# Patient Record
Sex: Female | Born: 1935
Health system: Southern US, Community
[De-identification: ages and names within clinical notes are randomized; demographics above are authoritative.]

## PROBLEM LIST (undated history)

## (undated) DIAGNOSIS — E079 Disorder of thyroid, unspecified: Secondary | ICD-10-CM

## (undated) DIAGNOSIS — F0390 Unspecified dementia without behavioral disturbance: Secondary | ICD-10-CM

## (undated) HISTORY — DX: Disorder of thyroid, unspecified: E07.9

## (undated) HISTORY — PX: ABDOMINAL HYSTERECTOMY: SHX81

---

## 2002-01-10 ENCOUNTER — Other Ambulatory Visit: Admission: RE | Admit: 2002-01-10 | Discharge: 2002-01-10 | Payer: Self-pay | Admitting: Family Medicine

## 2004-07-03 ENCOUNTER — Ambulatory Visit: Payer: Self-pay | Admitting: Family Medicine

## 2005-10-12 ENCOUNTER — Ambulatory Visit: Payer: Self-pay | Admitting: Family Medicine

## 2007-02-22 ENCOUNTER — Other Ambulatory Visit: Payer: Self-pay

## 2007-02-22 ENCOUNTER — Ambulatory Visit: Payer: Self-pay | Admitting: Specialist

## 2007-03-01 ENCOUNTER — Ambulatory Visit: Payer: Self-pay | Admitting: Specialist

## 2007-06-16 ENCOUNTER — Ambulatory Visit: Payer: Self-pay | Admitting: Specialist

## 2011-03-19 ENCOUNTER — Ambulatory Visit: Payer: Self-pay | Admitting: General Surgery

## 2011-07-01 ENCOUNTER — Emergency Department: Payer: Self-pay | Admitting: *Deleted

## 2011-07-01 LAB — MAGNESIUM: Magnesium: 2 mg/dL

## 2011-07-01 LAB — COMPREHENSIVE METABOLIC PANEL
Bilirubin,Total: 0.4 mg/dL (ref 0.2–1.0)
Calcium, Total: 9.2 mg/dL (ref 8.5–10.1)
Chloride: 104 mmol/L (ref 98–107)
Co2: 25 mmol/L (ref 21–32)
EGFR (African American): >60
EGFR (Non-African Amer.): >60
Potassium: 4.5 mmol/L (ref 3.5–5.1)
SGPT (ALT): 29 U/L
Total Protein: 7.5 g/dL (ref 6.4–8.2)

## 2011-07-01 LAB — CBC
HCT: 42.3 % (ref 35.0–47.0)
HGB: 14.1 g/dL (ref 12.0–16.0)
MCH: 31.2 pg (ref 26.0–34.0)
Platelet: 247 10*3/uL (ref 150–440)
RBC: 4.52 10*6/uL (ref 3.80–5.20)
RDW: 13.9 % (ref 11.5–14.5)
WBC: 7.8 10*3/uL (ref 3.6–11.0)

## 2011-07-01 LAB — TROPONIN I: Troponin-I: <0.02 ng/mL

## 2013-08-01 ENCOUNTER — Ambulatory Visit: Payer: Self-pay | Admitting: Adult Health

## 2015-06-03 DIAGNOSIS — M9903 Segmental and somatic dysfunction of lumbar region: Secondary | ICD-10-CM | POA: Diagnosis not present

## 2015-06-03 DIAGNOSIS — M542 Cervicalgia: Secondary | ICD-10-CM | POA: Diagnosis not present

## 2015-06-03 DIAGNOSIS — M9901 Segmental and somatic dysfunction of cervical region: Secondary | ICD-10-CM | POA: Diagnosis not present

## 2015-06-03 DIAGNOSIS — M9902 Segmental and somatic dysfunction of thoracic region: Secondary | ICD-10-CM | POA: Diagnosis not present

## 2015-06-03 DIAGNOSIS — M546 Pain in thoracic spine: Secondary | ICD-10-CM | POA: Diagnosis not present

## 2015-06-03 DIAGNOSIS — M545 Low back pain: Secondary | ICD-10-CM | POA: Diagnosis not present

## 2015-06-17 DIAGNOSIS — H353131 Nonexudative age-related macular degeneration, bilateral, early dry stage: Secondary | ICD-10-CM | POA: Diagnosis not present

## 2015-06-20 DIAGNOSIS — M9903 Segmental and somatic dysfunction of lumbar region: Secondary | ICD-10-CM | POA: Diagnosis not present

## 2015-06-20 DIAGNOSIS — M542 Cervicalgia: Secondary | ICD-10-CM | POA: Diagnosis not present

## 2015-06-20 DIAGNOSIS — M9901 Segmental and somatic dysfunction of cervical region: Secondary | ICD-10-CM | POA: Diagnosis not present

## 2015-06-20 DIAGNOSIS — M545 Low back pain: Secondary | ICD-10-CM | POA: Diagnosis not present

## 2015-06-20 DIAGNOSIS — M9902 Segmental and somatic dysfunction of thoracic region: Secondary | ICD-10-CM | POA: Diagnosis not present

## 2015-06-20 DIAGNOSIS — M546 Pain in thoracic spine: Secondary | ICD-10-CM | POA: Diagnosis not present

## 2015-10-07 DIAGNOSIS — R030 Elevated blood-pressure reading, without diagnosis of hypertension: Secondary | ICD-10-CM | POA: Diagnosis not present

## 2015-10-07 DIAGNOSIS — E039 Hypothyroidism, unspecified: Secondary | ICD-10-CM | POA: Diagnosis not present

## 2015-12-11 DIAGNOSIS — D692 Other nonthrombocytopenic purpura: Secondary | ICD-10-CM | POA: Diagnosis not present

## 2015-12-11 DIAGNOSIS — C44622 Squamous cell carcinoma of skin of right upper limb, including shoulder: Secondary | ICD-10-CM | POA: Diagnosis not present

## 2015-12-11 DIAGNOSIS — X32XXXA Exposure to sunlight, initial encounter: Secondary | ICD-10-CM | POA: Diagnosis not present

## 2015-12-11 DIAGNOSIS — L570 Actinic keratosis: Secondary | ICD-10-CM | POA: Diagnosis not present

## 2015-12-11 DIAGNOSIS — Z85828 Personal history of other malignant neoplasm of skin: Secondary | ICD-10-CM | POA: Diagnosis not present

## 2015-12-11 DIAGNOSIS — Z08 Encounter for follow-up examination after completed treatment for malignant neoplasm: Secondary | ICD-10-CM | POA: Diagnosis not present

## 2015-12-11 DIAGNOSIS — C44729 Squamous cell carcinoma of skin of left lower limb, including hip: Secondary | ICD-10-CM | POA: Diagnosis not present

## 2015-12-11 DIAGNOSIS — D485 Neoplasm of uncertain behavior of skin: Secondary | ICD-10-CM | POA: Diagnosis not present

## 2015-12-16 DIAGNOSIS — H353133 Nonexudative age-related macular degeneration, bilateral, advanced atrophic without subfoveal involvement: Secondary | ICD-10-CM | POA: Diagnosis not present

## 2016-02-13 DIAGNOSIS — L905 Scar conditions and fibrosis of skin: Secondary | ICD-10-CM | POA: Diagnosis not present

## 2016-02-13 DIAGNOSIS — C44622 Squamous cell carcinoma of skin of right upper limb, including shoulder: Secondary | ICD-10-CM | POA: Diagnosis not present

## 2016-02-13 DIAGNOSIS — D0461 Carcinoma in situ of skin of right upper limb, including shoulder: Secondary | ICD-10-CM | POA: Diagnosis not present

## 2016-03-02 DIAGNOSIS — C44729 Squamous cell carcinoma of skin of left lower limb, including hip: Secondary | ICD-10-CM | POA: Diagnosis not present

## 2016-03-02 DIAGNOSIS — L905 Scar conditions and fibrosis of skin: Secondary | ICD-10-CM | POA: Diagnosis not present

## 2016-03-22 DIAGNOSIS — E039 Hypothyroidism, unspecified: Secondary | ICD-10-CM | POA: Diagnosis not present

## 2016-03-22 DIAGNOSIS — M810 Age-related osteoporosis without current pathological fracture: Secondary | ICD-10-CM | POA: Diagnosis not present

## 2016-03-22 DIAGNOSIS — Z0001 Encounter for general adult medical examination with abnormal findings: Secondary | ICD-10-CM | POA: Diagnosis not present

## 2016-06-09 DIAGNOSIS — H353133 Nonexudative age-related macular degeneration, bilateral, advanced atrophic without subfoveal involvement: Secondary | ICD-10-CM | POA: Diagnosis not present

## 2016-06-29 DIAGNOSIS — E559 Vitamin D deficiency, unspecified: Secondary | ICD-10-CM | POA: Diagnosis not present

## 2016-06-29 DIAGNOSIS — E039 Hypothyroidism, unspecified: Secondary | ICD-10-CM | POA: Diagnosis not present

## 2016-06-29 DIAGNOSIS — Z0001 Encounter for general adult medical examination with abnormal findings: Secondary | ICD-10-CM | POA: Diagnosis not present

## 2016-10-05 DIAGNOSIS — R69 Illness, unspecified: Secondary | ICD-10-CM | POA: Diagnosis not present

## 2016-12-15 DIAGNOSIS — H353133 Nonexudative age-related macular degeneration, bilateral, advanced atrophic without subfoveal involvement: Secondary | ICD-10-CM | POA: Diagnosis not present

## 2017-03-24 DIAGNOSIS — D485 Neoplasm of uncertain behavior of skin: Secondary | ICD-10-CM | POA: Diagnosis not present

## 2017-03-24 DIAGNOSIS — E039 Hypothyroidism, unspecified: Secondary | ICD-10-CM | POA: Diagnosis not present

## 2017-03-24 DIAGNOSIS — Z0001 Encounter for general adult medical examination with abnormal findings: Secondary | ICD-10-CM | POA: Diagnosis not present

## 2017-06-20 DIAGNOSIS — H353211 Exudative age-related macular degeneration, right eye, with active choroidal neovascularization: Secondary | ICD-10-CM | POA: Diagnosis not present

## 2017-06-28 DIAGNOSIS — H43813 Vitreous degeneration, bilateral: Secondary | ICD-10-CM | POA: Diagnosis not present

## 2017-06-28 DIAGNOSIS — H353211 Exudative age-related macular degeneration, right eye, with active choroidal neovascularization: Secondary | ICD-10-CM | POA: Diagnosis not present

## 2017-07-05 ENCOUNTER — Other Ambulatory Visit: Payer: Self-pay | Admitting: Internal Medicine

## 2017-08-04 ENCOUNTER — Encounter: Payer: Self-pay | Admitting: Podiatry

## 2017-08-04 ENCOUNTER — Ambulatory Visit: Payer: Medicare HMO | Admitting: Podiatry

## 2017-08-04 DIAGNOSIS — M79675 Pain in left toe(s): Secondary | ICD-10-CM

## 2017-08-04 DIAGNOSIS — B351 Tinea unguium: Secondary | ICD-10-CM | POA: Diagnosis not present

## 2017-08-04 DIAGNOSIS — M79674 Pain in right toe(s): Secondary | ICD-10-CM

## 2017-08-04 NOTE — Progress Notes (Signed)
Complaint:  Visit Type: Patient presents  to my office for  preventative foot care services. Complaint: Patient states" my nails have grown long and thick and become painful to walk and wear shoes" . The patient presents for preventative foot care services. No changes to ROS  Podiatric Exam: Vascular: dorsalis pedis and posterior tibial pulses are weakly  palpable bilateral. Capillary return is immediate. Cold feet. Skin turgor WNL  Sensorium: Normal Semmes Weinstein monofilament test. Normal tactile sensation bilaterally. Nail Exam: Pt has thick disfigured discolored nails with subungual debris noted bilateral entire nail hallux through fifth toenails with her left hallux thickest. Ulcer Exam: There is no evidence of ulcer or pre-ulcerative changes or infection. Orthopedic Exam: Muscle tone and strength are WNL. No limitations in general ROM. No crepitus or effusions noted. Foot type and digits show no abnormalities. Bony prominences are unremarkable. Skin: No Porokeratosis. No infection or ulcers  Diagnosis:  Onychomycosis, , Pain in right toe, pain in left toes  Treatment & Plan Procedures and Treatment: Consent by patient was obtained for treatment procedures.   Debridement of mycotic and hypertrophic toenails, 1 through 5 bilateral and clearing of subungual debris. No ulceration, no infection noted.  Return Visit-Office Procedure: Patient instructed to return to the office for a follow up visit 3 months for continued evaluation and treatment.    Gardiner Barefoot DPM

## 2017-08-17 ENCOUNTER — Ambulatory Visit: Payer: Self-pay | Admitting: Podiatry

## 2017-09-13 DIAGNOSIS — H353211 Exudative age-related macular degeneration, right eye, with active choroidal neovascularization: Secondary | ICD-10-CM | POA: Diagnosis not present

## 2017-09-13 DIAGNOSIS — Z961 Presence of intraocular lens: Secondary | ICD-10-CM | POA: Diagnosis not present

## 2017-09-13 DIAGNOSIS — H353221 Exudative age-related macular degeneration, left eye, with active choroidal neovascularization: Secondary | ICD-10-CM | POA: Diagnosis not present

## 2017-10-17 DIAGNOSIS — H353211 Exudative age-related macular degeneration, right eye, with active choroidal neovascularization: Secondary | ICD-10-CM | POA: Diagnosis not present

## 2017-10-17 DIAGNOSIS — H353221 Exudative age-related macular degeneration, left eye, with active choroidal neovascularization: Secondary | ICD-10-CM | POA: Diagnosis not present

## 2017-10-24 DIAGNOSIS — R69 Illness, unspecified: Secondary | ICD-10-CM | POA: Diagnosis not present

## 2017-11-07 ENCOUNTER — Ambulatory Visit: Payer: Medicare HMO | Admitting: Podiatry

## 2017-11-22 DIAGNOSIS — H353221 Exudative age-related macular degeneration, left eye, with active choroidal neovascularization: Secondary | ICD-10-CM | POA: Diagnosis not present

## 2017-11-22 DIAGNOSIS — H353211 Exudative age-related macular degeneration, right eye, with active choroidal neovascularization: Secondary | ICD-10-CM | POA: Diagnosis not present

## 2017-12-19 ENCOUNTER — Telehealth: Payer: Self-pay

## 2017-12-19 DIAGNOSIS — M6281 Muscle weakness (generalized): Secondary | ICD-10-CM | POA: Diagnosis not present

## 2017-12-19 DIAGNOSIS — R2689 Other abnormalities of gait and mobility: Secondary | ICD-10-CM | POA: Diagnosis not present

## 2017-12-19 DIAGNOSIS — R278 Other lack of coordination: Secondary | ICD-10-CM | POA: Diagnosis not present

## 2017-12-19 NOTE — Telephone Encounter (Signed)
Physical theraphy called that pt bilateral leg swelling I gave tat for appt

## 2017-12-20 ENCOUNTER — Ambulatory Visit (INDEPENDENT_AMBULATORY_CARE_PROVIDER_SITE_OTHER): Payer: Medicare HMO | Admitting: Adult Health

## 2017-12-20 ENCOUNTER — Encounter: Payer: Self-pay | Admitting: Adult Health

## 2017-12-20 VITALS — BP 142/88 | HR 119 | Temp 97.9°F | Resp 16 | Ht 60.0 in | Wt 110.4 lb

## 2017-12-20 DIAGNOSIS — R600 Localized edema: Secondary | ICD-10-CM

## 2017-12-20 DIAGNOSIS — E039 Hypothyroidism, unspecified: Secondary | ICD-10-CM | POA: Diagnosis not present

## 2017-12-20 DIAGNOSIS — D692 Other nonthrombocytopenic purpura: Secondary | ICD-10-CM

## 2017-12-20 DIAGNOSIS — Z0001 Encounter for general adult medical examination with abnormal findings: Secondary | ICD-10-CM

## 2017-12-20 DIAGNOSIS — R0600 Dyspnea, unspecified: Secondary | ICD-10-CM

## 2017-12-20 DIAGNOSIS — R0609 Other forms of dyspnea: Secondary | ICD-10-CM

## 2017-12-20 NOTE — Progress Notes (Addendum)
[facility] [place], [place] [address]  Internal MEDICINE  Office Visit Note  Patient Name: Aimee Kirk  [id number]  [id number]  Date of Service: 02/07/2018  Chief Complaint  Patient presents with  . Edema     both legs , started in the last week, ankles have been swelling for about a month  . Shortness of Breath    palpitations     HPI Pt is here for a sick visit. Family in exam room.  They reports that physical therapy was working with her a few days ago.  They noticed bilateral feet/ankle swelling. She does admit to mild  chest pressure She has sob with exertion, heart rate is elevated here in the office   Current Medication:  Outpatient Encounter Medications as of 12/20/2017  Medication Sig  . ARMOUR THYROID 60 MG tablet TAKE 1 TABLET DAILY 1 HOUR BEFORE BREAKFAST  . Multiple Vitamin (MULTIVITAMIN) tablet Take 1 tablet by mouth daily.   No facility-administered encounter medications on file as of 12/20/2017.     Medical History: Past Medical History:  Diagnosis Date  . Thyroid disease     Vital Signs: BP (!) 142/88   Pulse (!) 119   Temp 97.9 F (36.6 C)   Resp 16   Ht 5' (1.524 m)   Wt 110 lb 6.4 oz (50.1 kg)   SpO2 95%   BMI 21.56 kg/m    Review of Systems  Constitutional: Negative for chills, fatigue and unexpected weight change.  HENT: Negative for congestion, rhinorrhea, sneezing and sore throat.   Eyes: Negative for photophobia, pain and redness.  Respiratory: Negative for cough, chest tightness and shortness of breath.   Cardiovascular: Negative for chest pain and palpitations.  Gastrointestinal: Negative for abdominal pain, constipation, diarrhea, nausea and vomiting.  Endocrine: Negative.   Genitourinary: Negative for dysuria and frequency.  Musculoskeletal: Negative for arthralgias, back pain, joint swelling and neck pain.  Skin: Negative for rash.       Swelling to bilateral lower extremities.    Allergic/Immunologic: Negative.   Neurological: Negative for tremors and numbness.  Hematological: Negative for adenopathy. Does not bruise/bleed easily.  Psychiatric/Behavioral: Negative for behavioral problems and sleep disturbance. The patient is not nervous/anxious.     Physical Exam  Constitutional: She is oriented to person, place, and time. She appears well-developed and well-nourished. No distress.  HENT:  Head: Normocephalic and atraumatic.  Mouth/Throat: Oropharynx is clear and moist. No oropharyngeal exudate.  Eyes: Pupils are equal, round, and reactive to light. EOM are normal.  Neck: Normal range of motion. Neck supple. No JVD present. No tracheal deviation present. No thyromegaly present.  Cardiovascular: Regular rhythm and normal heart sounds. Exam reveals no gallop and no friction rub.  No murmur heard. TACHYCARDIA  Pulmonary/Chest: Effort normal and breath sounds normal. No respiratory distress. She has no wheezes. She has no rales. She exhibits no tenderness.  Abdominal: Soft. There is no tenderness. There is no guarding.  Musculoskeletal: Normal range of motion.  Lymphadenopathy:    She has no cervical adenopathy.  Neurological: She is alert and oriented to person, place, and time. No cranial nerve deficit.  Skin: Skin is warm and dry. She is not diaphoretic.  Senile Purpura noted to all extremities.  1+ Pitting edema noted to BLE.   Psychiatric: She has a normal mood and affect. Her behavior is normal. Judgment and thought content normal.  Nursing note and vitals reviewed.  Assessment/Plan: 1. Pedal edema Keep  feet elevated when sitting.  Use compression hose if possible.pt has tachycardia refer to cardiology if needed.   2. Senile purpura ([place]) Protect skin as much as possible.    3. Hypothyroidism, unspecified type - TSH - T4, free  Labs ordered at patient request.  - CBC with Differential/Platelet - Lipid Panel With LDL/HDL Ratio - Comprehensive  metabolic panel  5. Dyspnea on exertion - Pt has tachycardia, needs evaluation of LV function, will order  ECHOCARDIOGRAM COMPLETE; Future General Counseling: Eileen verbalizes understanding of the findings of todays visit and agrees with plan of treatment. I have discussed any further diagnostic evaluation that may be needed or ordered today. We also reviewed her medications today. she has been encouraged to call the office with any questions or concerns that should arise related to todays visit.   Orders Placed This Encounter  Procedures  . CBC with Differential/Platelet  . Lipid Panel With LDL/HDL Ratio  . TSH  . T4, free  . Comprehensive metabolic panel  . ECHOCARDIOGRAM COMPLETE    Time spent: 25 Minutes  This patient was seen by Orson Gear AGNP-C in Collaboration with Dr Lavera Guise as a part of collaborative care agreement

## 2017-12-20 NOTE — Patient Instructions (Signed)
Edema Edema is when you have too much fluid in your body or under your skin. Edema may make your legs, feet, and ankles swell up. Swelling is also common in looser tissues, like around your eyes. This is a common condition. It gets more common as you get older. There are many possible causes of edema. Eating too much salt (sodium) and being on your feet or sitting for a long time can cause edema in your legs, feet, and ankles. Hot weather may make edema worse. Edema is usually painless. Your skin may look swollen or shiny. Follow these instructions at home:  Keep the swollen body part raised (elevated) above the level of your heart when you are sitting or lying down.  Do not sit still or stand for a long time.  Do not wear tight clothes. Do not wear garters on your upper legs.  Exercise your legs. This can help the swelling go down.  Wear elastic bandages or support stockings as told by your doctor.  Eat a low-salt (low-sodium) diet to reduce fluid as told by your doctor.  Depending on the cause of your swelling, you may need to limit how much fluid you drink (fluid restriction).  Take over-the-counter and prescription medicines only as told by your doctor. Contact a doctor if:  Treatment is not working.  You have heart, liver, or kidney disease and have symptoms of edema.  You have sudden and unexplained weight gain. Get help right away if:  You have shortness of breath or chest pain.  You cannot breathe when you lie down.  You have pain, redness, or warmth in the swollen areas.  You have heart, liver, or kidney disease and get edema all of a sudden.  You have a fever and your symptoms get worse all of a sudden. Summary  Edema is when you have too much fluid in your body or under your skin.  Edema may make your legs, feet, and ankles swell up. Swelling is also common in looser tissues, like around your eyes.  Raise (elevate) the swollen body part above the level of your  heart when you are sitting or lying down.  Follow your doctor's instructions about diet and how much fluid you can drink (fluid restriction). This information is not intended to replace advice given to you by your health care provider. Make sure you discuss any questions you have with your health care provider. Document Released: 10/20/2007 Document Revised: 05/21/2016 Document Reviewed: 05/21/2016 Elsevier Interactive Patient Education  2017 Elsevier Inc.  

## 2017-12-21 ENCOUNTER — Telehealth: Payer: Self-pay

## 2017-12-21 DIAGNOSIS — Z13220 Encounter for screening for lipoid disorders: Secondary | ICD-10-CM | POA: Diagnosis not present

## 2017-12-21 DIAGNOSIS — R5381 Other malaise: Secondary | ICD-10-CM | POA: Diagnosis not present

## 2017-12-21 DIAGNOSIS — M6281 Muscle weakness (generalized): Secondary | ICD-10-CM | POA: Diagnosis not present

## 2017-12-21 DIAGNOSIS — R2689 Other abnormalities of gait and mobility: Secondary | ICD-10-CM | POA: Diagnosis not present

## 2017-12-21 DIAGNOSIS — E079 Disorder of thyroid, unspecified: Secondary | ICD-10-CM | POA: Diagnosis not present

## 2017-12-21 DIAGNOSIS — E756 Lipid storage disorder, unspecified: Secondary | ICD-10-CM | POA: Diagnosis not present

## 2017-12-21 DIAGNOSIS — Z0001 Encounter for general adult medical examination with abnormal findings: Secondary | ICD-10-CM | POA: Diagnosis not present

## 2017-12-21 DIAGNOSIS — R5383 Other fatigue: Secondary | ICD-10-CM | POA: Diagnosis not present

## 2017-12-21 DIAGNOSIS — R278 Other lack of coordination: Secondary | ICD-10-CM | POA: Diagnosis not present

## 2017-12-21 NOTE — Telephone Encounter (Signed)
PHYSICAL THERAPY ([place] [phone]) PT HEART RATE IS HIGH 101 AS PER [facility] HOLD FOR NOW FOR PHYSICAL THERAPY NOW UNTIL WE GETS ECHO RESULT BACK AND I ADVISED JENNIFER THAT

## 2017-12-22 ENCOUNTER — Encounter: Payer: Self-pay | Admitting: Adult Health

## 2017-12-22 LAB — CBC WITH DIFFERENTIAL/PLATELET
BASOS ABS: 0 10*3/uL (ref 0.0–0.2)
Basos: 0 %
EOS (ABSOLUTE): 0 10*3/uL (ref 0.0–0.4)
Eos: 1 %
Hematocrit: 37.7 % (ref 34.0–46.6)
Hemoglobin: 12.4 g/dL (ref 11.1–15.9)
Immature Grans (Abs): 0 10*3/uL (ref 0.0–0.1)
Immature Granulocytes: 0 %
LYMPHS ABS: 1 10*3/uL (ref 0.7–3.1)
Lymphs: 12 %
MCH: 30.8 pg (ref 26.6–33.0)
MCHC: 32.9 g/dL (ref 31.5–35.7)
MCV: 94 fL (ref 79–97)
Monocytes Absolute: 0.6 10*3/uL (ref 0.1–0.9)
Monocytes: 7 %
NEUTROS ABS: 6.7 10*3/uL (ref 1.4–7.0)
Neutrophils: 80 %
PLATELETS: 357 10*3/uL (ref 150–450)
RBC: 4.02 x10E6/uL (ref 3.77–5.28)
RDW: 14.1 % (ref 12.3–15.4)
WBC: 8.5 10*3/uL (ref 3.4–10.8)

## 2017-12-22 LAB — COMPREHENSIVE METABOLIC PANEL
ALK PHOS: 74 IU/L (ref 39–117)
ALT: 18 IU/L (ref 0–32)
AST: 21 IU/L (ref 0–40)
Albumin/Globulin Ratio: 1.9 (ref 1.2–2.2)
Albumin: 4.2 g/dL (ref 3.2–4.6)
BUN/Creatinine Ratio: 20 (ref 12–28)
BUN: 19 mg/dL (ref 10–36)
Bilirubin Total: 0.4 mg/dL (ref 0.0–1.2)
CALCIUM: 9.4 mg/dL (ref 8.7–10.3)
CHLORIDE: 104 mmol/L (ref 96–106)
CO2: 23 mmol/L (ref 20–29)
Creatinine, Ser: 0.95 mg/dL (ref 0.57–1.00)
GFR calc Af Amer: 60 mL/min/{1.73_m2} (ref >59)
GFR, EST NON AFRICAN AMERICAN: 52 mL/min/{1.73_m2} — AB (ref >59)
Globulin, Total: 2.2 g/dL (ref 1.5–4.5)
Glucose: 132 mg/dL — ABNORMAL HIGH (ref 65–99)
POTASSIUM: 4.1 mmol/L (ref 3.5–5.2)
Sodium: 146 mmol/L — ABNORMAL HIGH (ref 134–144)
Total Protein: 6.4 g/dL (ref 6.0–8.5)

## 2017-12-22 LAB — TSH: TSH: 4.57 u[IU]/mL — AB (ref 0.450–4.500)

## 2017-12-22 LAB — LIPID PANEL WITH LDL/HDL RATIO
CHOLESTEROL TOTAL: 248 mg/dL — AB (ref 100–199)
HDL: 94 mg/dL (ref >39)
LDL Calculated: 133 mg/dL — ABNORMAL HIGH (ref 0–99)
LDl/HDL Ratio: 1.4 ratio (ref 0.0–3.2)
Triglycerides: 107 mg/dL (ref 0–149)
VLDL Cholesterol Cal: 21 mg/dL (ref 5–40)

## 2017-12-22 LAB — T4, FREE: FREE T4: 1.01 ng/dL (ref 0.82–1.77)

## 2017-12-30 ENCOUNTER — Other Ambulatory Visit: Payer: Self-pay

## 2018-01-03 DIAGNOSIS — H353221 Exudative age-related macular degeneration, left eye, with active choroidal neovascularization: Secondary | ICD-10-CM | POA: Diagnosis not present

## 2018-01-03 DIAGNOSIS — H353211 Exudative age-related macular degeneration, right eye, with active choroidal neovascularization: Secondary | ICD-10-CM | POA: Diagnosis not present

## 2018-01-06 ENCOUNTER — Ambulatory Visit: Payer: Medicare HMO

## 2018-01-06 DIAGNOSIS — R000 Tachycardia, unspecified: Secondary | ICD-10-CM | POA: Diagnosis not present

## 2018-01-06 DIAGNOSIS — R600 Localized edema: Secondary | ICD-10-CM

## 2018-01-06 DIAGNOSIS — R609 Edema, unspecified: Secondary | ICD-10-CM | POA: Diagnosis not present

## 2018-01-06 DIAGNOSIS — R0602 Shortness of breath: Secondary | ICD-10-CM | POA: Diagnosis not present

## 2018-01-09 ENCOUNTER — Encounter: Payer: Self-pay | Admitting: Podiatry

## 2018-01-09 ENCOUNTER — Ambulatory Visit: Payer: Medicare HMO | Admitting: Podiatry

## 2018-01-09 DIAGNOSIS — M79675 Pain in left toe(s): Secondary | ICD-10-CM

## 2018-01-09 DIAGNOSIS — M79674 Pain in right toe(s): Secondary | ICD-10-CM | POA: Diagnosis not present

## 2018-01-09 DIAGNOSIS — Q828 Other specified congenital malformations of skin: Secondary | ICD-10-CM

## 2018-01-09 DIAGNOSIS — B351 Tinea unguium: Secondary | ICD-10-CM

## 2018-01-09 NOTE — Progress Notes (Signed)
Complaint:  Visit Type: Patient presents  to my office for  preventative foot care services. Complaint: Patient states" my nails have grown long and thick and become painful to walk and wear shoes" . The patient presents for preventative foot care services. No changes to ROS  Podiatric Exam: Vascular: dorsalis pedis and posterior tibial pulses are weakly  palpable bilateral. Capillary return is immediate. Cold feet. Skin turgor WNL  Sensorium: Normal Semmes Weinstein monofilament test. Normal tactile sensation bilaterally. Nail Exam: Pt has thick disfigured discolored nails with subungual debris noted bilateral entire nail hallux through fifth toenails with her left hallux thickest. Ulcer Exam: There is no evidence of ulcer or pre-ulcerative changes or infection. Orthopedic Exam: Muscle tone and strength are WNL. No limitations in general ROM. No crepitus or effusions noted. Foot type and digits show no abnormalities. Bony prominences are unremarkable. Skin:  Porokeratosis  Sub 1 right foot. No infection or ulcers  Diagnosis:  Onychomycosis, , Pain in right toe, pain in left toes,  Porokeratosis sub 1 right foot  Treatment & Plan Procedures and Treatment: Consent by patient was obtained for treatment procedures.   Debridement of mycotic and hypertrophic toenails, 1 through 5 bilateral and clearing of subungual debris. No ulceration, no infection noted. Debride porokeratosis.  Padding.   Return Visit-Office Procedure: Patient instructed to return to the office for a follow up visit 3 months for continued evaluation and treatment.    Gardiner Barefoot DPM

## 2018-01-10 ENCOUNTER — Ambulatory Visit: Payer: Self-pay | Admitting: Adult Health

## 2018-01-11 ENCOUNTER — Encounter: Payer: Self-pay | Admitting: Adult Health

## 2018-01-11 ENCOUNTER — Ambulatory Visit: Payer: Medicare HMO | Admitting: Adult Health

## 2018-01-11 VITALS — BP 140/70 | HR 116 | Resp 16 | Ht 60.0 in | Wt 112.0 lb

## 2018-01-11 DIAGNOSIS — R000 Tachycardia, unspecified: Secondary | ICD-10-CM | POA: Diagnosis not present

## 2018-01-11 DIAGNOSIS — D692 Other nonthrombocytopenic purpura: Secondary | ICD-10-CM | POA: Diagnosis not present

## 2018-01-11 DIAGNOSIS — E039 Hypothyroidism, unspecified: Secondary | ICD-10-CM | POA: Diagnosis not present

## 2018-01-11 DIAGNOSIS — I5189 Other ill-defined heart diseases: Secondary | ICD-10-CM

## 2018-01-11 DIAGNOSIS — R600 Localized edema: Secondary | ICD-10-CM

## 2018-01-11 DIAGNOSIS — R7309 Other abnormal glucose: Secondary | ICD-10-CM

## 2018-01-11 LAB — POCT GLYCOSYLATED HEMOGLOBIN (HGB A1C): Hemoglobin A1C: 5.1 % (ref 4.0–5.6)

## 2018-01-11 NOTE — Patient Instructions (Signed)
Hypothyroidism Hypothyroidism is a disorder of the thyroid. The thyroid is a large gland that is located in the lower front of the neck. The thyroid releases hormones that control how the body works. With hypothyroidism, the thyroid does not make enough of these hormones. What are the causes? Causes of hypothyroidism may include:  Viral infections.  Pregnancy.  Your own defense system (immune system) attacking your thyroid.  Certain medicines.  Birth defects.  Past radiation treatments to your head or neck.  Past treatment with radioactive iodine.  Past surgical removal of part or all of your thyroid.  Problems with the gland that is located in the center of your brain (pituitary).  What are the signs or symptoms? Signs and symptoms of hypothyroidism may include:  Feeling as though you have no energy (lethargy).  Inability to tolerate cold.  Weight gain that is not explained by a change in diet or exercise habits.  Dry skin.  Coarse hair.  Menstrual irregularity.  Slowing of thought processes.  Constipation.  Sadness or depression.  How is this diagnosed? Your health care provider may diagnose hypothyroidism with blood tests and ultrasound tests. How is this treated? Hypothyroidism is treated with medicine that replaces the hormones that your body does not make. After you begin treatment, it may take several weeks for symptoms to go away. Follow these instructions at home:  Take medicines only as directed by your health care provider.  If you start taking any new medicines, tell your health care provider.  Keep all follow-up visits as directed by your health care provider. This is important. As your condition improves, your dosage needs may change. You will need to have blood tests regularly so that your health care provider can watch your condition. Contact a health care provider if:  Your symptoms do not get better with treatment.  You are taking thyroid  replacement medicine and: ? You sweat excessively. ? You have tremors. ? You feel anxious. ? You lose weight rapidly. ? You cannot tolerate heat. ? You have emotional swings. ? You have diarrhea. ? You feel weak. Get help right away if:  You develop chest pain.  You develop an irregular heartbeat.  You develop a rapid heartbeat. This information is not intended to replace advice given to you by your health care provider. Make sure you discuss any questions you have with your health care provider. Document Released: 05/03/2005 Document Revised: 10/09/2015 Document Reviewed: 09/18/2013 Elsevier Interactive Patient Education  2018 Elsevier Inc.  

## 2018-01-11 NOTE — Progress Notes (Signed)
[facility] [place], [place] [address]  Internal MEDICINE  Office Visit Note  Patient Name: Aimee Kirk  [id number]  [id number]  Date of Service: 01/29/2018  Chief Complaint  Patient presents with  . Follow-up    Echo and lab result  . Leg Swelling    HPI Pt here for follow upon ECHO and labs.  Echo shows Normal EF, moderate to severe diastolic dysfunction , see report for complete read.  She had continued to have BLE edema however it has gotten somewhat better. She denies chest pain, sob or palpitations.  She is pleasant, sitting up on exam table, with family in exam room.     Current Medication: Outpatient Encounter Medications as of 01/11/2018  Medication Sig  . ARMOUR THYROID 60 MG tablet TAKE 1 TABLET DAILY 1 HOUR BEFORE BREAKFAST  . Multiple Vitamin (MULTIVITAMIN) tablet Take 1 tablet by mouth daily.   No facility-administered encounter medications on file as of 01/11/2018.     Surgical History: Past Surgical History:  Procedure Laterality Date  . ABDOMINAL HYSTERECTOMY      Medical History: Past Medical History:  Diagnosis Date  . Thyroid disease     Family History: Family History  Problem Relation Age of Onset  . Arthritis Mother   . Heart disease Father     Social History   Socioeconomic History  . Marital status: Unknown    Spouse name: Not on file  . Number of children: Not on file  . Years of education: Not on file  . Highest education level: Not on file  Occupational History  . Not on file  Social Needs  . Financial resource strain: Not on file  . Food insecurity:    Worry: Not on file    Inability: Not on file  . Transportation needs:    Medical: Not on file    Non-medical: Not on file  Tobacco Use  . Smoking status: Never Smoker  . Smokeless tobacco: Never Used  Substance and Sexual Activity  . Alcohol use: Never    Frequency: Never  . Drug use: Never  . Sexual activity: Not on file  Lifestyle  .  Physical activity:    Days per week: Not on file    Minutes per session: Not on file  . Stress: Not on file  Relationships  . Social connections:    Talks on phone: Not on file    Gets together: Not on file    Attends religious service: Not on file    Active member of club or organization: Not on file    Attends meetings of clubs or organizations: Not on file    Relationship status: Not on file  . Intimate partner violence:    Fear of current or ex partner: Not on file    Emotionally abused: Not on file    Physically abused: Not on file    Forced sexual activity: Not on file  Other Topics Concern  . Not on file  Social History Narrative  . Not on file    Review of Systems  Constitutional: Negative for chills, fatigue and unexpected weight change.  HENT: Negative for congestion, rhinorrhea, sneezing and sore throat.   Eyes: Negative for photophobia, pain and redness.  Respiratory: Negative for cough, chest tightness and shortness of breath.   Cardiovascular: Negative for chest pain and palpitations.  Gastrointestinal: Negative for abdominal pain, constipation, diarrhea, nausea and vomiting.  Endocrine: Negative.   Genitourinary: Negative for  dysuria and frequency.  Musculoskeletal: Negative for arthralgias, back pain, joint swelling and neck pain.  Skin: Negative for rash.  Allergic/Immunologic: Negative.   Neurological: Negative for tremors and numbness.  Hematological: Negative for adenopathy. Does not bruise/bleed easily.  Psychiatric/Behavioral: Negative for behavioral problems and sleep disturbance. The patient is not nervous/anxious.    Vital Signs: BP 140/70   Pulse (!) 116   Resp 16   Ht 5' (1.524 m)   Wt 112 lb (50.8 kg)   SpO2 97%   BMI 21.87 kg/m   Physical Exam  Constitutional: She is oriented to person, place, and time. She appears well-developed and well-nourished. No distress.  HENT:  Head: Normocephalic and atraumatic.  Mouth/Throat: Oropharynx is  clear and moist. No oropharyngeal exudate.  Eyes: Pupils are equal, round, and reactive to light. EOM are normal.  Neck: Normal range of motion. Neck supple. No JVD present. No tracheal deviation present. No thyromegaly present.  Cardiovascular: Normal rate, regular rhythm and normal heart sounds. Exam reveals no gallop and no friction rub.  No murmur heard. Pulmonary/Chest: Effort normal and breath sounds normal. No respiratory distress. She has no wheezes. She has no rales. She exhibits no tenderness.  Abdominal: Soft. There is no tenderness. There is no guarding.  Musculoskeletal: Normal range of motion.  Lymphadenopathy:    She has no cervical adenopathy.  Neurological: She is alert and oriented to person, place, and time. No cranial nerve deficit.  Skin: Skin is warm and dry. She is not diaphoretic.  2+ BLE edema noted.   Psychiatric: She has a normal mood and affect. Her behavior is normal. Judgment and thought content normal.  Nursing note and vitals reviewed.  Assessment/Plan: 1. Hypothyroidism, unspecified type Informed at this visit patient has had scattered complaince with Armourthyroid.  She was most likely not taking medications at time of blood draw. Family would like to wait a few months without taking it to re-draw TSH, FT4  To evaluate need for armourthyroid or synthroid.  Lab slip for TSH and fT4 given to patient so she can have new level draw before November physical.    2. Diastolic dysfunction Diastolic dysfuntion Moderate to severe noted on Echo.  Family declined cardiology referral, goals of care discussed.   3. Pedal edema 2+  Pitting edema noted to BLE.  Encouraged pt to elevate feet, and not stand for extended periods of time.   4. Senile purpura ([place]) Noted, non medical management.  Family is taking care of skin.   5. Abnormal glucose - POCT HgB A1C ( within range)   6. Tachycardia - Might be related to anxiety, will monitor at home, low dose b blocker in  future   General Counseling: Kasara verbalizes understanding of the findings of todays visit and agrees with plan of treatment. I have discussed any further diagnostic evaluation that may be needed or ordered today. We also reviewed her medications today. she has been encouraged to call the office with any questions or concerns that should arise related to todays visit.    Orders Placed This Encounter  Procedures  . POCT HgB A1C    Time spent: 25 Minutes   This patient was seen by Orson Gear AGNP-C in Collaboration with Dr Lavera Guise as a part of collaborative care agreement    Dr Lavera Guise Internal medicine

## 2018-02-23 DIAGNOSIS — R413 Other amnesia: Secondary | ICD-10-CM | POA: Diagnosis not present

## 2018-02-23 DIAGNOSIS — Q845 Enlarged and hypertrophic nails: Secondary | ICD-10-CM | POA: Diagnosis not present

## 2018-02-23 DIAGNOSIS — H3530 Unspecified macular degeneration: Secondary | ICD-10-CM | POA: Diagnosis not present

## 2018-02-23 DIAGNOSIS — R269 Unspecified abnormalities of gait and mobility: Secondary | ICD-10-CM | POA: Diagnosis not present

## 2018-02-28 DIAGNOSIS — H353211 Exudative age-related macular degeneration, right eye, with active choroidal neovascularization: Secondary | ICD-10-CM | POA: Diagnosis not present

## 2018-02-28 DIAGNOSIS — H353221 Exudative age-related macular degeneration, left eye, with active choroidal neovascularization: Secondary | ICD-10-CM | POA: Diagnosis not present

## 2018-02-28 DIAGNOSIS — H43813 Vitreous degeneration, bilateral: Secondary | ICD-10-CM | POA: Diagnosis not present

## 2018-03-02 DIAGNOSIS — Z79899 Other long term (current) drug therapy: Secondary | ICD-10-CM | POA: Diagnosis not present

## 2018-03-27 ENCOUNTER — Ambulatory Visit: Payer: Self-pay | Admitting: Nurse Practitioner

## 2018-04-10 ENCOUNTER — Ambulatory Visit: Payer: Medicare HMO | Admitting: Podiatry

## 2018-05-02 DIAGNOSIS — H353211 Exudative age-related macular degeneration, right eye, with active choroidal neovascularization: Secondary | ICD-10-CM | POA: Diagnosis not present

## 2018-05-02 DIAGNOSIS — H353221 Exudative age-related macular degeneration, left eye, with active choroidal neovascularization: Secondary | ICD-10-CM | POA: Diagnosis not present

## 2018-06-27 DIAGNOSIS — I739 Peripheral vascular disease, unspecified: Secondary | ICD-10-CM | POA: Diagnosis not present

## 2018-06-27 DIAGNOSIS — B351 Tinea unguium: Secondary | ICD-10-CM | POA: Diagnosis not present

## 2018-06-29 DIAGNOSIS — Z79899 Other long term (current) drug therapy: Secondary | ICD-10-CM | POA: Diagnosis not present

## 2018-06-29 DIAGNOSIS — R609 Edema, unspecified: Secondary | ICD-10-CM | POA: Diagnosis not present

## 2018-07-03 DIAGNOSIS — L03116 Cellulitis of left lower limb: Secondary | ICD-10-CM | POA: Diagnosis not present

## 2018-07-03 DIAGNOSIS — I5032 Chronic diastolic (congestive) heart failure: Secondary | ICD-10-CM | POA: Diagnosis not present

## 2018-07-03 DIAGNOSIS — R600 Localized edema: Secondary | ICD-10-CM | POA: Diagnosis not present

## 2018-07-06 DIAGNOSIS — R413 Other amnesia: Secondary | ICD-10-CM | POA: Diagnosis not present

## 2018-07-06 DIAGNOSIS — I5032 Chronic diastolic (congestive) heart failure: Secondary | ICD-10-CM | POA: Diagnosis not present

## 2018-07-06 DIAGNOSIS — H3530 Unspecified macular degeneration: Secondary | ICD-10-CM | POA: Diagnosis not present

## 2018-07-06 DIAGNOSIS — L03116 Cellulitis of left lower limb: Secondary | ICD-10-CM | POA: Diagnosis not present

## 2018-07-06 DIAGNOSIS — E039 Hypothyroidism, unspecified: Secondary | ICD-10-CM | POA: Diagnosis not present

## 2018-07-11 DIAGNOSIS — H43813 Vitreous degeneration, bilateral: Secondary | ICD-10-CM | POA: Diagnosis not present

## 2018-07-11 DIAGNOSIS — H353221 Exudative age-related macular degeneration, left eye, with active choroidal neovascularization: Secondary | ICD-10-CM | POA: Diagnosis not present

## 2018-07-11 DIAGNOSIS — H353211 Exudative age-related macular degeneration, right eye, with active choroidal neovascularization: Secondary | ICD-10-CM | POA: Diagnosis not present

## 2018-07-13 DIAGNOSIS — Z79899 Other long term (current) drug therapy: Secondary | ICD-10-CM | POA: Diagnosis not present

## 2018-08-31 DIAGNOSIS — E039 Hypothyroidism, unspecified: Secondary | ICD-10-CM | POA: Diagnosis not present

## 2018-09-02 DIAGNOSIS — I739 Peripheral vascular disease, unspecified: Secondary | ICD-10-CM | POA: Diagnosis not present

## 2018-09-02 DIAGNOSIS — L84 Corns and callosities: Secondary | ICD-10-CM | POA: Diagnosis not present

## 2018-09-02 DIAGNOSIS — B351 Tinea unguium: Secondary | ICD-10-CM | POA: Diagnosis not present

## 2018-09-04 DIAGNOSIS — E039 Hypothyroidism, unspecified: Secondary | ICD-10-CM | POA: Diagnosis not present

## 2018-10-03 DIAGNOSIS — H353211 Exudative age-related macular degeneration, right eye, with active choroidal neovascularization: Secondary | ICD-10-CM | POA: Diagnosis not present

## 2018-10-03 DIAGNOSIS — H353221 Exudative age-related macular degeneration, left eye, with active choroidal neovascularization: Secondary | ICD-10-CM | POA: Diagnosis not present

## 2018-10-31 DIAGNOSIS — E039 Hypothyroidism, unspecified: Secondary | ICD-10-CM | POA: Diagnosis not present

## 2018-10-31 DIAGNOSIS — Z79899 Other long term (current) drug therapy: Secondary | ICD-10-CM | POA: Diagnosis not present

## 2018-11-21 DIAGNOSIS — I739 Peripheral vascular disease, unspecified: Secondary | ICD-10-CM | POA: Diagnosis not present

## 2018-11-21 DIAGNOSIS — B351 Tinea unguium: Secondary | ICD-10-CM | POA: Diagnosis not present

## 2019-01-11 DIAGNOSIS — M6281 Muscle weakness (generalized): Secondary | ICD-10-CM | POA: Diagnosis not present

## 2019-01-11 DIAGNOSIS — E039 Hypothyroidism, unspecified: Secondary | ICD-10-CM | POA: Diagnosis not present

## 2019-01-11 DIAGNOSIS — H3530 Unspecified macular degeneration: Secondary | ICD-10-CM | POA: Diagnosis not present

## 2019-01-11 DIAGNOSIS — E785 Hyperlipidemia, unspecified: Secondary | ICD-10-CM | POA: Diagnosis not present

## 2019-01-11 DIAGNOSIS — I5030 Unspecified diastolic (congestive) heart failure: Secondary | ICD-10-CM | POA: Diagnosis not present

## 2019-01-11 DIAGNOSIS — R413 Other amnesia: Secondary | ICD-10-CM | POA: Diagnosis not present

## 2019-01-11 DIAGNOSIS — R488 Other symbolic dysfunctions: Secondary | ICD-10-CM | POA: Diagnosis not present

## 2019-01-11 DIAGNOSIS — R269 Unspecified abnormalities of gait and mobility: Secondary | ICD-10-CM | POA: Diagnosis not present

## 2019-01-15 DIAGNOSIS — R488 Other symbolic dysfunctions: Secondary | ICD-10-CM | POA: Diagnosis not present

## 2019-01-15 DIAGNOSIS — M6281 Muscle weakness (generalized): Secondary | ICD-10-CM | POA: Diagnosis not present

## 2019-01-16 DIAGNOSIS — M6281 Muscle weakness (generalized): Secondary | ICD-10-CM | POA: Diagnosis not present

## 2019-01-16 DIAGNOSIS — R2689 Other abnormalities of gait and mobility: Secondary | ICD-10-CM | POA: Diagnosis not present

## 2019-01-16 DIAGNOSIS — H353211 Exudative age-related macular degeneration, right eye, with active choroidal neovascularization: Secondary | ICD-10-CM | POA: Diagnosis not present

## 2019-01-16 DIAGNOSIS — R488 Other symbolic dysfunctions: Secondary | ICD-10-CM | POA: Diagnosis not present

## 2019-01-16 DIAGNOSIS — H353221 Exudative age-related macular degeneration, left eye, with active choroidal neovascularization: Secondary | ICD-10-CM | POA: Diagnosis not present

## 2019-01-16 DIAGNOSIS — R2681 Unsteadiness on feet: Secondary | ICD-10-CM | POA: Diagnosis not present

## 2019-01-17 DIAGNOSIS — R2681 Unsteadiness on feet: Secondary | ICD-10-CM | POA: Diagnosis not present

## 2019-01-17 DIAGNOSIS — R2689 Other abnormalities of gait and mobility: Secondary | ICD-10-CM | POA: Diagnosis not present

## 2019-01-17 DIAGNOSIS — R488 Other symbolic dysfunctions: Secondary | ICD-10-CM | POA: Diagnosis not present

## 2019-01-17 DIAGNOSIS — M6281 Muscle weakness (generalized): Secondary | ICD-10-CM | POA: Diagnosis not present

## 2019-01-18 DIAGNOSIS — R2689 Other abnormalities of gait and mobility: Secondary | ICD-10-CM | POA: Diagnosis not present

## 2019-01-18 DIAGNOSIS — R488 Other symbolic dysfunctions: Secondary | ICD-10-CM | POA: Diagnosis not present

## 2019-01-18 DIAGNOSIS — R2681 Unsteadiness on feet: Secondary | ICD-10-CM | POA: Diagnosis not present

## 2019-01-18 DIAGNOSIS — M6281 Muscle weakness (generalized): Secondary | ICD-10-CM | POA: Diagnosis not present

## 2019-01-19 DIAGNOSIS — R488 Other symbolic dysfunctions: Secondary | ICD-10-CM | POA: Diagnosis not present

## 2019-01-19 DIAGNOSIS — R2681 Unsteadiness on feet: Secondary | ICD-10-CM | POA: Diagnosis not present

## 2019-01-19 DIAGNOSIS — R2689 Other abnormalities of gait and mobility: Secondary | ICD-10-CM | POA: Diagnosis not present

## 2019-01-19 DIAGNOSIS — M6281 Muscle weakness (generalized): Secondary | ICD-10-CM | POA: Diagnosis not present

## 2019-01-20 DIAGNOSIS — R2681 Unsteadiness on feet: Secondary | ICD-10-CM | POA: Diagnosis not present

## 2019-01-20 DIAGNOSIS — R2689 Other abnormalities of gait and mobility: Secondary | ICD-10-CM | POA: Diagnosis not present

## 2019-01-20 DIAGNOSIS — M6281 Muscle weakness (generalized): Secondary | ICD-10-CM | POA: Diagnosis not present

## 2019-01-20 DIAGNOSIS — R488 Other symbolic dysfunctions: Secondary | ICD-10-CM | POA: Diagnosis not present

## 2019-01-22 DIAGNOSIS — M6281 Muscle weakness (generalized): Secondary | ICD-10-CM | POA: Diagnosis not present

## 2019-01-22 DIAGNOSIS — R2681 Unsteadiness on feet: Secondary | ICD-10-CM | POA: Diagnosis not present

## 2019-01-22 DIAGNOSIS — R488 Other symbolic dysfunctions: Secondary | ICD-10-CM | POA: Diagnosis not present

## 2019-01-22 DIAGNOSIS — R2689 Other abnormalities of gait and mobility: Secondary | ICD-10-CM | POA: Diagnosis not present

## 2019-01-23 DIAGNOSIS — R488 Other symbolic dysfunctions: Secondary | ICD-10-CM | POA: Diagnosis not present

## 2019-01-23 DIAGNOSIS — R2681 Unsteadiness on feet: Secondary | ICD-10-CM | POA: Diagnosis not present

## 2019-01-23 DIAGNOSIS — M6281 Muscle weakness (generalized): Secondary | ICD-10-CM | POA: Diagnosis not present

## 2019-01-23 DIAGNOSIS — R2689 Other abnormalities of gait and mobility: Secondary | ICD-10-CM | POA: Diagnosis not present

## 2019-01-24 DIAGNOSIS — R2689 Other abnormalities of gait and mobility: Secondary | ICD-10-CM | POA: Diagnosis not present

## 2019-01-24 DIAGNOSIS — R2681 Unsteadiness on feet: Secondary | ICD-10-CM | POA: Diagnosis not present

## 2019-01-24 DIAGNOSIS — R488 Other symbolic dysfunctions: Secondary | ICD-10-CM | POA: Diagnosis not present

## 2019-01-24 DIAGNOSIS — M6281 Muscle weakness (generalized): Secondary | ICD-10-CM | POA: Diagnosis not present

## 2019-01-25 DIAGNOSIS — R488 Other symbolic dysfunctions: Secondary | ICD-10-CM | POA: Diagnosis not present

## 2019-01-25 DIAGNOSIS — M6281 Muscle weakness (generalized): Secondary | ICD-10-CM | POA: Diagnosis not present

## 2019-01-25 DIAGNOSIS — R2681 Unsteadiness on feet: Secondary | ICD-10-CM | POA: Diagnosis not present

## 2019-01-25 DIAGNOSIS — R2689 Other abnormalities of gait and mobility: Secondary | ICD-10-CM | POA: Diagnosis not present

## 2019-01-26 DIAGNOSIS — R2689 Other abnormalities of gait and mobility: Secondary | ICD-10-CM | POA: Diagnosis not present

## 2019-01-26 DIAGNOSIS — R2681 Unsteadiness on feet: Secondary | ICD-10-CM | POA: Diagnosis not present

## 2019-01-26 DIAGNOSIS — M6281 Muscle weakness (generalized): Secondary | ICD-10-CM | POA: Diagnosis not present

## 2019-01-26 DIAGNOSIS — R488 Other symbolic dysfunctions: Secondary | ICD-10-CM | POA: Diagnosis not present

## 2019-01-27 DIAGNOSIS — R488 Other symbolic dysfunctions: Secondary | ICD-10-CM | POA: Diagnosis not present

## 2019-01-27 DIAGNOSIS — M6281 Muscle weakness (generalized): Secondary | ICD-10-CM | POA: Diagnosis not present

## 2019-01-27 DIAGNOSIS — R2689 Other abnormalities of gait and mobility: Secondary | ICD-10-CM | POA: Diagnosis not present

## 2019-01-27 DIAGNOSIS — R2681 Unsteadiness on feet: Secondary | ICD-10-CM | POA: Diagnosis not present

## 2019-01-29 DIAGNOSIS — R2689 Other abnormalities of gait and mobility: Secondary | ICD-10-CM | POA: Diagnosis not present

## 2019-01-29 DIAGNOSIS — R2681 Unsteadiness on feet: Secondary | ICD-10-CM | POA: Diagnosis not present

## 2019-01-29 DIAGNOSIS — M6281 Muscle weakness (generalized): Secondary | ICD-10-CM | POA: Diagnosis not present

## 2019-01-29 DIAGNOSIS — R488 Other symbolic dysfunctions: Secondary | ICD-10-CM | POA: Diagnosis not present

## 2019-01-31 DIAGNOSIS — R2689 Other abnormalities of gait and mobility: Secondary | ICD-10-CM | POA: Diagnosis not present

## 2019-01-31 DIAGNOSIS — R488 Other symbolic dysfunctions: Secondary | ICD-10-CM | POA: Diagnosis not present

## 2019-01-31 DIAGNOSIS — R2681 Unsteadiness on feet: Secondary | ICD-10-CM | POA: Diagnosis not present

## 2019-01-31 DIAGNOSIS — M6281 Muscle weakness (generalized): Secondary | ICD-10-CM | POA: Diagnosis not present

## 2019-02-01 DIAGNOSIS — R2681 Unsteadiness on feet: Secondary | ICD-10-CM | POA: Diagnosis not present

## 2019-02-01 DIAGNOSIS — R488 Other symbolic dysfunctions: Secondary | ICD-10-CM | POA: Diagnosis not present

## 2019-02-01 DIAGNOSIS — M6281 Muscle weakness (generalized): Secondary | ICD-10-CM | POA: Diagnosis not present

## 2019-02-01 DIAGNOSIS — R2689 Other abnormalities of gait and mobility: Secondary | ICD-10-CM | POA: Diagnosis not present

## 2019-02-02 DIAGNOSIS — M6281 Muscle weakness (generalized): Secondary | ICD-10-CM | POA: Diagnosis not present

## 2019-02-02 DIAGNOSIS — R2681 Unsteadiness on feet: Secondary | ICD-10-CM | POA: Diagnosis not present

## 2019-02-02 DIAGNOSIS — R2689 Other abnormalities of gait and mobility: Secondary | ICD-10-CM | POA: Diagnosis not present

## 2019-02-02 DIAGNOSIS — R488 Other symbolic dysfunctions: Secondary | ICD-10-CM | POA: Diagnosis not present

## 2019-02-05 DIAGNOSIS — R2689 Other abnormalities of gait and mobility: Secondary | ICD-10-CM | POA: Diagnosis not present

## 2019-02-05 DIAGNOSIS — M6281 Muscle weakness (generalized): Secondary | ICD-10-CM | POA: Diagnosis not present

## 2019-02-05 DIAGNOSIS — R2681 Unsteadiness on feet: Secondary | ICD-10-CM | POA: Diagnosis not present

## 2019-02-05 DIAGNOSIS — R488 Other symbolic dysfunctions: Secondary | ICD-10-CM | POA: Diagnosis not present

## 2019-02-06 DIAGNOSIS — R488 Other symbolic dysfunctions: Secondary | ICD-10-CM | POA: Diagnosis not present

## 2019-02-06 DIAGNOSIS — M6281 Muscle weakness (generalized): Secondary | ICD-10-CM | POA: Diagnosis not present

## 2019-02-06 DIAGNOSIS — R2689 Other abnormalities of gait and mobility: Secondary | ICD-10-CM | POA: Diagnosis not present

## 2019-02-06 DIAGNOSIS — R2681 Unsteadiness on feet: Secondary | ICD-10-CM | POA: Diagnosis not present

## 2019-02-08 DIAGNOSIS — R2689 Other abnormalities of gait and mobility: Secondary | ICD-10-CM | POA: Diagnosis not present

## 2019-02-08 DIAGNOSIS — M6281 Muscle weakness (generalized): Secondary | ICD-10-CM | POA: Diagnosis not present

## 2019-02-08 DIAGNOSIS — R2681 Unsteadiness on feet: Secondary | ICD-10-CM | POA: Diagnosis not present

## 2019-02-08 DIAGNOSIS — R488 Other symbolic dysfunctions: Secondary | ICD-10-CM | POA: Diagnosis not present

## 2019-02-09 DIAGNOSIS — R2689 Other abnormalities of gait and mobility: Secondary | ICD-10-CM | POA: Diagnosis not present

## 2019-02-09 DIAGNOSIS — R488 Other symbolic dysfunctions: Secondary | ICD-10-CM | POA: Diagnosis not present

## 2019-02-09 DIAGNOSIS — M6281 Muscle weakness (generalized): Secondary | ICD-10-CM | POA: Diagnosis not present

## 2019-02-09 DIAGNOSIS — R2681 Unsteadiness on feet: Secondary | ICD-10-CM | POA: Diagnosis not present

## 2019-02-10 DIAGNOSIS — R2681 Unsteadiness on feet: Secondary | ICD-10-CM | POA: Diagnosis not present

## 2019-02-10 DIAGNOSIS — R2689 Other abnormalities of gait and mobility: Secondary | ICD-10-CM | POA: Diagnosis not present

## 2019-02-10 DIAGNOSIS — M6281 Muscle weakness (generalized): Secondary | ICD-10-CM | POA: Diagnosis not present

## 2019-02-10 DIAGNOSIS — R488 Other symbolic dysfunctions: Secondary | ICD-10-CM | POA: Diagnosis not present

## 2019-02-12 DIAGNOSIS — R2689 Other abnormalities of gait and mobility: Secondary | ICD-10-CM | POA: Diagnosis not present

## 2019-02-12 DIAGNOSIS — M6281 Muscle weakness (generalized): Secondary | ICD-10-CM | POA: Diagnosis not present

## 2019-02-12 DIAGNOSIS — R488 Other symbolic dysfunctions: Secondary | ICD-10-CM | POA: Diagnosis not present

## 2019-02-12 DIAGNOSIS — R2681 Unsteadiness on feet: Secondary | ICD-10-CM | POA: Diagnosis not present

## 2019-02-13 DIAGNOSIS — R2681 Unsteadiness on feet: Secondary | ICD-10-CM | POA: Diagnosis not present

## 2019-02-13 DIAGNOSIS — R488 Other symbolic dysfunctions: Secondary | ICD-10-CM | POA: Diagnosis not present

## 2019-02-13 DIAGNOSIS — M6281 Muscle weakness (generalized): Secondary | ICD-10-CM | POA: Diagnosis not present

## 2019-02-13 DIAGNOSIS — R2689 Other abnormalities of gait and mobility: Secondary | ICD-10-CM | POA: Diagnosis not present

## 2019-02-14 DIAGNOSIS — M6281 Muscle weakness (generalized): Secondary | ICD-10-CM | POA: Diagnosis not present

## 2019-02-14 DIAGNOSIS — R2689 Other abnormalities of gait and mobility: Secondary | ICD-10-CM | POA: Diagnosis not present

## 2019-02-14 DIAGNOSIS — R2681 Unsteadiness on feet: Secondary | ICD-10-CM | POA: Diagnosis not present

## 2019-02-14 DIAGNOSIS — R488 Other symbolic dysfunctions: Secondary | ICD-10-CM | POA: Diagnosis not present

## 2019-02-15 DIAGNOSIS — R2681 Unsteadiness on feet: Secondary | ICD-10-CM | POA: Diagnosis not present

## 2019-02-15 DIAGNOSIS — R296 Repeated falls: Secondary | ICD-10-CM | POA: Diagnosis not present

## 2019-02-15 DIAGNOSIS — R488 Other symbolic dysfunctions: Secondary | ICD-10-CM | POA: Diagnosis not present

## 2019-02-15 DIAGNOSIS — R262 Difficulty in walking, not elsewhere classified: Secondary | ICD-10-CM | POA: Diagnosis not present

## 2019-02-15 DIAGNOSIS — M6281 Muscle weakness (generalized): Secondary | ICD-10-CM | POA: Diagnosis not present

## 2019-02-15 DIAGNOSIS — R2689 Other abnormalities of gait and mobility: Secondary | ICD-10-CM | POA: Diagnosis not present

## 2019-02-16 DIAGNOSIS — R262 Difficulty in walking, not elsewhere classified: Secondary | ICD-10-CM | POA: Diagnosis not present

## 2019-02-16 DIAGNOSIS — M6281 Muscle weakness (generalized): Secondary | ICD-10-CM | POA: Diagnosis not present

## 2019-02-16 DIAGNOSIS — R296 Repeated falls: Secondary | ICD-10-CM | POA: Diagnosis not present

## 2019-02-16 DIAGNOSIS — R488 Other symbolic dysfunctions: Secondary | ICD-10-CM | POA: Diagnosis not present

## 2019-02-16 DIAGNOSIS — R2689 Other abnormalities of gait and mobility: Secondary | ICD-10-CM | POA: Diagnosis not present

## 2019-02-16 DIAGNOSIS — R2681 Unsteadiness on feet: Secondary | ICD-10-CM | POA: Diagnosis not present

## 2019-02-19 DIAGNOSIS — M6281 Muscle weakness (generalized): Secondary | ICD-10-CM | POA: Diagnosis not present

## 2019-02-19 DIAGNOSIS — R2681 Unsteadiness on feet: Secondary | ICD-10-CM | POA: Diagnosis not present

## 2019-02-19 DIAGNOSIS — R296 Repeated falls: Secondary | ICD-10-CM | POA: Diagnosis not present

## 2019-02-19 DIAGNOSIS — R262 Difficulty in walking, not elsewhere classified: Secondary | ICD-10-CM | POA: Diagnosis not present

## 2019-02-20 DIAGNOSIS — R2681 Unsteadiness on feet: Secondary | ICD-10-CM | POA: Diagnosis not present

## 2019-02-20 DIAGNOSIS — R296 Repeated falls: Secondary | ICD-10-CM | POA: Diagnosis not present

## 2019-02-20 DIAGNOSIS — M6281 Muscle weakness (generalized): Secondary | ICD-10-CM | POA: Diagnosis not present

## 2019-02-20 DIAGNOSIS — R488 Other symbolic dysfunctions: Secondary | ICD-10-CM | POA: Diagnosis not present

## 2019-02-20 DIAGNOSIS — R262 Difficulty in walking, not elsewhere classified: Secondary | ICD-10-CM | POA: Diagnosis not present

## 2019-02-20 DIAGNOSIS — R2689 Other abnormalities of gait and mobility: Secondary | ICD-10-CM | POA: Diagnosis not present

## 2019-02-21 DIAGNOSIS — R488 Other symbolic dysfunctions: Secondary | ICD-10-CM | POA: Diagnosis not present

## 2019-02-21 DIAGNOSIS — R296 Repeated falls: Secondary | ICD-10-CM | POA: Diagnosis not present

## 2019-02-21 DIAGNOSIS — R262 Difficulty in walking, not elsewhere classified: Secondary | ICD-10-CM | POA: Diagnosis not present

## 2019-02-21 DIAGNOSIS — R2689 Other abnormalities of gait and mobility: Secondary | ICD-10-CM | POA: Diagnosis not present

## 2019-02-21 DIAGNOSIS — R2681 Unsteadiness on feet: Secondary | ICD-10-CM | POA: Diagnosis not present

## 2019-02-21 DIAGNOSIS — M6281 Muscle weakness (generalized): Secondary | ICD-10-CM | POA: Diagnosis not present

## 2019-02-26 DIAGNOSIS — R41841 Cognitive communication deficit: Secondary | ICD-10-CM | POA: Diagnosis not present

## 2019-02-26 DIAGNOSIS — R488 Other symbolic dysfunctions: Secondary | ICD-10-CM | POA: Diagnosis not present

## 2019-02-27 DIAGNOSIS — R41841 Cognitive communication deficit: Secondary | ICD-10-CM | POA: Diagnosis not present

## 2019-02-27 DIAGNOSIS — R488 Other symbolic dysfunctions: Secondary | ICD-10-CM | POA: Diagnosis not present

## 2019-03-01 DIAGNOSIS — R296 Repeated falls: Secondary | ICD-10-CM | POA: Diagnosis not present

## 2019-03-01 DIAGNOSIS — R69 Illness, unspecified: Secondary | ICD-10-CM | POA: Diagnosis not present

## 2019-03-01 DIAGNOSIS — R41841 Cognitive communication deficit: Secondary | ICD-10-CM | POA: Diagnosis not present

## 2019-03-01 DIAGNOSIS — R488 Other symbolic dysfunctions: Secondary | ICD-10-CM | POA: Diagnosis not present

## 2019-03-01 DIAGNOSIS — R2681 Unsteadiness on feet: Secondary | ICD-10-CM | POA: Diagnosis not present

## 2019-03-01 DIAGNOSIS — M6281 Muscle weakness (generalized): Secondary | ICD-10-CM | POA: Diagnosis not present

## 2019-03-01 DIAGNOSIS — R262 Difficulty in walking, not elsewhere classified: Secondary | ICD-10-CM | POA: Diagnosis not present

## 2019-03-05 DIAGNOSIS — R41841 Cognitive communication deficit: Secondary | ICD-10-CM | POA: Diagnosis not present

## 2019-03-05 DIAGNOSIS — R488 Other symbolic dysfunctions: Secondary | ICD-10-CM | POA: Diagnosis not present

## 2019-03-06 DIAGNOSIS — R41841 Cognitive communication deficit: Secondary | ICD-10-CM | POA: Diagnosis not present

## 2019-03-06 DIAGNOSIS — R69 Illness, unspecified: Secondary | ICD-10-CM | POA: Diagnosis not present

## 2019-03-06 DIAGNOSIS — R488 Other symbolic dysfunctions: Secondary | ICD-10-CM | POA: Diagnosis not present

## 2019-03-06 DIAGNOSIS — R2681 Unsteadiness on feet: Secondary | ICD-10-CM | POA: Diagnosis not present

## 2019-03-06 DIAGNOSIS — R296 Repeated falls: Secondary | ICD-10-CM | POA: Diagnosis not present

## 2019-03-06 DIAGNOSIS — R262 Difficulty in walking, not elsewhere classified: Secondary | ICD-10-CM | POA: Diagnosis not present

## 2019-03-06 DIAGNOSIS — M6281 Muscle weakness (generalized): Secondary | ICD-10-CM | POA: Diagnosis not present

## 2019-03-08 DIAGNOSIS — Z79899 Other long term (current) drug therapy: Secondary | ICD-10-CM | POA: Diagnosis not present

## 2019-03-08 DIAGNOSIS — R2681 Unsteadiness on feet: Secondary | ICD-10-CM | POA: Diagnosis not present

## 2019-03-08 DIAGNOSIS — M6281 Muscle weakness (generalized): Secondary | ICD-10-CM | POA: Diagnosis not present

## 2019-03-08 DIAGNOSIS — R488 Other symbolic dysfunctions: Secondary | ICD-10-CM | POA: Diagnosis not present

## 2019-03-08 DIAGNOSIS — R41841 Cognitive communication deficit: Secondary | ICD-10-CM | POA: Diagnosis not present

## 2019-03-08 DIAGNOSIS — R262 Difficulty in walking, not elsewhere classified: Secondary | ICD-10-CM | POA: Diagnosis not present

## 2019-03-08 DIAGNOSIS — I5030 Unspecified diastolic (congestive) heart failure: Secondary | ICD-10-CM | POA: Diagnosis not present

## 2019-03-08 DIAGNOSIS — R296 Repeated falls: Secondary | ICD-10-CM | POA: Diagnosis not present

## 2019-03-12 DIAGNOSIS — R488 Other symbolic dysfunctions: Secondary | ICD-10-CM | POA: Diagnosis not present

## 2019-03-12 DIAGNOSIS — R41841 Cognitive communication deficit: Secondary | ICD-10-CM | POA: Diagnosis not present

## 2019-03-13 DIAGNOSIS — R41841 Cognitive communication deficit: Secondary | ICD-10-CM | POA: Diagnosis not present

## 2019-03-13 DIAGNOSIS — R488 Other symbolic dysfunctions: Secondary | ICD-10-CM | POA: Diagnosis not present

## 2019-03-15 DIAGNOSIS — R41841 Cognitive communication deficit: Secondary | ICD-10-CM | POA: Diagnosis not present

## 2019-03-15 DIAGNOSIS — R488 Other symbolic dysfunctions: Secondary | ICD-10-CM | POA: Diagnosis not present

## 2019-03-19 DIAGNOSIS — R41841 Cognitive communication deficit: Secondary | ICD-10-CM | POA: Diagnosis not present

## 2019-03-19 DIAGNOSIS — R488 Other symbolic dysfunctions: Secondary | ICD-10-CM | POA: Diagnosis not present

## 2019-03-20 DIAGNOSIS — R41841 Cognitive communication deficit: Secondary | ICD-10-CM | POA: Diagnosis not present

## 2019-03-20 DIAGNOSIS — R488 Other symbolic dysfunctions: Secondary | ICD-10-CM | POA: Diagnosis not present

## 2019-03-22 DIAGNOSIS — R488 Other symbolic dysfunctions: Secondary | ICD-10-CM | POA: Diagnosis not present

## 2019-03-22 DIAGNOSIS — R41841 Cognitive communication deficit: Secondary | ICD-10-CM | POA: Diagnosis not present

## 2019-03-27 DIAGNOSIS — R41841 Cognitive communication deficit: Secondary | ICD-10-CM | POA: Diagnosis not present

## 2019-03-27 DIAGNOSIS — R488 Other symbolic dysfunctions: Secondary | ICD-10-CM | POA: Diagnosis not present

## 2019-03-28 DIAGNOSIS — R488 Other symbolic dysfunctions: Secondary | ICD-10-CM | POA: Diagnosis not present

## 2019-03-28 DIAGNOSIS — R41841 Cognitive communication deficit: Secondary | ICD-10-CM | POA: Diagnosis not present

## 2019-03-29 DIAGNOSIS — R488 Other symbolic dysfunctions: Secondary | ICD-10-CM | POA: Diagnosis not present

## 2019-03-29 DIAGNOSIS — R41841 Cognitive communication deficit: Secondary | ICD-10-CM | POA: Diagnosis not present

## 2019-04-02 DIAGNOSIS — R41841 Cognitive communication deficit: Secondary | ICD-10-CM | POA: Diagnosis not present

## 2019-04-02 DIAGNOSIS — R488 Other symbolic dysfunctions: Secondary | ICD-10-CM | POA: Diagnosis not present

## 2019-04-04 DIAGNOSIS — R488 Other symbolic dysfunctions: Secondary | ICD-10-CM | POA: Diagnosis not present

## 2019-04-04 DIAGNOSIS — R41841 Cognitive communication deficit: Secondary | ICD-10-CM | POA: Diagnosis not present

## 2019-04-05 DIAGNOSIS — R488 Other symbolic dysfunctions: Secondary | ICD-10-CM | POA: Diagnosis not present

## 2019-04-05 DIAGNOSIS — R41841 Cognitive communication deficit: Secondary | ICD-10-CM | POA: Diagnosis not present

## 2019-04-09 DIAGNOSIS — R41841 Cognitive communication deficit: Secondary | ICD-10-CM | POA: Diagnosis not present

## 2019-04-09 DIAGNOSIS — R488 Other symbolic dysfunctions: Secondary | ICD-10-CM | POA: Diagnosis not present

## 2019-04-10 DIAGNOSIS — N179 Acute kidney failure, unspecified: Secondary | ICD-10-CM | POA: Diagnosis not present

## 2019-04-10 DIAGNOSIS — D7589 Other specified diseases of blood and blood-forming organs: Secondary | ICD-10-CM | POA: Diagnosis not present

## 2019-04-10 DIAGNOSIS — R41841 Cognitive communication deficit: Secondary | ICD-10-CM | POA: Diagnosis not present

## 2019-04-10 DIAGNOSIS — D473 Essential (hemorrhagic) thrombocythemia: Secondary | ICD-10-CM | POA: Diagnosis not present

## 2019-04-10 DIAGNOSIS — R488 Other symbolic dysfunctions: Secondary | ICD-10-CM | POA: Diagnosis not present

## 2019-04-10 DIAGNOSIS — R030 Elevated blood-pressure reading, without diagnosis of hypertension: Secondary | ICD-10-CM | POA: Diagnosis not present

## 2019-04-11 DIAGNOSIS — R41841 Cognitive communication deficit: Secondary | ICD-10-CM | POA: Diagnosis not present

## 2019-04-11 DIAGNOSIS — R488 Other symbolic dysfunctions: Secondary | ICD-10-CM | POA: Diagnosis not present

## 2019-04-13 DIAGNOSIS — R488 Other symbolic dysfunctions: Secondary | ICD-10-CM | POA: Diagnosis not present

## 2019-04-13 DIAGNOSIS — R41841 Cognitive communication deficit: Secondary | ICD-10-CM | POA: Diagnosis not present

## 2019-04-14 DIAGNOSIS — I739 Peripheral vascular disease, unspecified: Secondary | ICD-10-CM | POA: Diagnosis not present

## 2019-04-14 DIAGNOSIS — B351 Tinea unguium: Secondary | ICD-10-CM | POA: Diagnosis not present

## 2019-04-14 DIAGNOSIS — L84 Corns and callosities: Secondary | ICD-10-CM | POA: Diagnosis not present

## 2019-04-14 DIAGNOSIS — I872 Venous insufficiency (chronic) (peripheral): Secondary | ICD-10-CM | POA: Diagnosis not present

## 2019-04-16 DIAGNOSIS — R41841 Cognitive communication deficit: Secondary | ICD-10-CM | POA: Diagnosis not present

## 2019-04-16 DIAGNOSIS — R488 Other symbolic dysfunctions: Secondary | ICD-10-CM | POA: Diagnosis not present

## 2019-04-17 DIAGNOSIS — R41841 Cognitive communication deficit: Secondary | ICD-10-CM | POA: Diagnosis not present

## 2019-04-17 DIAGNOSIS — R488 Other symbolic dysfunctions: Secondary | ICD-10-CM | POA: Diagnosis not present

## 2019-04-19 DIAGNOSIS — R41841 Cognitive communication deficit: Secondary | ICD-10-CM | POA: Diagnosis not present

## 2019-04-19 DIAGNOSIS — R488 Other symbolic dysfunctions: Secondary | ICD-10-CM | POA: Diagnosis not present

## 2019-04-24 DIAGNOSIS — H353211 Exudative age-related macular degeneration, right eye, with active choroidal neovascularization: Secondary | ICD-10-CM | POA: Diagnosis not present

## 2019-04-24 DIAGNOSIS — H353221 Exudative age-related macular degeneration, left eye, with active choroidal neovascularization: Secondary | ICD-10-CM | POA: Diagnosis not present

## 2019-04-25 DIAGNOSIS — R488 Other symbolic dysfunctions: Secondary | ICD-10-CM | POA: Diagnosis not present

## 2019-04-25 DIAGNOSIS — R41841 Cognitive communication deficit: Secondary | ICD-10-CM | POA: Diagnosis not present

## 2019-04-27 DIAGNOSIS — R488 Other symbolic dysfunctions: Secondary | ICD-10-CM | POA: Diagnosis not present

## 2019-04-27 DIAGNOSIS — R41841 Cognitive communication deficit: Secondary | ICD-10-CM | POA: Diagnosis not present

## 2019-04-30 DIAGNOSIS — R41841 Cognitive communication deficit: Secondary | ICD-10-CM | POA: Diagnosis not present

## 2019-04-30 DIAGNOSIS — R488 Other symbolic dysfunctions: Secondary | ICD-10-CM | POA: Diagnosis not present

## 2019-05-02 DIAGNOSIS — R488 Other symbolic dysfunctions: Secondary | ICD-10-CM | POA: Diagnosis not present

## 2019-05-02 DIAGNOSIS — R41841 Cognitive communication deficit: Secondary | ICD-10-CM | POA: Diagnosis not present

## 2019-06-06 ENCOUNTER — Telehealth: Payer: Self-pay

## 2019-06-06 NOTE — Telephone Encounter (Signed)
LMOM FOR PATIENT TO SCHEDULE AWV.

## 2019-06-14 DIAGNOSIS — E039 Hypothyroidism, unspecified: Secondary | ICD-10-CM | POA: Diagnosis not present

## 2019-06-14 DIAGNOSIS — L84 Corns and callosities: Secondary | ICD-10-CM | POA: Diagnosis not present

## 2019-06-14 DIAGNOSIS — I739 Peripheral vascular disease, unspecified: Secondary | ICD-10-CM | POA: Diagnosis not present

## 2019-06-14 DIAGNOSIS — I5032 Chronic diastolic (congestive) heart failure: Secondary | ICD-10-CM | POA: Diagnosis not present

## 2019-06-14 DIAGNOSIS — R609 Edema, unspecified: Secondary | ICD-10-CM | POA: Diagnosis not present

## 2019-06-14 DIAGNOSIS — Z79899 Other long term (current) drug therapy: Secondary | ICD-10-CM | POA: Diagnosis not present

## 2019-06-14 DIAGNOSIS — B351 Tinea unguium: Secondary | ICD-10-CM | POA: Diagnosis not present

## 2019-06-14 DIAGNOSIS — R269 Unspecified abnormalities of gait and mobility: Secondary | ICD-10-CM | POA: Diagnosis not present

## 2019-06-15 DIAGNOSIS — R488 Other symbolic dysfunctions: Secondary | ICD-10-CM | POA: Diagnosis not present

## 2019-06-15 DIAGNOSIS — M6281 Muscle weakness (generalized): Secondary | ICD-10-CM | POA: Diagnosis not present

## 2019-06-15 DIAGNOSIS — R278 Other lack of coordination: Secondary | ICD-10-CM | POA: Diagnosis not present

## 2019-06-18 DIAGNOSIS — M6281 Muscle weakness (generalized): Secondary | ICD-10-CM | POA: Diagnosis not present

## 2019-06-18 DIAGNOSIS — R488 Other symbolic dysfunctions: Secondary | ICD-10-CM | POA: Diagnosis not present

## 2019-06-18 DIAGNOSIS — R278 Other lack of coordination: Secondary | ICD-10-CM | POA: Diagnosis not present

## 2019-06-21 DIAGNOSIS — M6281 Muscle weakness (generalized): Secondary | ICD-10-CM | POA: Diagnosis not present

## 2019-06-21 DIAGNOSIS — R488 Other symbolic dysfunctions: Secondary | ICD-10-CM | POA: Diagnosis not present

## 2019-06-21 DIAGNOSIS — R278 Other lack of coordination: Secondary | ICD-10-CM | POA: Diagnosis not present

## 2019-06-22 DIAGNOSIS — R488 Other symbolic dysfunctions: Secondary | ICD-10-CM | POA: Diagnosis not present

## 2019-06-22 DIAGNOSIS — R278 Other lack of coordination: Secondary | ICD-10-CM | POA: Diagnosis not present

## 2019-06-22 DIAGNOSIS — M6281 Muscle weakness (generalized): Secondary | ICD-10-CM | POA: Diagnosis not present

## 2019-06-25 DIAGNOSIS — R278 Other lack of coordination: Secondary | ICD-10-CM | POA: Diagnosis not present

## 2019-06-25 DIAGNOSIS — M6281 Muscle weakness (generalized): Secondary | ICD-10-CM | POA: Diagnosis not present

## 2019-06-25 DIAGNOSIS — R488 Other symbolic dysfunctions: Secondary | ICD-10-CM | POA: Diagnosis not present

## 2019-06-26 DIAGNOSIS — R278 Other lack of coordination: Secondary | ICD-10-CM | POA: Diagnosis not present

## 2019-06-26 DIAGNOSIS — M6281 Muscle weakness (generalized): Secondary | ICD-10-CM | POA: Diagnosis not present

## 2019-06-26 DIAGNOSIS — R488 Other symbolic dysfunctions: Secondary | ICD-10-CM | POA: Diagnosis not present

## 2019-06-28 DIAGNOSIS — R278 Other lack of coordination: Secondary | ICD-10-CM | POA: Diagnosis not present

## 2019-06-28 DIAGNOSIS — M6281 Muscle weakness (generalized): Secondary | ICD-10-CM | POA: Diagnosis not present

## 2019-06-28 DIAGNOSIS — R488 Other symbolic dysfunctions: Secondary | ICD-10-CM | POA: Diagnosis not present

## 2019-07-02 DIAGNOSIS — H3530 Unspecified macular degeneration: Secondary | ICD-10-CM | POA: Diagnosis not present

## 2019-07-02 DIAGNOSIS — S0101XA Laceration without foreign body of scalp, initial encounter: Secondary | ICD-10-CM | POA: Diagnosis not present

## 2019-07-02 DIAGNOSIS — I5032 Chronic diastolic (congestive) heart failure: Secondary | ICD-10-CM | POA: Diagnosis not present

## 2019-07-02 DIAGNOSIS — H6121 Impacted cerumen, right ear: Secondary | ICD-10-CM | POA: Diagnosis not present

## 2019-07-02 DIAGNOSIS — R269 Unspecified abnormalities of gait and mobility: Secondary | ICD-10-CM | POA: Diagnosis not present

## 2019-07-02 DIAGNOSIS — R7989 Other specified abnormal findings of blood chemistry: Secondary | ICD-10-CM | POA: Diagnosis not present

## 2019-07-02 DIAGNOSIS — R69 Illness, unspecified: Secondary | ICD-10-CM | POA: Diagnosis not present

## 2019-07-02 DIAGNOSIS — L219 Seborrheic dermatitis, unspecified: Secondary | ICD-10-CM | POA: Diagnosis not present

## 2019-07-02 DIAGNOSIS — W19XXXA Unspecified fall, initial encounter: Secondary | ICD-10-CM | POA: Diagnosis not present

## 2019-07-03 DIAGNOSIS — M6281 Muscle weakness (generalized): Secondary | ICD-10-CM | POA: Diagnosis not present

## 2019-07-03 DIAGNOSIS — R278 Other lack of coordination: Secondary | ICD-10-CM | POA: Diagnosis not present

## 2019-07-03 DIAGNOSIS — R488 Other symbolic dysfunctions: Secondary | ICD-10-CM | POA: Diagnosis not present

## 2019-07-05 DIAGNOSIS — M6281 Muscle weakness (generalized): Secondary | ICD-10-CM | POA: Diagnosis not present

## 2019-07-05 DIAGNOSIS — R488 Other symbolic dysfunctions: Secondary | ICD-10-CM | POA: Diagnosis not present

## 2019-07-05 DIAGNOSIS — R278 Other lack of coordination: Secondary | ICD-10-CM | POA: Diagnosis not present

## 2019-07-06 DIAGNOSIS — R488 Other symbolic dysfunctions: Secondary | ICD-10-CM | POA: Diagnosis not present

## 2019-07-06 DIAGNOSIS — M6281 Muscle weakness (generalized): Secondary | ICD-10-CM | POA: Diagnosis not present

## 2019-07-06 DIAGNOSIS — R278 Other lack of coordination: Secondary | ICD-10-CM | POA: Diagnosis not present

## 2019-07-09 DIAGNOSIS — M6281 Muscle weakness (generalized): Secondary | ICD-10-CM | POA: Diagnosis not present

## 2019-07-09 DIAGNOSIS — R69 Illness, unspecified: Secondary | ICD-10-CM | POA: Diagnosis not present

## 2019-07-09 DIAGNOSIS — H6121 Impacted cerumen, right ear: Secondary | ICD-10-CM | POA: Diagnosis not present

## 2019-07-09 DIAGNOSIS — S0101XD Laceration without foreign body of scalp, subsequent encounter: Secondary | ICD-10-CM | POA: Diagnosis not present

## 2019-07-09 DIAGNOSIS — Z66 Do not resuscitate: Secondary | ICD-10-CM | POA: Diagnosis not present

## 2019-07-09 DIAGNOSIS — R488 Other symbolic dysfunctions: Secondary | ICD-10-CM | POA: Diagnosis not present

## 2019-07-09 DIAGNOSIS — R278 Other lack of coordination: Secondary | ICD-10-CM | POA: Diagnosis not present

## 2019-07-11 DIAGNOSIS — M6281 Muscle weakness (generalized): Secondary | ICD-10-CM | POA: Diagnosis not present

## 2019-07-11 DIAGNOSIS — R278 Other lack of coordination: Secondary | ICD-10-CM | POA: Diagnosis not present

## 2019-07-11 DIAGNOSIS — R488 Other symbolic dysfunctions: Secondary | ICD-10-CM | POA: Diagnosis not present

## 2019-07-13 DIAGNOSIS — R488 Other symbolic dysfunctions: Secondary | ICD-10-CM | POA: Diagnosis not present

## 2019-07-13 DIAGNOSIS — R278 Other lack of coordination: Secondary | ICD-10-CM | POA: Diagnosis not present

## 2019-07-13 DIAGNOSIS — M6281 Muscle weakness (generalized): Secondary | ICD-10-CM | POA: Diagnosis not present

## 2019-07-31 DIAGNOSIS — H353211 Exudative age-related macular degeneration, right eye, with active choroidal neovascularization: Secondary | ICD-10-CM | POA: Diagnosis not present

## 2019-07-31 DIAGNOSIS — H353221 Exudative age-related macular degeneration, left eye, with active choroidal neovascularization: Secondary | ICD-10-CM | POA: Diagnosis not present

## 2019-08-02 DIAGNOSIS — R488 Other symbolic dysfunctions: Secondary | ICD-10-CM | POA: Diagnosis not present

## 2019-08-02 DIAGNOSIS — M6281 Muscle weakness (generalized): Secondary | ICD-10-CM | POA: Diagnosis not present

## 2019-08-02 DIAGNOSIS — R278 Other lack of coordination: Secondary | ICD-10-CM | POA: Diagnosis not present

## 2019-08-06 DIAGNOSIS — R278 Other lack of coordination: Secondary | ICD-10-CM | POA: Diagnosis not present

## 2019-08-06 DIAGNOSIS — M6281 Muscle weakness (generalized): Secondary | ICD-10-CM | POA: Diagnosis not present

## 2019-08-06 DIAGNOSIS — R488 Other symbolic dysfunctions: Secondary | ICD-10-CM | POA: Diagnosis not present

## 2019-08-08 DIAGNOSIS — R488 Other symbolic dysfunctions: Secondary | ICD-10-CM | POA: Diagnosis not present

## 2019-08-08 DIAGNOSIS — M6281 Muscle weakness (generalized): Secondary | ICD-10-CM | POA: Diagnosis not present

## 2019-08-08 DIAGNOSIS — R278 Other lack of coordination: Secondary | ICD-10-CM | POA: Diagnosis not present

## 2019-08-13 DIAGNOSIS — M6281 Muscle weakness (generalized): Secondary | ICD-10-CM | POA: Diagnosis not present

## 2019-08-13 DIAGNOSIS — R488 Other symbolic dysfunctions: Secondary | ICD-10-CM | POA: Diagnosis not present

## 2019-08-13 DIAGNOSIS — R278 Other lack of coordination: Secondary | ICD-10-CM | POA: Diagnosis not present

## 2019-08-14 DIAGNOSIS — M6281 Muscle weakness (generalized): Secondary | ICD-10-CM | POA: Diagnosis not present

## 2019-08-14 DIAGNOSIS — R488 Other symbolic dysfunctions: Secondary | ICD-10-CM | POA: Diagnosis not present

## 2019-08-14 DIAGNOSIS — R278 Other lack of coordination: Secondary | ICD-10-CM | POA: Diagnosis not present

## 2019-08-16 DIAGNOSIS — M6281 Muscle weakness (generalized): Secondary | ICD-10-CM | POA: Diagnosis not present

## 2019-08-16 DIAGNOSIS — R278 Other lack of coordination: Secondary | ICD-10-CM | POA: Diagnosis not present

## 2019-08-16 DIAGNOSIS — R488 Other symbolic dysfunctions: Secondary | ICD-10-CM | POA: Diagnosis not present

## 2019-08-18 DIAGNOSIS — I739 Peripheral vascular disease, unspecified: Secondary | ICD-10-CM | POA: Diagnosis not present

## 2019-08-18 DIAGNOSIS — L602 Onychogryphosis: Secondary | ICD-10-CM | POA: Diagnosis not present

## 2019-08-20 DIAGNOSIS — R41841 Cognitive communication deficit: Secondary | ICD-10-CM | POA: Diagnosis not present

## 2019-08-22 DIAGNOSIS — R41841 Cognitive communication deficit: Secondary | ICD-10-CM | POA: Diagnosis not present

## 2019-08-24 DIAGNOSIS — R41841 Cognitive communication deficit: Secondary | ICD-10-CM | POA: Diagnosis not present

## 2019-08-27 DIAGNOSIS — R41841 Cognitive communication deficit: Secondary | ICD-10-CM | POA: Diagnosis not present

## 2019-08-30 DIAGNOSIS — R2681 Unsteadiness on feet: Secondary | ICD-10-CM | POA: Diagnosis not present

## 2019-08-30 DIAGNOSIS — M6281 Muscle weakness (generalized): Secondary | ICD-10-CM | POA: Diagnosis not present

## 2019-08-30 DIAGNOSIS — R2689 Other abnormalities of gait and mobility: Secondary | ICD-10-CM | POA: Diagnosis not present

## 2019-08-31 DIAGNOSIS — R41841 Cognitive communication deficit: Secondary | ICD-10-CM | POA: Diagnosis not present

## 2019-09-03 DIAGNOSIS — M6281 Muscle weakness (generalized): Secondary | ICD-10-CM | POA: Diagnosis not present

## 2019-09-03 DIAGNOSIS — R2681 Unsteadiness on feet: Secondary | ICD-10-CM | POA: Diagnosis not present

## 2019-09-03 DIAGNOSIS — R41841 Cognitive communication deficit: Secondary | ICD-10-CM | POA: Diagnosis not present

## 2019-09-03 DIAGNOSIS — R2689 Other abnormalities of gait and mobility: Secondary | ICD-10-CM | POA: Diagnosis not present

## 2019-09-05 DIAGNOSIS — R2689 Other abnormalities of gait and mobility: Secondary | ICD-10-CM | POA: Diagnosis not present

## 2019-09-05 DIAGNOSIS — R2681 Unsteadiness on feet: Secondary | ICD-10-CM | POA: Diagnosis not present

## 2019-09-05 DIAGNOSIS — M6281 Muscle weakness (generalized): Secondary | ICD-10-CM | POA: Diagnosis not present

## 2019-09-05 DIAGNOSIS — R41841 Cognitive communication deficit: Secondary | ICD-10-CM | POA: Diagnosis not present

## 2019-09-06 DIAGNOSIS — R2689 Other abnormalities of gait and mobility: Secondary | ICD-10-CM | POA: Diagnosis not present

## 2019-09-06 DIAGNOSIS — R2681 Unsteadiness on feet: Secondary | ICD-10-CM | POA: Diagnosis not present

## 2019-09-06 DIAGNOSIS — M6281 Muscle weakness (generalized): Secondary | ICD-10-CM | POA: Diagnosis not present

## 2019-09-07 DIAGNOSIS — R41841 Cognitive communication deficit: Secondary | ICD-10-CM | POA: Diagnosis not present

## 2019-09-10 DIAGNOSIS — R2689 Other abnormalities of gait and mobility: Secondary | ICD-10-CM | POA: Diagnosis not present

## 2019-09-10 DIAGNOSIS — R2681 Unsteadiness on feet: Secondary | ICD-10-CM | POA: Diagnosis not present

## 2019-09-10 DIAGNOSIS — R41841 Cognitive communication deficit: Secondary | ICD-10-CM | POA: Diagnosis not present

## 2019-09-10 DIAGNOSIS — M6281 Muscle weakness (generalized): Secondary | ICD-10-CM | POA: Diagnosis not present

## 2019-09-11 DIAGNOSIS — M6281 Muscle weakness (generalized): Secondary | ICD-10-CM | POA: Diagnosis not present

## 2019-09-11 DIAGNOSIS — R2689 Other abnormalities of gait and mobility: Secondary | ICD-10-CM | POA: Diagnosis not present

## 2019-09-11 DIAGNOSIS — R2681 Unsteadiness on feet: Secondary | ICD-10-CM | POA: Diagnosis not present

## 2019-09-12 DIAGNOSIS — R41841 Cognitive communication deficit: Secondary | ICD-10-CM | POA: Diagnosis not present

## 2019-09-13 DIAGNOSIS — R2681 Unsteadiness on feet: Secondary | ICD-10-CM | POA: Diagnosis not present

## 2019-09-13 DIAGNOSIS — R2689 Other abnormalities of gait and mobility: Secondary | ICD-10-CM | POA: Diagnosis not present

## 2019-09-13 DIAGNOSIS — M6281 Muscle weakness (generalized): Secondary | ICD-10-CM | POA: Diagnosis not present

## 2019-09-14 DIAGNOSIS — R41841 Cognitive communication deficit: Secondary | ICD-10-CM | POA: Diagnosis not present

## 2019-09-17 DIAGNOSIS — R41841 Cognitive communication deficit: Secondary | ICD-10-CM | POA: Diagnosis not present

## 2019-09-18 DIAGNOSIS — R2681 Unsteadiness on feet: Secondary | ICD-10-CM | POA: Diagnosis not present

## 2019-09-18 DIAGNOSIS — M6281 Muscle weakness (generalized): Secondary | ICD-10-CM | POA: Diagnosis not present

## 2019-09-18 DIAGNOSIS — R2689 Other abnormalities of gait and mobility: Secondary | ICD-10-CM | POA: Diagnosis not present

## 2019-09-19 DIAGNOSIS — R2681 Unsteadiness on feet: Secondary | ICD-10-CM | POA: Diagnosis not present

## 2019-09-19 DIAGNOSIS — R2689 Other abnormalities of gait and mobility: Secondary | ICD-10-CM | POA: Diagnosis not present

## 2019-09-19 DIAGNOSIS — R41841 Cognitive communication deficit: Secondary | ICD-10-CM | POA: Diagnosis not present

## 2019-09-19 DIAGNOSIS — M6281 Muscle weakness (generalized): Secondary | ICD-10-CM | POA: Diagnosis not present

## 2019-09-20 DIAGNOSIS — M6281 Muscle weakness (generalized): Secondary | ICD-10-CM | POA: Diagnosis not present

## 2019-09-20 DIAGNOSIS — R2689 Other abnormalities of gait and mobility: Secondary | ICD-10-CM | POA: Diagnosis not present

## 2019-09-20 DIAGNOSIS — R2681 Unsteadiness on feet: Secondary | ICD-10-CM | POA: Diagnosis not present

## 2019-09-21 DIAGNOSIS — R41841 Cognitive communication deficit: Secondary | ICD-10-CM | POA: Diagnosis not present

## 2019-09-24 DIAGNOSIS — R41841 Cognitive communication deficit: Secondary | ICD-10-CM | POA: Diagnosis not present

## 2019-09-25 DIAGNOSIS — R2689 Other abnormalities of gait and mobility: Secondary | ICD-10-CM | POA: Diagnosis not present

## 2019-09-25 DIAGNOSIS — R2681 Unsteadiness on feet: Secondary | ICD-10-CM | POA: Diagnosis not present

## 2019-09-25 DIAGNOSIS — M6281 Muscle weakness (generalized): Secondary | ICD-10-CM | POA: Diagnosis not present

## 2019-09-26 DIAGNOSIS — R2681 Unsteadiness on feet: Secondary | ICD-10-CM | POA: Diagnosis not present

## 2019-09-26 DIAGNOSIS — R2689 Other abnormalities of gait and mobility: Secondary | ICD-10-CM | POA: Diagnosis not present

## 2019-09-26 DIAGNOSIS — R41841 Cognitive communication deficit: Secondary | ICD-10-CM | POA: Diagnosis not present

## 2019-09-26 DIAGNOSIS — M6281 Muscle weakness (generalized): Secondary | ICD-10-CM | POA: Diagnosis not present

## 2019-09-27 DIAGNOSIS — R2689 Other abnormalities of gait and mobility: Secondary | ICD-10-CM | POA: Diagnosis not present

## 2019-09-27 DIAGNOSIS — R2681 Unsteadiness on feet: Secondary | ICD-10-CM | POA: Diagnosis not present

## 2019-09-27 DIAGNOSIS — M6281 Muscle weakness (generalized): Secondary | ICD-10-CM | POA: Diagnosis not present

## 2019-09-28 DIAGNOSIS — R41841 Cognitive communication deficit: Secondary | ICD-10-CM | POA: Diagnosis not present

## 2019-10-01 DIAGNOSIS — R630 Anorexia: Secondary | ICD-10-CM | POA: Diagnosis not present

## 2019-10-01 DIAGNOSIS — R269 Unspecified abnormalities of gait and mobility: Secondary | ICD-10-CM | POA: Diagnosis not present

## 2019-10-01 DIAGNOSIS — R69 Illness, unspecified: Secondary | ICD-10-CM | POA: Diagnosis not present

## 2019-10-01 DIAGNOSIS — R41841 Cognitive communication deficit: Secondary | ICD-10-CM | POA: Diagnosis not present

## 2019-10-02 DIAGNOSIS — R2681 Unsteadiness on feet: Secondary | ICD-10-CM | POA: Diagnosis not present

## 2019-10-02 DIAGNOSIS — R2689 Other abnormalities of gait and mobility: Secondary | ICD-10-CM | POA: Diagnosis not present

## 2019-10-02 DIAGNOSIS — M6281 Muscle weakness (generalized): Secondary | ICD-10-CM | POA: Diagnosis not present

## 2019-10-03 DIAGNOSIS — R41841 Cognitive communication deficit: Secondary | ICD-10-CM | POA: Diagnosis not present

## 2019-10-04 DIAGNOSIS — M6281 Muscle weakness (generalized): Secondary | ICD-10-CM | POA: Diagnosis not present

## 2019-10-04 DIAGNOSIS — R2689 Other abnormalities of gait and mobility: Secondary | ICD-10-CM | POA: Diagnosis not present

## 2019-10-04 DIAGNOSIS — R2681 Unsteadiness on feet: Secondary | ICD-10-CM | POA: Diagnosis not present

## 2019-10-05 DIAGNOSIS — R41841 Cognitive communication deficit: Secondary | ICD-10-CM | POA: Diagnosis not present

## 2019-10-08 DIAGNOSIS — R41841 Cognitive communication deficit: Secondary | ICD-10-CM | POA: Diagnosis not present

## 2019-10-10 DIAGNOSIS — R41841 Cognitive communication deficit: Secondary | ICD-10-CM | POA: Diagnosis not present

## 2019-10-11 DIAGNOSIS — M6281 Muscle weakness (generalized): Secondary | ICD-10-CM | POA: Diagnosis not present

## 2019-10-11 DIAGNOSIS — R2681 Unsteadiness on feet: Secondary | ICD-10-CM | POA: Diagnosis not present

## 2019-10-11 DIAGNOSIS — R2689 Other abnormalities of gait and mobility: Secondary | ICD-10-CM | POA: Diagnosis not present

## 2019-10-12 DIAGNOSIS — R41841 Cognitive communication deficit: Secondary | ICD-10-CM | POA: Diagnosis not present

## 2019-10-15 DIAGNOSIS — R41841 Cognitive communication deficit: Secondary | ICD-10-CM | POA: Diagnosis not present

## 2019-10-17 DIAGNOSIS — R41841 Cognitive communication deficit: Secondary | ICD-10-CM | POA: Diagnosis not present

## 2019-10-20 DIAGNOSIS — R41841 Cognitive communication deficit: Secondary | ICD-10-CM | POA: Diagnosis not present

## 2019-10-21 DIAGNOSIS — R41841 Cognitive communication deficit: Secondary | ICD-10-CM | POA: Diagnosis not present

## 2019-10-22 DIAGNOSIS — R41841 Cognitive communication deficit: Secondary | ICD-10-CM | POA: Diagnosis not present

## 2019-10-24 DIAGNOSIS — R41841 Cognitive communication deficit: Secondary | ICD-10-CM | POA: Diagnosis not present

## 2019-10-29 DIAGNOSIS — R630 Anorexia: Secondary | ICD-10-CM | POA: Diagnosis not present

## 2019-10-29 DIAGNOSIS — K5901 Slow transit constipation: Secondary | ICD-10-CM | POA: Diagnosis not present

## 2019-10-29 DIAGNOSIS — R269 Unspecified abnormalities of gait and mobility: Secondary | ICD-10-CM | POA: Diagnosis not present

## 2019-10-29 DIAGNOSIS — R69 Illness, unspecified: Secondary | ICD-10-CM | POA: Diagnosis not present

## 2019-10-29 DIAGNOSIS — I5032 Chronic diastolic (congestive) heart failure: Secondary | ICD-10-CM | POA: Diagnosis not present

## 2019-11-02 DIAGNOSIS — R41841 Cognitive communication deficit: Secondary | ICD-10-CM | POA: Diagnosis not present

## 2019-11-05 DIAGNOSIS — R41841 Cognitive communication deficit: Secondary | ICD-10-CM | POA: Diagnosis not present

## 2019-11-06 DIAGNOSIS — H353221 Exudative age-related macular degeneration, left eye, with active choroidal neovascularization: Secondary | ICD-10-CM | POA: Diagnosis not present

## 2019-11-06 DIAGNOSIS — H353211 Exudative age-related macular degeneration, right eye, with active choroidal neovascularization: Secondary | ICD-10-CM | POA: Diagnosis not present

## 2019-11-07 DIAGNOSIS — R41841 Cognitive communication deficit: Secondary | ICD-10-CM | POA: Diagnosis not present

## 2019-11-09 DIAGNOSIS — R41841 Cognitive communication deficit: Secondary | ICD-10-CM | POA: Diagnosis not present

## 2019-11-16 DIAGNOSIS — I5032 Chronic diastolic (congestive) heart failure: Secondary | ICD-10-CM | POA: Diagnosis not present

## 2019-11-16 DIAGNOSIS — E039 Hypothyroidism, unspecified: Secondary | ICD-10-CM | POA: Diagnosis not present

## 2019-11-16 DIAGNOSIS — R69 Illness, unspecified: Secondary | ICD-10-CM | POA: Diagnosis not present

## 2019-11-16 DIAGNOSIS — R630 Anorexia: Secondary | ICD-10-CM | POA: Diagnosis not present

## 2019-12-10 DIAGNOSIS — Z111 Encounter for screening for respiratory tuberculosis: Secondary | ICD-10-CM | POA: Diagnosis not present

## 2019-12-10 DIAGNOSIS — N3942 Incontinence without sensory awareness: Secondary | ICD-10-CM | POA: Diagnosis not present

## 2019-12-10 DIAGNOSIS — R159 Full incontinence of feces: Secondary | ICD-10-CM | POA: Diagnosis not present

## 2019-12-10 DIAGNOSIS — R69 Illness, unspecified: Secondary | ICD-10-CM | POA: Diagnosis not present

## 2019-12-11 DIAGNOSIS — Z111 Encounter for screening for respiratory tuberculosis: Secondary | ICD-10-CM | POA: Diagnosis not present

## 2020-01-17 DIAGNOSIS — Z20828 Contact with and (suspected) exposure to other viral communicable diseases: Secondary | ICD-10-CM | POA: Diagnosis not present

## 2020-01-24 DIAGNOSIS — Z20828 Contact with and (suspected) exposure to other viral communicable diseases: Secondary | ICD-10-CM | POA: Diagnosis not present

## 2020-02-12 DIAGNOSIS — H353211 Exudative age-related macular degeneration, right eye, with active choroidal neovascularization: Secondary | ICD-10-CM | POA: Diagnosis not present

## 2020-02-12 DIAGNOSIS — H353221 Exudative age-related macular degeneration, left eye, with active choroidal neovascularization: Secondary | ICD-10-CM | POA: Diagnosis not present

## 2020-02-18 DIAGNOSIS — R54 Age-related physical debility: Secondary | ICD-10-CM | POA: Diagnosis not present

## 2020-02-18 DIAGNOSIS — R627 Adult failure to thrive: Secondary | ICD-10-CM | POA: Diagnosis not present

## 2020-02-18 DIAGNOSIS — I5032 Chronic diastolic (congestive) heart failure: Secondary | ICD-10-CM | POA: Diagnosis not present

## 2020-02-18 DIAGNOSIS — E039 Hypothyroidism, unspecified: Secondary | ICD-10-CM | POA: Diagnosis not present

## 2020-02-18 DIAGNOSIS — R69 Illness, unspecified: Secondary | ICD-10-CM | POA: Diagnosis not present

## 2020-03-04 ENCOUNTER — Other Ambulatory Visit: Payer: Self-pay

## 2020-03-04 ENCOUNTER — Non-Acute Institutional Stay: Payer: Medicare HMO | Admitting: Adult Health Nurse Practitioner

## 2020-03-04 DIAGNOSIS — M25462 Effusion, left knee: Secondary | ICD-10-CM | POA: Diagnosis not present

## 2020-03-04 DIAGNOSIS — M25562 Pain in left knee: Secondary | ICD-10-CM

## 2020-03-04 DIAGNOSIS — R69 Illness, unspecified: Secondary | ICD-10-CM | POA: Diagnosis not present

## 2020-03-04 DIAGNOSIS — Z515 Encounter for palliative care: Secondary | ICD-10-CM | POA: Diagnosis not present

## 2020-03-04 DIAGNOSIS — F0150 Vascular dementia without behavioral disturbance: Secondary | ICD-10-CM

## 2020-03-04 NOTE — Progress Notes (Signed)
° ° °  [facility] Consult Note Telephone: [phone]  Fax: [phone]  PATIENT NAME: Aimee Kirk DOB: 10-23-1922 MRN: [id number]  PRIMARY CARE PROVIDER:   [facility]  REFERRING PROVIDER:  [facility]  RESPONSIBLE PARTY:   Kaizlee Carlino, son [phone] Eddis Pingleton, daughter-in-law [phone]  RECOMMENDATIONS and PLAN:  1.  Advanced care planning.  Patient is DNR  2.  Functional status.  Patient is able to ambulate short distances without assistive devices.  She is able to go to the bathroom when she needs to.  Does require minimal assistance with bathing and dressing.  Able to feed herself.  Patient's appetite is good with increase in weight, see below.  Continue supportive care at facility.  3.  Pain.  Staff does report that this morning she was complaining of left knee pain and swelling and unable to bear weight on left leg.  Son reports that this is what he was updated on by the facility staff.  Patient at time of my visit was not complaining of pain and was able to get up and walk on left leg without any complaints of pain.  Doctors making house calls made aware of the complaint.  Continue follow-up and recommendations by doctors making house calls.  Palliative continue to monitor for symptom management/decline and make recommendations as needed.  Follow-up in 6 to 8 weeks.  Son encouraged to call with any questions or concerns.  I spent 30  minutes providing this consultation,  from 3:30 to 4:00 including time spent with patient/family, chart review, provider coordination, documentation. More than 50% of the time in this consultation was spent coordinating communication.   HISTORY OF PRESENT ILLNESS:  Aimee Kirk is a 86 y.o. year old female with multiple medical problems including dementia, anorexia, end-stage cerebrovascular disease, CHF. Palliative Care was asked to help address goals of care.  Patient was admitted to hospice  12/07/2019 with a decline in her functional status from a PPS of 60% to a PPS of 40%.  Upon admission to hospice her weight was 98.4 pounds with BMI of 18.1.  Current weight now is 107 pounds with BMI of 19.7.  Denies increased shortness of breath or cough, headaches, dizziness, N/V/D, constipation, dysuria.   CODE STATUS: DNR  PPS: 40% HOSPICE ELIGIBILITY/DIAGNOSIS: TBD  PHYSICAL EXAM:  HR  96  O2 97% on RA General: NAD, frail appearing, thin Cardiovascular: regular rate and rhythm Pulmonary: Lung sounds clear; normal respiratory effort Abdomen: soft, nontender, + bowel sounds GU: no suprapubic tenderness Extremities: no edema, some swelling noted to left knee with no erythema or warmth to touch noted Skin: no rashes on exposed skin Neurological: Weakness; A&O to person and place   PAST MEDICAL HISTORY:  Past Medical History:  Diagnosis Date   Thyroid disease     SOCIAL HX:  Social History   Tobacco Use   Smoking status: Never Smoker   Smokeless tobacco: Never Used  Substance Use Topics   Alcohol use: Never    ALLERGIES: No Known Allergies   PERTINENT MEDICATIONS:  Outpatient Encounter Medications as of 03/04/2020  Medication Sig   ARMOUR THYROID 60 MG tablet TAKE 1 TABLET DAILY 1 HOUR BEFORE BREAKFAST   Multiple Vitamin (MULTIVITAMIN) tablet Take 1 tablet by mouth daily.   No facility-administered encounter medications on file as of 03/04/2020.     Milayna Rotenberg Jenetta Downer, NP

## 2020-03-05 ENCOUNTER — Emergency Department: Payer: Medicare Other

## 2020-03-05 ENCOUNTER — Other Ambulatory Visit: Payer: Self-pay

## 2020-03-05 ENCOUNTER — Inpatient Hospital Stay: Payer: Medicare Other

## 2020-03-05 ENCOUNTER — Inpatient Hospital Stay
Admission: EM | Admit: 2020-03-05 | Discharge: 2020-03-08 | DRG: 281 | Disposition: A | Discharge status: Skilled Nursing Facility | Payer: Medicare Other | Attending: Internal Medicine | Admitting: Internal Medicine

## 2020-03-05 ENCOUNTER — Encounter: Payer: Self-pay | Admitting: Internal Medicine

## 2020-03-05 ENCOUNTER — Emergency Department
Admission: EM | Admit: 2020-03-05 | Discharge: 2020-03-05 | Disposition: A | Discharge status: Home / Self Care | Payer: Medicare Other | Source: Home / Self Care | Attending: Emergency Medicine | Admitting: Emergency Medicine

## 2020-03-05 DIAGNOSIS — S0990XA Unspecified injury of head, initial encounter: Secondary | ICD-10-CM

## 2020-03-05 DIAGNOSIS — Z8249 Family history of ischemic heart disease and other diseases of the circulatory system: Secondary | ICD-10-CM | POA: Diagnosis not present

## 2020-03-05 DIAGNOSIS — I361 Nonrheumatic tricuspid (valve) insufficiency: Secondary | ICD-10-CM | POA: Diagnosis not present

## 2020-03-05 DIAGNOSIS — Y92099 Unspecified place in other non-institutional residence as the place of occurrence of the external cause: Secondary | ICD-10-CM

## 2020-03-05 DIAGNOSIS — E872 Acidosis, unspecified: Secondary | ICD-10-CM | POA: Insufficient documentation

## 2020-03-05 DIAGNOSIS — Z8261 Family history of arthritis: Secondary | ICD-10-CM | POA: Diagnosis not present

## 2020-03-05 DIAGNOSIS — R739 Hyperglycemia, unspecified: Secondary | ICD-10-CM | POA: Diagnosis present

## 2020-03-05 DIAGNOSIS — N939 Abnormal uterine and vaginal bleeding, unspecified: Secondary | ICD-10-CM | POA: Diagnosis present

## 2020-03-05 DIAGNOSIS — I249 Acute ischemic heart disease, unspecified: Secondary | ICD-10-CM | POA: Diagnosis present

## 2020-03-05 DIAGNOSIS — R6510 Systemic inflammatory response syndrome (SIRS) of non-infectious origin without acute organ dysfunction: Secondary | ICD-10-CM | POA: Diagnosis present

## 2020-03-05 DIAGNOSIS — F0390 Unspecified dementia without behavioral disturbance: Secondary | ICD-10-CM | POA: Diagnosis not present

## 2020-03-05 DIAGNOSIS — I082 Rheumatic disorders of both aortic and tricuspid valves: Secondary | ICD-10-CM | POA: Diagnosis present

## 2020-03-05 DIAGNOSIS — E079 Disorder of thyroid, unspecified: Secondary | ICD-10-CM | POA: Diagnosis present

## 2020-03-05 DIAGNOSIS — R748 Abnormal levels of other serum enzymes: Secondary | ICD-10-CM

## 2020-03-05 DIAGNOSIS — G309 Alzheimer's disease, unspecified: Secondary | ICD-10-CM | POA: Diagnosis not present

## 2020-03-05 DIAGNOSIS — Z79890 Hormone replacement therapy (postmenopausal): Secondary | ICD-10-CM | POA: Insufficient documentation

## 2020-03-05 DIAGNOSIS — Y92009 Unspecified place in unspecified non-institutional (private) residence as the place of occurrence of the external cause: Secondary | ICD-10-CM | POA: Diagnosis not present

## 2020-03-05 DIAGNOSIS — Z79899 Other long term (current) drug therapy: Secondary | ICD-10-CM

## 2020-03-05 DIAGNOSIS — F0280 Dementia in other diseases classified elsewhere without behavioral disturbance: Secondary | ICD-10-CM | POA: Diagnosis not present

## 2020-03-05 DIAGNOSIS — W1830XA Fall on same level, unspecified, initial encounter: Secondary | ICD-10-CM | POA: Diagnosis present

## 2020-03-05 DIAGNOSIS — A419 Sepsis, unspecified organism: Secondary | ICD-10-CM

## 2020-03-05 DIAGNOSIS — W1839XA Other fall on same level, initial encounter: Secondary | ICD-10-CM | POA: Insufficient documentation

## 2020-03-05 DIAGNOSIS — M6282 Rhabdomyolysis: Secondary | ICD-10-CM | POA: Diagnosis present

## 2020-03-05 DIAGNOSIS — I214 Non-ST elevation (NSTEMI) myocardial infarction: Secondary | ICD-10-CM | POA: Diagnosis not present

## 2020-03-05 DIAGNOSIS — T796XXA Traumatic ischemia of muscle, initial encounter: Secondary | ICD-10-CM

## 2020-03-05 DIAGNOSIS — Z20822 Contact with and (suspected) exposure to covid-19: Secondary | ICD-10-CM | POA: Diagnosis present

## 2020-03-05 DIAGNOSIS — E039 Hypothyroidism, unspecified: Secondary | ICD-10-CM | POA: Insufficient documentation

## 2020-03-05 DIAGNOSIS — W19XXXA Unspecified fall, initial encounter: Secondary | ICD-10-CM

## 2020-03-05 DIAGNOSIS — Z043 Encounter for examination and observation following other accident: Secondary | ICD-10-CM | POA: Diagnosis not present

## 2020-03-05 DIAGNOSIS — D72829 Elevated white blood cell count, unspecified: Secondary | ICD-10-CM

## 2020-03-05 DIAGNOSIS — Z66 Do not resuscitate: Secondary | ICD-10-CM | POA: Diagnosis present

## 2020-03-05 DIAGNOSIS — R531 Weakness: Secondary | ICD-10-CM | POA: Diagnosis not present

## 2020-03-05 DIAGNOSIS — I2510 Atherosclerotic heart disease of native coronary artery without angina pectoris: Secondary | ICD-10-CM | POA: Diagnosis not present

## 2020-03-05 DIAGNOSIS — R0902 Hypoxemia: Secondary | ICD-10-CM | POA: Diagnosis not present

## 2020-03-05 DIAGNOSIS — H9190 Unspecified hearing loss, unspecified ear: Secondary | ICD-10-CM | POA: Diagnosis present

## 2020-03-05 DIAGNOSIS — M542 Cervicalgia: Secondary | ICD-10-CM | POA: Diagnosis not present

## 2020-03-05 DIAGNOSIS — R52 Pain, unspecified: Secondary | ICD-10-CM | POA: Diagnosis not present

## 2020-03-05 DIAGNOSIS — R7989 Other specified abnormal findings of blood chemistry: Secondary | ICD-10-CM

## 2020-03-05 DIAGNOSIS — R079 Chest pain, unspecified: Secondary | ICD-10-CM | POA: Diagnosis not present

## 2020-03-05 DIAGNOSIS — I248 Other forms of acute ischemic heart disease: Secondary | ICD-10-CM | POA: Diagnosis not present

## 2020-03-05 DIAGNOSIS — R102 Pelvic and perineal pain: Secondary | ICD-10-CM | POA: Diagnosis not present

## 2020-03-05 DIAGNOSIS — R778 Other specified abnormalities of plasma proteins: Secondary | ICD-10-CM

## 2020-03-05 DIAGNOSIS — R69 Illness, unspecified: Secondary | ICD-10-CM | POA: Diagnosis not present

## 2020-03-05 DIAGNOSIS — Y92002 Bathroom of unspecified non-institutional (private) residence single-family (private) house as the place of occurrence of the external cause: Secondary | ICD-10-CM | POA: Insufficient documentation

## 2020-03-05 DIAGNOSIS — J984 Other disorders of lung: Secondary | ICD-10-CM | POA: Diagnosis not present

## 2020-03-05 DIAGNOSIS — I700 Atherosclerosis of aorta: Secondary | ICD-10-CM | POA: Diagnosis not present

## 2020-03-05 LAB — HEPATIC FUNCTION PANEL
ALT: 39 U/L (ref 0–44)
AST: 119 U/L — ABNORMAL HIGH (ref 15–41)
Albumin: 4.3 g/dL (ref 3.5–5.0)
Alkaline Phosphatase: 83 U/L (ref 38–126)
Bilirubin, Direct: 0.1 mg/dL (ref 0.0–0.2)
Indirect Bilirubin: 0.8 mg/dL (ref 0.3–0.9)
Total Bilirubin: 0.9 mg/dL (ref 0.3–1.2)
Total Protein: 7.5 g/dL (ref 6.5–8.1)

## 2020-03-05 LAB — URINALYSIS, COMPLETE (UACMP) WITH MICROSCOPIC
Bacteria, UA: NONE SEEN
Bilirubin Urine: NEGATIVE
Glucose, UA: NEGATIVE mg/dL
Ketones, ur: NEGATIVE mg/dL
Leukocytes,Ua: NEGATIVE
Nitrite: NEGATIVE
Protein, ur: 30 mg/dL — AB
Specific Gravity, Urine: 1.039 — ABNORMAL HIGH (ref 1.005–1.030)
pH: 5 (ref 5.0–8.0)

## 2020-03-05 LAB — BASIC METABOLIC PANEL
Anion gap: 12 (ref 5–15)
BUN: 24 mg/dL — ABNORMAL HIGH (ref 8–23)
CO2: 24 mmol/L (ref 22–32)
Calcium: 9.6 mg/dL (ref 8.9–10.3)
Chloride: 105 mmol/L (ref 98–111)
Creatinine, Ser: 0.91 mg/dL (ref 0.44–1.00)
GFR, Estimated: 54 mL/min — ABNORMAL LOW (ref >60)
Glucose, Bld: 144 mg/dL — ABNORMAL HIGH (ref 70–99)
Potassium: 4 mmol/L (ref 3.5–5.1)
Sodium: 141 mmol/L (ref 135–145)

## 2020-03-05 LAB — PROTIME-INR
INR: 1 (ref 0.8–1.2)
Prothrombin Time: 12.6 seconds (ref 11.4–15.2)

## 2020-03-05 LAB — LACTIC ACID, PLASMA
Lactic Acid, Venous: 2.3 mmol/L (ref 0.5–1.9)
Lactic Acid, Venous: 1.4 mmol/L (ref 0.5–1.9)

## 2020-03-05 LAB — CBC
HCT: 42.7 % (ref 36.0–46.0)
Hemoglobin: 13.8 g/dL (ref 12.0–15.0)
MCH: 30.9 pg (ref 26.0–34.0)
MCHC: 32.3 g/dL (ref 30.0–36.0)
MCV: 95.7 fL (ref 80.0–100.0)
Platelets: 284 10*3/uL (ref 150–400)
RBC: 4.46 MIL/uL (ref 3.87–5.11)
RDW: 13.9 % (ref 11.5–15.5)
WBC: 18.8 10*3/uL — ABNORMAL HIGH (ref 4.0–10.5)
nRBC: 0 % (ref 0.0–0.2)

## 2020-03-05 LAB — TROPONIN I (HIGH SENSITIVITY)
Troponin I (High Sensitivity): 7220 ng/L (ref <18)
Troponin I (High Sensitivity): 8965 ng/L (ref <18)
Troponin I (High Sensitivity): 9878 ng/L (ref <18)

## 2020-03-05 LAB — CK: Total CK: 6320 U/L — ABNORMAL HIGH (ref 38–234)

## 2020-03-05 LAB — PROCALCITONIN: Procalcitonin: 0.6 ng/mL

## 2020-03-05 LAB — RESPIRATORY PANEL BY RT PCR (FLU A&B, COVID)
Influenza A by PCR: NEGATIVE
Influenza B by PCR: NEGATIVE
SARS Coronavirus 2 by RT PCR: NEGATIVE

## 2020-03-05 LAB — TSH: TSH: 3.598 u[IU]/mL (ref 0.350–4.500)

## 2020-03-05 LAB — FIBRIN DERIVATIVES D-DIMER (ARMC ONLY): Fibrin derivatives D-dimer (ARMC): 1187.75 ng/mL (FEU) — ABNORMAL HIGH (ref 0.00–499.00)

## 2020-03-05 LAB — MAGNESIUM: Magnesium: 2.4 mg/dL (ref 1.7–2.4)

## 2020-03-05 LAB — APTT: aPTT: 30 seconds (ref 24–36)

## 2020-03-05 MED ORDER — SODIUM CHLORIDE 0.9 % IV SOLN
2.0000 g | INTRAVENOUS | Status: DC
  Administered 2020-03-05: 2 g via INTRAVENOUS
  Filled 2020-03-05: qty 2

## 2020-03-05 MED ORDER — IOHEXOL 350 MG/ML SOLN
75.0000 mL | Freq: Once | INTRAVENOUS | Status: AC | PRN
  Administered 2020-03-05: 75 mL via INTRAVENOUS

## 2020-03-05 MED ORDER — INSULIN ASPART 100 UNIT/ML ~~LOC~~ SOLN: 0.0000 [IU] | Freq: Every day | SUBCUTANEOUS | Status: DC

## 2020-03-05 MED ORDER — ASPIRIN 325 MG PO TABS
325.0000 mg | ORAL_TABLET | Freq: Once | ORAL | Status: DC
  Filled 2020-03-05: qty 1

## 2020-03-05 MED ORDER — ASPIRIN EC 325 MG PO TBEC: 325.0000 mg | DELAYED_RELEASE_TABLET | Freq: Once | ORAL | Status: DC

## 2020-03-05 MED ORDER — HEPARIN BOLUS VIA INFUSION
2900.0000 [IU] | Freq: Once | INTRAVENOUS | Status: AC
  Administered 2020-03-05: 2900 [IU] via INTRAVENOUS
  Filled 2020-03-05: qty 2900

## 2020-03-05 MED ORDER — ASPIRIN 81 MG PO CHEW
CHEWABLE_TABLET | ORAL | Status: AC
  Filled 2020-03-05: qty 4

## 2020-03-05 MED ORDER — HEPARIN (PORCINE) 25000 UT/250ML-% IV SOLN
850.0000 [IU]/h | INTRAVENOUS | Status: AC
  Administered 2020-03-05: 550 [IU]/h via INTRAVENOUS
  Administered 2020-03-07: 850 [IU]/h via INTRAVENOUS
  Filled 2020-03-05 (×2): qty 250

## 2020-03-05 MED ORDER — ATORVASTATIN CALCIUM 20 MG PO TABS
40.0000 mg | ORAL_TABLET | Freq: Every day | ORAL | Status: DC
  Administered 2020-03-05: 40 mg via ORAL
  Filled 2020-03-05: qty 2

## 2020-03-05 MED ORDER — INSULIN ASPART 100 UNIT/ML ~~LOC~~ SOLN
0.0000 [IU] | Freq: Three times a day (TID) | SUBCUTANEOUS | Status: DC
  Administered 2020-03-06 – 2020-03-07 (×2): 2 [IU] via SUBCUTANEOUS
  Filled 2020-03-05 (×2): qty 1

## 2020-03-05 MED ORDER — ASPIRIN 81 MG PO CHEW
81.0000 mg | CHEWABLE_TABLET | Freq: Every day | ORAL | Status: DC
  Administered 2020-03-07 – 2020-03-08 (×2): 81 mg via ORAL
  Filled 2020-03-05 (×3): qty 1

## 2020-03-05 MED ORDER — METOPROLOL TARTRATE 25 MG PO TABS
25.0000 mg | ORAL_TABLET | Freq: Two times a day (BID) | ORAL | Status: DC
  Administered 2020-03-05 – 2020-03-06 (×3): 25 mg via ORAL
  Filled 2020-03-05 (×3): qty 1

## 2020-03-05 MED ORDER — LACTATED RINGERS IV BOLUS
1000.0000 mL | Freq: Once | INTRAVENOUS | Status: AC
  Administered 2020-03-05: 1000 mL via INTRAVENOUS

## 2020-03-05 MED ORDER — LACTATED RINGERS IV BOLUS (SEPSIS)
500.0000 mL | Freq: Once | INTRAVENOUS | Status: AC
  Administered 2020-03-05: 500 mL via INTRAVENOUS

## 2020-03-05 MED ORDER — LACTATED RINGERS IV BOLUS: 500.0000 mL | Freq: Once | INTRAVENOUS | Status: DC

## 2020-03-05 NOTE — ED Triage Notes (Signed)
Pt here with weakness after being d/c from here today. Per son, pt was not able to stand and get in car which is not normal for her. Pt does not have any leg pain. Pt NAD in triage.

## 2020-03-05 NOTE — ED Triage Notes (Signed)
Pt to ED via [facility] from home. Facility stating pt had a witnessed fall between the toilet and railing of the wall. Staff stating when getting pt up they heard a crack. Per facility pt has no medical hx, no blood thinner use and only came with a pt face sheet as paperwork. Per EMS facility also stating pt recently came off of hospice care prior to getting to their facility. VSS in route. Pt c/o neck pain with EMS.   Upon arrival pt with c-collar in place. Pt very hard of hearing. Pt stating she didn't fall and has no complaints.

## 2020-03-05 NOTE — ED Provider Notes (Signed)
[facility] Emergency Department Provider Note   ____________________________________________   First MD Initiated Contact with Patient 03/05/20 [phone]     (approximate)  I have reviewed the triage vital signs and the nursing notes.   HISTORY  Chief Complaint Fall    HPI Aimee Kirk is a 86 y.o. female with unknown past medical history who presents from a assisted living facility after a mechanical fall.  Patient was found against the railing in a bathroom.  Per EMS, staff heard a "pop" and was concerned that patient had broken something for calling EMS.  Patient is extremely hard of hearing and cannot participate in history or review of systems.         Past Medical History:  Diagnosis Date  . Thyroid disease     There are no problems to display for this patient.   Past Surgical History:  Procedure Laterality Date  . ABDOMINAL HYSTERECTOMY      Prior to Admission medications   Medication Sig Start Date End Date Taking? Authorizing Provider  ARMOUR THYROID 60 MG tablet TAKE 1 TABLET DAILY 1 HOUR BEFORE BREAKFAST 07/06/17   Ronnell Freshwater, NP  Multiple Vitamin (MULTIVITAMIN) tablet Take 1 tablet by mouth daily.    [provider]    Allergies Patient has no known allergies.  Family History  Problem Relation Age of Onset  . Arthritis Mother   . Heart disease Father     Social History Social History   Tobacco Use  . Smoking status: Never Smoker  . Smokeless tobacco: Never Used  Vaping Use  . Vaping Use: Never used  Substance Use Topics  . Alcohol use: Never  . Drug use: Never    Review of Systems Unable to assess.  ____________________________________________   PHYSICAL EXAM:  VITAL SIGNS: ED Triage Vitals  Enc Vitals Group     BP      Pulse      Resp      Temp      Temp src      SpO2      Weight      Height      Head Circumference      Peak Flow      Pain Score      Pain Loc      Pain Edu?        Excl. in [place]?    Constitutional: Awake and alert. Well appearing and in no acute distress. Eyes: Conjunctivae are normal. PERRL. Head: Eschar over right lateral eyebrow with surrounding erythema Nose: No congestion/rhinnorhea. Mouth/Throat: Mucous membranes are moist. Neck: No stridor, c-collar in place Cardiovascular: Grossly normal heart sounds.  Good peripheral circulation. Respiratory: Normal respiratory effort.  No retractions. Gastrointestinal: Soft and nontender. No distention. Musculoskeletal: No obvious deformities Neurologic:  Normal speech and language.  Moving all extremities spontaneously.  Extremely hard of hearing Skin:  Skin is warm and dry. No rash noted. Psychiatric: Mood and affect are normal. Speech and behavior are normal.  ____________________________________________   LABS (all labs ordered are listed, but only abnormal results are displayed)  Labs Reviewed - No data to display  RADIOLOGY  ED MD interpretation: CT without contrast of the head and cervical spine show no evidence of acute abnormalities including no active bleeding, fractures, or dislocations  Official radiology report(s): CT Head Wo Contrast  Result Date: 03/05/2020 CLINICAL DATA:  Un witnessed fall EXAM: CT HEAD WITHOUT CONTRAST TECHNIQUE: Contiguous axial images were obtained from  the base of the skull through the vertex without intravenous contrast. COMPARISON:  None. FINDINGS: Brain: There is no acute intracranial hemorrhage, mass effect, or edema. Gray-white differentiation is preserved. There is no extra-axial fluid collection. Prominence of the ventricles and sulci reflects generalized parenchymal volume loss. Patchy hypoattenuation in the supratentorial white matter is nonspecific but may reflect mild chronic microvascular ischemic changes. Vascular: There is atherosclerotic calcification at the skull base. Skull: Calvarium is unremarkable. Sinuses/Orbits: No acute finding. Other: None.  IMPRESSION: No evidence of acute intracranial injury. Electronically Signed   By: Macy Mis M.D.   On: 03/05/2020 10:40   CT Cervical Spine Wo Contrast  Result Date: 03/05/2020 CLINICAL DATA:  Fall EXAM: CT CERVICAL SPINE WITHOUT CONTRAST TECHNIQUE: Multidetector CT imaging of the cervical spine was performed without intravenous contrast. Multiplanar CT image reconstructions were also generated. COMPARISON:  None. FINDINGS: Alignment: Minor degenerative listhesis. Skull base and vertebrae: No acute cervical spine fracture. Vertebral body heights are maintained apart from degenerative endplate irregularity. Soft tissues and spinal canal: No prevertebral fluid or swelling. No visible canal hematoma. Disc levels: Multilevel degenerative changes are present including disc space narrowing, endplate osteophytes, and facet and uncovertebral hypertrophy. Upper chest: Nodular scarring at the lung apices. There is 1.1 cm adjacent nodularity within the right upper lobe. Other: Calcified plaque at the common carotid bifurcations. IMPRESSION: No acute cervical spine fracture. 1.1 cm area of nodularity within the right upper lobe. May reflect scarring or neoplastic process. Follow-up as indicated given patient age. Electronically Signed   By: Macy Mis M.D.   On: 03/05/2020 10:47    ____________________________________________   PROCEDURES  Procedure(s) performed (including Critical Care):  Procedures   ____________________________________________   INITIAL IMPRESSION / ASSESSMENT AND PLAN / ED COURSE  As part of my medical decision making, I reviewed the following data within the [place] notes reviewed and incorporated, Labs reviewed, Old chart reviewed, Radiograph reviewed and Notes from prior ED visits reviewed and incorporated        This 86 year old female presents with head trauma after a mechanical GLF. DDX includes MSK trauma, facial fractures, ICH or  traumatic SAH, C-spine injury. Doubt other extracranial causes of injury. Considered nonmechanical causes of fall such as syncope, primary cardiopulmonary etiologies such as ACS/PE, but think these are unlikely. Will get head/face/neck CT, pain control, C-collar, basic labs, reassess, discharge      ____________________________________________   FINAL CLINICAL IMPRESSION(S) / ED DIAGNOSES  Final diagnoses:  Fall, initial encounter  Injury of head, initial encounter     ED Discharge Orders    None       Note:  This document was prepared using Dragon voice recognition software and may include unintentional dictation errors.   Naaman Plummer, MD 03/05/20 1100

## 2020-03-05 NOTE — ED Notes (Signed)
Date and time results received: 03/05/20 4:11 PM  Test: Troponin Critical Value: 7220  Name of Provider Notified: Tamala Julian  MD to follow up

## 2020-03-05 NOTE — ED Notes (Signed)
Date and time results received: 03/05/20 1606   Test: Lactic Acid Critical Value: 2.3  Name of Provider Notified:  Brandler  MD to follow up

## 2020-03-05 NOTE — Progress Notes (Addendum)
ANTICOAGULATION CONSULT NOTE  Pharmacy Consult for Heparin infusion Indication: ACS/STEMI  No Known Allergies  Patient Measurements: Height: 5\' 4"  (162.6 cm) Weight: 48.1 kg (106 lb) IBW/kg (Calculated) : 54.7  Vital Signs: Temp: 98.1 F (36.7 C) (10/20 1453) Temp Source: Oral (10/20 1453) BP: 149/72 (10/20 1242) Pulse Rate: 82 (10/20 1453)  Labs: Recent Labs    03/05/20 1245 03/05/20 1530  HGB 13.8  --   HCT 42.7  --   PLT 284  --   CREATININE 0.91  --   CKTOTAL  --  6,320*  TROPONINIHS  --  7,220*    Estimated Creatinine Clearance: 28.1 mL/min (by C-G formula based on SCr of 0.91 mg/dL).   Medical History: Past Medical History:  Diagnosis Date  . Thyroid disease     Medications:  Confirmed with patient's son that patient was not on any anticoagulation prior to admission  Assessment: 86yo female with PMH dementia who was seen earlier today after a fall resulting in head injury. Patient was brought back to ED for further eval by son who stated that she was "unable to stand and get in the car which is not normal for her". CT head showed no evidence of acute intracranial injury. 10/20 Troponin HS 7220, CK 6320; ECG Inferolateral injury pattern ACUTE MI / STEMI. Pharmacy has been consulted for heparin dosing and monitoring.   Baseline CBC WNL; aPTT and PT-INR pending  Goal of Therapy:  Heparin level 0.3-0.7 units/ml Monitor platelets by anticoagulation protocol: Yes   Plan:  Give 2900 units bolus x 1 Start heparin infusion at 550 units/hr Check anti-Xa level in 8 hours and daily while on heparin Continue to monitor H&H and platelets  Ordered aPTT and PT-INR  Sherilyn Banker, PharmD Pharmacy Resident  03/05/2020 4:46 PM

## 2020-03-05 NOTE — Progress Notes (Signed)
Pharmacy Antibiotic Note  Aimee Kirk is a 86 y.o. female PMH dementia admitted on 03/05/2020 with sepsis who was seen earlier today after a fall resulting in head injury. Patient was brought back to ED for further eval by son who stated that she was "unable to stand and get in the car which is not normal for her". Patient also being treated for ACS/STEMI. CT head showed no evidence of acute intracranial injury. WBC 18.8, lactic acid 2.3, procal 0.6, afebrile, Scr 0.91. Pharmacy has been consulted for Cefepime dosing.  Plan: Start Cefepime 2g IV q24 hours Monitor renal function and adjust dose as clinically indicated.   Height: 5\' 4"  (162.6 cm) Weight: 48.1 kg (106 lb) IBW/kg (Calculated) : 54.7  Temp (24hrs), Avg:98 F (36.7 C), Min:97.8 F (36.6 C), Max:98.2 F (36.8 C)  Recent Labs  Lab 03/05/20 1245 03/05/20 1530  WBC 18.8*  --   CREATININE 0.91  --   LATICACIDVEN  --  2.3*    Estimated Creatinine Clearance: 28.1 mL/min (by C-G formula based on SCr of 0.91 mg/dL).    Allergies  Allergen Reactions  . Remdesivir     Pt son states he absolutely does not want his mother to receive this medication     Antimicrobials this admission: 10/20 Cefepime >>    Microbiology results: 10/20 BCx: pending 10/20 UCx: pending  Thank you for allowing pharmacy to be a part of this patient's care.  Sherilyn Banker, PharmD Pharmacy Resident  03/05/2020 6:03 PM

## 2020-03-05 NOTE — ED Notes (Signed)
Discontinued orthostatic vitalsigns. Patient unable to stand.

## 2020-03-05 NOTE — ED Notes (Signed)
Per family, if covid positive, declines rendesevir treatment.

## 2020-03-05 NOTE — ED Notes (Signed)
[phone] Ray Son to call if any issues

## 2020-03-05 NOTE — H&P (Signed)
[place] [facility] [place] NAME: Aimee Kirk    MR#:  [id number]  DATE OF BIRTH:  1923/03/23  DATE OF ADMISSION:  03/05/2020  PRIMARY CARE PHYSICIAN: [facility]   REQUESTING/REFERRING PHYSICIAN: Dr Tamala Julian  Patient coming from :[facility]  Patient walks without a walker. She needs little help with her ADLs. CHIEF COMPLAINT:  fall and weakness history is obtained from patient's son who was present in the ER. HISTORY OF PRESENT ILLNESS:  Aimee Kirk  is a 86 y.o. female with a known history of dementia who is a resident at [facility] comes in after she had a fall at her assisted living. Patient was evaluated in the emergency room her imaging studies were negative. She was discharged to go back to home [facility] this morning. Patient son was not able to get her out of the car and stand up when he took her back. He brought her back to the emergency room for further evaluation.  ED course: the ER patient was found to be tachycardic. Her depression was stable. Respiratory rate 16. To have CK total of 6320 and troponin of 7200. Lactic acid was 2.3 and Pro calcitonin was .60. White count 18.8. EKG showed 1 mm ST elevation and we five E. six. No chest pain.  Patient was seen by Dr. Saralyn Pilar who discussed with patient's son and conservative management is recommended. Cold STEMI was not initiated.  CT chest to rule out PE is pending, blood culture and urine cultures have been ordered. UA pending.  Patient is currently getting IV fluids, IV heparin drip. She is being admitted with acute coronary syndrome and sepsis with unknown etiology.  Her son patient is a DNR. She follows with palliative care as outpatient PAST MEDICAL HISTORY:   Past Medical History:  Diagnosis Date  . Thyroid disease     PAST SURGICAL HISTOIRY:   Past Surgical History:  Procedure Laterality Date  . ABDOMINAL HYSTERECTOMY      SOCIAL HISTORY:   Social  History   Tobacco Use  . Smoking status: Never Smoker  . Smokeless tobacco: Never Used  Substance Use Topics  . Alcohol use: Never    FAMILY HISTORY:   Family History  Problem Relation Age of Onset  . Arthritis Mother   . Heart disease Father     DRUG ALLERGIES:   Allergies  Allergen Reactions  . Remdesivir     Pt son states he absolutely does not want his mother to receive this medication     REVIEW OF SYSTEMS:  Review of Systems  Unable to perform ROS: Dementia     MEDICATIONS AT HOME:   Prior to Admission medications   Not on File    no meds per son  VITAL SIGNS:  Blood pressure (!) 149/72, pulse 82, temperature 98.1 F (36.7 C), temperature source Oral, resp. rate 16, height 5\' 4"  (1.626 m), weight 48.1 kg, SpO2 94 %.  PHYSICAL EXAMINATION:  GENERAL:  86 y.o.-year-old patient lying in the bed with no acute distress.  EYES: Pupils equal, round, reactive to light and accommodation. No scleral icterus.  HEENT: Head atraumatic, normocephalic. Oropharynx and nasopharynx clear.   LUNGS: Normal breath sounds bilaterally, no wheezing, rales,rhonchi or crepitation. No use of accessory muscles of respiration.  CARDIOVASCULAR: S1, S2 normal. No murmurs, rubs, or gallops.  ABDOMEN: Soft, nontender, nondistended. Bowel sounds present. No organomegaly or mass.  EXTREMITIES: No pedal edema, cyanosis, or clubbing.  NEUROLOGIC:  grossly nonfocal. Moves all extremities well. Limited exam due to underlying dementia  PSYCHIATRIC: The patient is alert and awake . Mood affect normal. She knows her name recognizes her son. SKIN: No obvious rash, lesion, or ulcer.   LABORATORY PANEL:   CBC Recent Labs  Lab 03/05/20 1245  WBC 18.8*  HGB 13.8  HCT 42.7  PLT 284   ------------------------------------------------------------------------------------------------------------------  Chemistries  Recent Labs  Lab 03/05/20 1245 03/05/20 1530  NA 141  --   K 4.0  --   CL 105   --   CO2 24  --   GLUCOSE 144*  --   BUN 24*  --   CREATININE 0.91  --   CALCIUM 9.6  --   MG  --  2.4  AST 119*  --   ALT 39  --   ALKPHOS 83  --   BILITOT 0.9  --    ------------------------------------------------------------------------------------------------------------------  Cardiac Enzymes No results for input(s): TROPONINI in the last 168 hours. ------------------------------------------------------------------------------------------------------------------  RADIOLOGY:  DG Pelvis 1-2 Views  Result Date: 03/05/2020 CLINICAL DATA:  Recent fall with pelvic pain, initial encounter EXAM: PELVIS - 1 VIEW COMPARISON:  None. FINDINGS: Pelvic ring is intact. Degenerative changes of the lumbar spine are noted. Mild degenerative changes of the hip joints are seen. No acute fracture is noted. IMPRESSION: No acute abnormality seen. Electronically Signed   By: Inez Catalina M.D.   On: 03/05/2020 15:26   DG Knee 2 Views Left  Result Date: 03/05/2020 CLINICAL DATA:  Status post fall. EXAM: LEFT KNEE - 1-2 VIEW COMPARISON:  None. FINDINGS: No evidence of an acute fracture or dislocation. Moderate to marked severity medial and lateral tibiofemoral compartment space narrowing is seen. There is a moderate sized joint effusion. IMPRESSION: 1. No evidence of an acute fracture. 2. Moderate to marked severity degenerative changes with a moderate sized joint effusion. Electronically Signed   By: Virgina Norfolk M.D.   On: 03/05/2020 17:42   CT Head Wo Contrast  Result Date: 03/05/2020 CLINICAL DATA:  Un witnessed fall EXAM: CT HEAD WITHOUT CONTRAST TECHNIQUE: Contiguous axial images were obtained from the base of the skull through the vertex without intravenous contrast. COMPARISON:  None. FINDINGS: Brain: There is no acute intracranial hemorrhage, mass effect, or edema. Gray-white differentiation is preserved. There is no extra-axial fluid collection. Prominence of the ventricles and sulci  reflects generalized parenchymal volume loss. Patchy hypoattenuation in the supratentorial white matter is nonspecific but may reflect mild chronic microvascular ischemic changes. Vascular: There is atherosclerotic calcification at the skull base. Skull: Calvarium is unremarkable. Sinuses/Orbits: No acute finding. Other: None. IMPRESSION: No evidence of acute intracranial injury. Electronically Signed   By: Macy Mis M.D.   On: 03/05/2020 10:40   CT Cervical Spine Wo Contrast  Result Date: 03/05/2020 CLINICAL DATA:  Fall EXAM: CT CERVICAL SPINE WITHOUT CONTRAST TECHNIQUE: Multidetector CT imaging of the cervical spine was performed without intravenous contrast. Multiplanar CT image reconstructions were also generated. COMPARISON:  None. FINDINGS: Alignment: Minor degenerative listhesis. Skull base and vertebrae: No acute cervical spine fracture. Vertebral body heights are maintained apart from degenerative endplate irregularity. Soft tissues and spinal canal: No prevertebral fluid or swelling. No visible canal hematoma. Disc levels: Multilevel degenerative changes are present including disc space narrowing, endplate osteophytes, and facet and uncovertebral hypertrophy. Upper chest: Nodular scarring at the lung apices. There is 1.1 cm adjacent nodularity within the right upper lobe. Other: Calcified plaque at the common carotid bifurcations. IMPRESSION: No  acute cervical spine fracture. 1.1 cm area of nodularity within the right upper lobe. May reflect scarring or neoplastic process. Follow-up as indicated given patient age. Electronically Signed   By: Macy Mis M.D.   On: 03/05/2020 10:47   DG Chest Port 1 View  Result Date: 03/05/2020 CLINICAL DATA:  Recent fall with chest pain, initial encounter EXAM: PORTABLE CHEST 1 VIEW COMPARISON:  None. FINDINGS: Cardiac shadow is mildly enlarged. Aortic calcifications are noted. Lungs are well aerated bilaterally. No infiltrate, effusion or pneumothorax  is seen. No acute bony abnormality noted. Degenerative changes about shoulder joints are seen. IMPRESSION: No acute abnormality noted. Electronically Signed   By: Inez Catalina M.D.   On: 03/05/2020 15:27    EKG:    IMPRESSION AND PLAN:   Aimee Kirk  is a 86 y.o. female with a known history of dementia who is a resident at [facility] comes in after she had a fall at her assisted living. Patient was evaluated in the emergency room her imaging studies were negative. She was discharged to go back to home [facility] this morning. Patient son was not able to get her out of the car and stand up when he took her back. He brought her back to the emergency room for further evaluation  Acute coronary syndrome -- patient came in after a fall and weakness. Noted to have elevated troponin of 7200 -seen by Dr. Saralyn Pilar  (STEMI on call )discussed with son and conservative management was recommended. Son agrees. -Continue heparin drip, aspirin, low-dose beta-blocker and statin -cardiology consultation with [facility]. -I have tried to page Dr. Lovena Le listed on amion x2 have not heard back. -Please inform [facility] cardiology in the morning. -Cycle troponin's -echo of the heart   Sepsis with ACS/MI -etiology unknown suspected urine infection -workup still pending. -Follow-up UA, urine culture, blood culture, CT chest -chest x-ray negative for pneumonia -elevated white count of 18, tachycardia, lactic acid more than 2.0 -empiric IV antibiotic with cefepime -pharmacy consult placed -Pro calcitonin 0.6  Hyperglycemia -check A1c -could be stress related -sliding scale insulin  History of dementia without behavior change -supportive care  Elevated fibrin derivative -CT chest pending  Fall with mild rhabdomyolysis -IV fluids -follow CPK -CT head and cervical spine-- no trauma   Family Communication : son in the emergency room Consults : [place] cardiology Dr. Lovena Le paged no response Code  Status : DNR prior to arrival DVT prophylaxis : heparin drip admission status: inpatient patient came in with fall found to have acute MI/ACS and sepsis of unknown etiology. She will at least require two midnight stay in the hospital.  TOTAL TIME TAKING CARE OF THIS PATIENT: **55* minutes.    Fritzi Mandes M.D  [facility]     CC: Primary care physician; [facility]

## 2020-03-05 NOTE — ED Provider Notes (Signed)
[facility] Emergency Department Provider Note   ____________________________________________   First MD Initiated Contact with Patient 03/05/20 1435     (approximate)  I have reviewed the triage vital signs and the nursing notes.   HISTORY  Chief Complaint Weakness    HPI Aimee Kirk is a 86 y.o. female with a past medical history of dementia who was seen here earlier today after a fall from standing resulting in head injury.  Patient was brought back to the emergency department for further evaluation by patient's son stating that she was "unable to stand and get in the car which is not normal for her".  Of note, EMS stated during her first visit that patient was having difficulty with ambulation but could not tell whether this was patient's baseline.  Patient has no complaints at this time         Past Medical History:  Diagnosis Date  . Thyroid disease     There are no problems to display for this patient.   Past Surgical History:  Procedure Laterality Date  . ABDOMINAL HYSTERECTOMY      Prior to Admission medications   Medication Sig Start Date End Date Taking? Authorizing Provider  ARMOUR THYROID 60 MG tablet TAKE 1 TABLET DAILY 1 HOUR BEFORE BREAKFAST 07/06/17   Ronnell Freshwater, NP  Multiple Vitamin (MULTIVITAMIN) tablet Take 1 tablet by mouth daily.    [provider]    Allergies Patient has no known allergies.  Family History  Problem Relation Age of Onset  . Arthritis Mother   . Heart disease Father     Social History Social History   Tobacco Use  . Smoking status: Never Smoker  . Smokeless tobacco: Never Used  Vaping Use  . Vaping Use: Never used  Substance Use Topics  . Alcohol use: Never  . Drug use: Never    Review of Systems Unable to assess   ____________________________________________   PHYSICAL EXAM:  VITAL SIGNS: ED Triage Vitals  Enc Vitals Group     BP 03/05/20 1242 (!) 149/72       Pulse Rate 03/05/20 1242 (!) 109     Resp 03/05/20 1242 16     Temp 03/05/20 1242 98.2 F (36.8 C)     Temp Source 03/05/20 1242 Oral     SpO2 03/05/20 1242 94 %     Weight 03/05/20 1238 106 lb (48.1 kg)     Height 03/05/20 1238 5\' 4"  (1.626 m)     Head Circumference --      Peak Flow --      Pain Score 03/05/20 1238 0     Pain Loc --      Pain Edu? --      Excl. in [place]? --    Constitutional: Alert and oriented. Well appearing and in no acute distress. Eyes: Conjunctivae are normal. PERRL. Head: Atraumatic. Nose: No congestion/rhinnorhea. Mouth/Throat: Mucous membranes are moist. Neck: No stridor Cardiovascular: Grossly normal heart sounds.  Good peripheral circulation. Respiratory: Normal respiratory effort.  No retractions. Gastrointestinal: Soft and nontender. No distention. Musculoskeletal: No obvious deformities Neurologic:  Normal speech and language. No gross focal neurologic deficits are appreciated. Skin:  Skin is warm and dry. No rash noted. Psychiatric: Mood and affect are normal. Speech and behavior are normal.  ____________________________________________   LABS (all labs ordered are listed, but only abnormal results are displayed)  Labs Reviewed  BASIC METABOLIC PANEL - Abnormal; Notable for the following components:  Result Value   Glucose, Bld 144 (*)    BUN 24 (*)    GFR, Estimated 54 (*)    All other components within normal limits  CBC - Abnormal; Notable for the following components:   WBC 18.8 (*)    All other components within normal limits  URINALYSIS, COMPLETE (UACMP) WITH MICROSCOPIC  CBG MONITORING, ED    RADIOLOGY  ED MD interpretation:   Official radiology report(s): CT Head Wo Contrast  Result Date: 03/05/2020 CLINICAL DATA:  Un witnessed fall EXAM: CT HEAD WITHOUT CONTRAST TECHNIQUE: Contiguous axial images were obtained from the base of the skull through the vertex without intravenous contrast. COMPARISON:  None. FINDINGS:  Brain: There is no acute intracranial hemorrhage, mass effect, or edema. Gray-white differentiation is preserved. There is no extra-axial fluid collection. Prominence of the ventricles and sulci reflects generalized parenchymal volume loss. Patchy hypoattenuation in the supratentorial white matter is nonspecific but may reflect mild chronic microvascular ischemic changes. Vascular: There is atherosclerotic calcification at the skull base. Skull: Calvarium is unremarkable. Sinuses/Orbits: No acute finding. Other: None. IMPRESSION: No evidence of acute intracranial injury. Electronically Signed   By: Macy Mis M.D.   On: 03/05/2020 10:40   CT Cervical Spine Wo Contrast  Result Date: 03/05/2020 CLINICAL DATA:  Fall EXAM: CT CERVICAL SPINE WITHOUT CONTRAST TECHNIQUE: Multidetector CT imaging of the cervical spine was performed without intravenous contrast. Multiplanar CT image reconstructions were also generated. COMPARISON:  None. FINDINGS: Alignment: Minor degenerative listhesis. Skull base and vertebrae: No acute cervical spine fracture. Vertebral body heights are maintained apart from degenerative endplate irregularity. Soft tissues and spinal canal: No prevertebral fluid or swelling. No visible canal hematoma. Disc levels: Multilevel degenerative changes are present including disc space narrowing, endplate osteophytes, and facet and uncovertebral hypertrophy. Upper chest: Nodular scarring at the lung apices. There is 1.1 cm adjacent nodularity within the right upper lobe. Other: Calcified plaque at the common carotid bifurcations. IMPRESSION: No acute cervical spine fracture. 1.1 cm area of nodularity within the right upper lobe. May reflect scarring or neoplastic process. Follow-up as indicated given patient age. Electronically Signed   By: Macy Mis M.D.   On: 03/05/2020 10:47    ____________________________________________   PROCEDURES  Procedure(s) performed (including Critical  Care):  Procedures   ____________________________________________   INITIAL IMPRESSION / ASSESSMENT AND PLAN / ED COURSE  As part of my medical decision making, I reviewed the following data within the [place] notes reviewed and incorporated, Labs reviewed, EKG interpreted, Old chart reviewed, Radiograph reviewed and Notes from prior ED visits reviewed and incorporated        Care of this patient will be signed out to the oncoming physician at the end of my shift.  All pertinent patient information conveyed and all questions answered.  All further care and disposition decisions will be made by the oncoming physician.      ____________________________________________   FINAL CLINICAL IMPRESSION(S) / ED DIAGNOSES  Final diagnoses:  None     ED Discharge Orders    None       Note:  This document was prepared using Dragon voice recognition software and may include unintentional dictation errors.   Naaman Plummer, MD 03/05/20 1550

## 2020-03-05 NOTE — Progress Notes (Signed)
°  Called to evaluate patient for possible code STEMI.  Patient was seen earlier today in the emergency room following a fall.  Head CT did not reveal acute intracranial injury. The patient was discharged.  The patient has a history of dementia, and is a resident at the [facility].  The patient was taken via car by her son who had difficulty lifting her out of the car seat and decided to bring the patient back for further evaluation.  Patient did not complain of chest pain or shortness of breath.  ECG was performed which did reveal a 1 mm of ST elevation in lead V5 and V6 in the absence of chest pain.  The patient is oriented only to name.  Repeat labs were notable for elevated total CK 6320, high-sensitivity troponin 7220.  Lactic acid was 2.3 with a white count of 18,800.  Discussed the patient's clinical situation with her son who agrees that a conservative approach would be more appropriate and would defer code STEMI and emergent cardiac catheterization at this time light of patient's advanced age, dementia, lack of chest pain, and unknown duration of cardiac event.  I would recommend admission and appropriate cardiology consultation, initial therapy with aspirin and heparin, cycle cardiac isoenzymes, blood cultures and urinalysis, and 2D echocardiogram.

## 2020-03-05 NOTE — Progress Notes (Signed)
CODE SEPSIS - PHARMACY COMMUNICATION  **Broad Spectrum Antibiotics should be administered within 1 hour of Sepsis diagnosis**  Time Code Sepsis Called/Page Received: 1751  Antibiotics Ordered: Cefepime 2g IV q24 hours  Time of 1st antibiotic administration: 1836  Additional action taken by pharmacy: N/A  If necessary, Name of Provider/Nurse Contacted: [place], PharmD Pharmacy Resident  03/05/2020 6:04 PM

## 2020-03-05 NOTE — ED Provider Notes (Signed)
I assumed care of this patient approximately 1500.  Please see outgoing providers note for full details regarding initial assessment and evaluation.  In brief patient presented earlier today after an unwitnessed fall.  At that time her evaluation included CT head and C-spine that were unremarkable and patient was discharged back to her assisted living facility.  She then returned back to the ED this afternoon companied by her son who was concerned that she seems significantly weaker than baseline.  He states that while she has a history of dementia and is typically not oriented she is able to shuffle at baseline but today is no longer able to ambulate.  On my assessment patient is unable to write any history Oriented to name.  She denies any acute pain.  She has some mild edema in her left knee but otherwise moves all extremities spontaneously.  PERRLA.  EOMI.  I did review prior EKGs obtained earlier this afternoon prior to 1500 and there was some concerning findings for ischemia.  Labs obtained prior to signout at 1500 including a CBC and BMP were reviewed by myself.  CBC remarkable for a WBC count of 18.8 thousand.  BMP unremarkable.  To further assess for other etiologies for patient's weakness I did order troponin, hepatic function panel, chest x-ray, pelvic x-ray, D-dimer, magnesium, TSH, lactic acid, urine studies, blood cultures, urine culture, and procalcitonin.  No obvious foci of infection on exam and on my review of chest x-ray there is no evidence of acute traumatic injury or infectious process.  Pelvic x-ray is unremarkable my review.  Troponin did return elevated at greater than 7000.  I did immediately paged interventionalist cardiology who did come to see the patient at bedside and stated they did not believe a code STEMI should be called and patient should not be taken to the Cath Lab emergently.  They recommended medical management and cardiology consult with medicine admission.  They  also agreed with giving aspirin heparin which I have already ordered.  I did consult unassigned cardiology service who stated they would see the patient once patient was admitted.  Given elevated white blood cell count and lactic acid that was slightly elevated at 2.3 I made the patient code sepsis and ordered blood and urine cultures and empiric cefepime.  Also plan in and out the patient for urine studies as patient is unable to void spontaneously at this time.  D-dimer did return elevated slightly above 1000 and I did order a CTA chest to assess for evidence of PE.  I will plan to admit to the hospital service for further evaluation and management.  Today's Vitals   03/05/20 1238 03/05/20 1242 03/05/20 1453  BP:  (!) 149/72   Pulse:  (!) 109 82  Resp:  16   Temp:  98.2 F (36.8 C) 98.1 F (36.7 C)  TempSrc:  Oral Oral  SpO2:  94%   Weight: 48.1 kg    Height: 5\' 4"  (1.626 m)    PainSc: 0-No pain     Body mass index is 18.19 kg/m.   Marland KitchenCritical Care Performed by: Lucrezia Starch, MD Authorized by: Lucrezia Starch, MD   Critical care provider statement:    Critical care time (minutes):  45   Critical care was necessary to treat or prevent imminent or life-threatening deterioration of the following conditions:  Cardiac failure   Critical care was time spent personally by me on the following activities:  Discussions with consultants, evaluation of patient's response  to treatment, examination of patient, ordering and performing treatments and interventions, ordering and review of laboratory studies, ordering and review of radiographic studies, pulse oximetry, re-evaluation of patient's condition, obtaining history from patient or surrogate and review of old charts    Medications  aspirin EC tablet 325 mg (325 mg Oral Not Given 03/05/20 1726)  heparin ADULT infusion 100 units/mL (25000 units/246mL sodium chloride 0.45%) (550 Units/hr Intravenous New Bag/Given 03/05/20 1711)   lactated ringers bolus 1,000 mL (1,000 mLs Intravenous New Bag/Given 03/05/20 1654)  aspirin 81 MG chewable tablet (  Given 03/05/20 1655)  heparin bolus via infusion 2,900 Units (2,900 Units Intravenous Bolus from Bag 03/05/20 1659)        Lucrezia Starch, MD 03/05/20 1733

## 2020-03-06 ENCOUNTER — Inpatient Hospital Stay (HOSPITAL_COMMUNITY)
Admit: 2020-03-06 | Discharge: 2020-03-06 | Disposition: A | Discharge status: Home / Self Care | Payer: Medicare Other | Attending: Internal Medicine | Admitting: Internal Medicine

## 2020-03-06 DIAGNOSIS — R531 Weakness: Secondary | ICD-10-CM

## 2020-03-06 DIAGNOSIS — F0390 Unspecified dementia without behavioral disturbance: Secondary | ICD-10-CM | POA: Diagnosis not present

## 2020-03-06 DIAGNOSIS — W19XXXA Unspecified fall, initial encounter: Secondary | ICD-10-CM

## 2020-03-06 DIAGNOSIS — I361 Nonrheumatic tricuspid (valve) insufficiency: Secondary | ICD-10-CM | POA: Diagnosis not present

## 2020-03-06 DIAGNOSIS — I214 Non-ST elevation (NSTEMI) myocardial infarction: Secondary | ICD-10-CM | POA: Diagnosis not present

## 2020-03-06 DIAGNOSIS — I249 Acute ischemic heart disease, unspecified: Secondary | ICD-10-CM

## 2020-03-06 DIAGNOSIS — R748 Abnormal levels of other serum enzymes: Secondary | ICD-10-CM

## 2020-03-06 LAB — ECHOCARDIOGRAM COMPLETE
AR max vel: 2.69 cm2
AV Area VTI: 3.17 cm2
AV Area mean vel: 2.63 cm2
AV Mean grad: 21 mmHg
AV Peak grad: 32.9 mmHg
Ao pk vel: 2.87 m/s
Area-P 1/2: 3.99 cm2
Height: 64 in
S' Lateral: 2.42 cm
Weight: 1696 oz

## 2020-03-06 LAB — HEPARIN LEVEL (UNFRACTIONATED)
Heparin Unfractionated: 0.17 IU/mL — ABNORMAL LOW (ref 0.30–0.70)
Heparin Unfractionated: 0.29 IU/mL — ABNORMAL LOW (ref 0.30–0.70)

## 2020-03-06 LAB — CBC
HCT: 35.2 % — ABNORMAL LOW (ref 36.0–46.0)
Hemoglobin: 11.5 g/dL — ABNORMAL LOW (ref 12.0–15.0)
MCH: 30.9 pg (ref 26.0–34.0)
MCHC: 32.7 g/dL (ref 30.0–36.0)
MCV: 94.6 fL (ref 80.0–100.0)
Platelets: 237 10*3/uL (ref 150–400)
RBC: 3.72 MIL/uL — ABNORMAL LOW (ref 3.87–5.11)
RDW: 14.2 % (ref 11.5–15.5)
WBC: 14.7 10*3/uL — ABNORMAL HIGH (ref 4.0–10.5)
nRBC: 0 % (ref 0.0–0.2)

## 2020-03-06 LAB — GLUCOSE, CAPILLARY
Glucose-Capillary: 94 mg/dL (ref 70–99)
Glucose-Capillary: 86 mg/dL (ref 70–99)
Glucose-Capillary: 189 mg/dL — ABNORMAL HIGH (ref 70–99)
Glucose-Capillary: 72 mg/dL (ref 70–99)
Glucose-Capillary: 93 mg/dL (ref 70–99)
Glucose-Capillary: 99 mg/dL (ref 70–99)

## 2020-03-06 LAB — BASIC METABOLIC PANEL
Anion gap: 9 (ref 5–15)
BUN: 24 mg/dL — ABNORMAL HIGH (ref 8–23)
CO2: 25 mmol/L (ref 22–32)
Calcium: 8.8 mg/dL — ABNORMAL LOW (ref 8.9–10.3)
Chloride: 107 mmol/L (ref 98–111)
Creatinine, Ser: 0.76 mg/dL (ref 0.44–1.00)
GFR, Estimated: >60 mL/min (ref >60)
Glucose, Bld: 86 mg/dL (ref 70–99)
Potassium: 4.3 mmol/L (ref 3.5–5.1)
Sodium: 141 mmol/L (ref 135–145)

## 2020-03-06 LAB — SEDIMENTATION RATE: Sed Rate: 35 mm/hr — ABNORMAL HIGH (ref 0–30)

## 2020-03-06 LAB — CK: Total CK: 6444 U/L — ABNORMAL HIGH (ref 38–234)

## 2020-03-06 LAB — TROPONIN I (HIGH SENSITIVITY): Troponin I (High Sensitivity): 6903 ng/L (ref <18)

## 2020-03-06 LAB — CKMB (ARMC ONLY): CK, MB: 105.2 ng/mL — ABNORMAL HIGH (ref 0.5–5.0)

## 2020-03-06 MED ORDER — ACETAMINOPHEN 325 MG PO TABS
650.0000 mg | ORAL_TABLET | Freq: Four times a day (QID) | ORAL | Status: DC | PRN
  Administered 2020-03-06: 650 mg via ORAL
  Filled 2020-03-06: qty 2

## 2020-03-06 MED ORDER — ONDANSETRON HCL 4 MG PO TABS: 4.0000 mg | ORAL_TABLET | Freq: Four times a day (QID) | ORAL | Status: DC | PRN

## 2020-03-06 MED ORDER — HEPARIN BOLUS VIA INFUSION
1500.0000 [IU] | Freq: Once | INTRAVENOUS | Status: AC
  Administered 2020-03-06: 1500 [IU] via INTRAVENOUS
  Filled 2020-03-06: qty 1500

## 2020-03-06 MED ORDER — ACETAMINOPHEN 650 MG RE SUPP: 650.0000 mg | Freq: Four times a day (QID) | RECTAL | Status: DC | PRN

## 2020-03-06 MED ORDER — POLYETHYLENE GLYCOL 3350 17 G PO PACK: 17.0000 g | PACK | Freq: Every day | ORAL | Status: DC | PRN

## 2020-03-06 MED ORDER — SENNA 8.6 MG PO TABS
1.0000 | ORAL_TABLET | Freq: Two times a day (BID) | ORAL | Status: DC
  Administered 2020-03-06 – 2020-03-08 (×5): 8.6 mg via ORAL
  Filled 2020-03-06 (×5): qty 1

## 2020-03-06 MED ORDER — ONDANSETRON HCL 4 MG/2ML IJ SOLN: 4.0000 mg | Freq: Four times a day (QID) | INTRAMUSCULAR | Status: DC | PRN

## 2020-03-06 MED ORDER — SODIUM CHLORIDE 0.9 % IV SOLN: INTRAVENOUS | Status: DC

## 2020-03-06 NOTE — TOC Initial Note (Signed)
Transition of Care [facility]) - Initial/Assessment Note    Patient Details  Name: Aimee Kirk MRN: [id number] Date of Birth: [birth year]  Transition of Care [facility]) CM/SW Contact:    Ova Freshwater Phone Number: [phone] 03/06/2020, 2:39 PM  Clinical Narrative:                  Patient presents to [facility] ED from [facility] after fall. Patient had diagnosis of dementia and is unable to participate in assessment.  CSW contacted patient's Aimee Kirk, Aimee Kirk (Son) [phone] for collateral information. CSW explained role of TOC in patient care. Pt lives at [facility], she has limited assistance with ADLs, but is able to walk and eat on her own without assistance.  Pt does not have a history of behavioral issues or wandering.  Aimee Kirk was given Medicare.gov information for SNF choice. CSw explained estimated timeline and process for SNF placement if that was the recommendation. Aimee Kirk verbalized understanding.   Expected Discharge Plan: Skilled Nursing Facility Barriers to Discharge: Continued Medical Work up, ED SNF auth   Patient Goals and CMS Choice Patient states their goals for this hospitalization and ongoing recovery are:: Pt has dementia CMS Medicare.gov Compare Post Acute Care list provided to:: Other (Comment Required) (Polansky,Aimee Kirk (Son) [phone])    Expected Discharge Plan and Services Expected Discharge Plan: [place]                                              Prior Living Arrangements/Services                       Activities of Daily Living      Permission Sought/Granted                  Emotional Assessment              Admission diagnosis:  ACS (acute coronary syndrome) [facility]) [I24.9] Patient Active Problem List   Diagnosis Date Noted  . ACS (acute coronary syndrome) ([place]) 03/05/2020  . Sepsis without acute organ dysfunction ([place])   . Fall   . Positive D dimer   . Lactic  acidemia   . Dementia without behavioral disturbance [facility])    PCP:  [place]:   [place], [address] [place] [address] Phone: [phone] Fax: [phone]  [place], [place] - [address] [place] [address] Phone: [phone] Fax: [phone]     Social Determinants of Health (SDOH) Interventions    Readmission Risk Interventions No flowsheet data found.

## 2020-03-06 NOTE — Progress Notes (Signed)
[place] for Heparin infusion Indication: ACS/STEMI  Allergies  Allergen Reactions  . Remdesivir     Pt son states he absolutely does not want his mother to receive this medication     Patient Measurements: Height: 5\' 4"  (162.6 cm) Weight: 48.1 kg (106 lb) IBW/kg (Calculated) : 54.7  Vital Signs: BP: 130/63 (10/21 1903) Pulse Rate: 81 (10/21 1050)  Labs: Recent Labs    03/05/20 1245 03/05/20 1530 03/05/20 1530 03/05/20 1620 03/05/20 1825 03/05/20 2002 03/06/20 0304 03/06/20 0813 03/06/20 1909  HGB 13.8  --   --   --   --   --  11.5*  --   --   HCT 42.7  --   --   --   --   --  35.2*  --   --   PLT 284  --   --   --   --   --  237  --   --   APTT  --   --   --  30  --   --   --   --   --   LABPROT  --   --   --  12.6  --   --   --   --   --   INR  --   --   --  1.0  --   --   --   --   --   HEPARINUNFRC  --   --   --   --   --   --  0.17*  --  0.29*  CREATININE 0.91  --   --   --   --   --   --  0.76  --   CKTOTAL  --  6,320*  --   --   --   --  6,444*  --   --   CKMB  --   --   --   --   --   --   --  105.2*  --   TROPONINIHS  --  7,220*   < >  --  8,965* 7,062*  --  6,903*  --    < > = values in this interval not displayed.    Estimated Creatinine Clearance: 31.9 mL/min (by C-G formula based on SCr of 0.76 mg/dL).   Medical History: Past Medical History:  Diagnosis Date  . Thyroid disease     Medications:  Confirmed with patient's son that patient was not on any anticoagulation prior to admission  Assessment: 86yo female with PMH dementia who was seen earlier today after a fall resulting in head injury. Patient was brought back to ED for further eval by son who stated that she was "unable to stand and get in the car which is not normal for her". CT head showed no evidence of acute intracranial injury. 10/20 Troponin HS 7220, CK 6320; ECG Inferolateral injury pattern ACUTE MI / STEMI. Pharmacy has been consulted for  heparin dosing and monitoring.   Baseline CBC WNL; aPTT 30, PT 12.6, INR 1  10/21 HL @0306  - 0.17; subtherapeutic - bolus 1600 units and rate increased to 850 units/hr 10/21 HL @1909  - 0.29; original HL ordered for the afternoon was not drawn - nurse in ED was unaware she needed to draw level. Stat level was ordered.   Goal of Therapy:  Heparin level 0.3-0.7 units/ml Monitor platelets by anticoagulation protocol: Yes   Plan:  HL 0.29 - will not  bolus since HL was drawn ~14 hours after rate change and is just slightly subtherapeutic. Will increase rate to 850 units/hr Recheck HL 8 hrs after rate change Continue to monitor CBC per protocol  Sherilyn Banker, PharmD Pharmacy Resident  03/06/2020 7:39 PM

## 2020-03-06 NOTE — Evaluation (Signed)
Occupational Therapy Evaluation Patient Details Name: Aimee Kirk MRN: [id number] DOB: 1922-07-12 Today's Date: 03/06/2020    History of Present Illness 86 y.o. female PMH dementia admitted on 03/05/2020 with sepsis who was seen earlier today after a fall resulting in head injury. Patient was brought back to ED for further eval by son who stated that she was "unable to stand and get in the car which is not normal for her". Patient also being treated for ACS/STEMI. CT head showed no evidence of acute intracranial injur   Clinical Impression   Patient presenting with decreased I in self care, balance, functional mobility/tranfers, endurance, and safety awareness. Pt with cognitive deficits and only oriented to self this session. Pt appeared even more confused and resistive with therapist providing information about location, time, and situation. Pt also lethargic towards the end of session. Pt following 1 step commands 50% of the time.Pt required max - total A for bed mobility and resistive to coming to EOB. Per chart review, pt did not use AD for ambulation and needed min A for self care tasks.Patient will benefit from acute OT to increase overall independence in the areas of ADLs, functional mobility, and safety awareness in order to safely discharge to next venue of care.     Follow Up Recommendations  SNF    Equipment Recommendations  Other (comment) (defer to next venue of care)    Recommendations for Other Services Other (comment) (none at this time)     Precautions / Restrictions Precautions Precautions: Fall      Mobility Bed Mobility Overal bed mobility: Needs Assistance Bed Mobility: Rolling Rolling: Max assist         General bed mobility comments: Pt does reach for bed rail but requires max A to roll onto side.    Transfers         General transfer comment: no performed for pt and therapist safety        ADL either performed or assessed with clinical  judgement   ADL Overall ADL's : Needs assistance/impaired               General ADL Comments: Pt does wash face and hands with min cuing and set up A to obtain needed items. OT anticipates pt needing min A for UB self care and max - total A for LB self care.     Vision Baseline Vision/History: Wears glasses Wears Glasses: At all times Patient Visual Report: No change from baseline              Pertinent Vitals/Pain Pain Assessment: Faces Faces Pain Scale: Hurts even more Pain Location: generalized with rolling( Pt does not verbalize location) Pain Descriptors / Indicators: Discomfort;Guarding;Moaning Pain Intervention(s): Limited activity within patient's tolerance;Repositioned;Monitored during session     Hand Dominance Right   Extremity/Trunk Assessment Upper Extremity Assessment Upper Extremity Assessment: Generalized weakness   Lower Extremity Assessment Lower Extremity Assessment: Defer to PT evaluation   Cervical / Trunk Assessment Cervical / Trunk Assessment: Kyphotic   Communication Communication Communication: HOH   Cognition Arousal/Alertness: Lethargic Behavior During Therapy: Flat affect Overall Cognitive Status: History of cognitive impairments - at baseline            General Comments: Pt very hard of hearing. Pt oriented to self only this session. She was able to follow 50% of commands with increased time and mod multimodal cuing.              Home Living Family/patient expects  to be discharged to:: Skilled nursing facility            Prior Functioning/Environment Level of Independence: Needs assistance  Gait / Transfers Assistance Needed: Per chart review,Pt does not use AD for ambulation. ADL's / Homemaking Assistance Needed: Per chart review, pt requires min A for self care tasks            OT Problem List: Decreased strength;Pain;Decreased cognition;Decreased activity tolerance;Decreased safety awareness;Decreased  knowledge of use of DME or AE;Impaired balance (sitting and/or standing);Decreased knowledge of precautions      OT Treatment/Interventions: Self-care/ADL training;Therapeutic exercise;Therapeutic activities;Cognitive remediation/compensation;Energy conservation;Patient/family education;DME and/or AE instruction;Balance training    OT Goals(Current goals can be found in the care plan section) Acute Rehab OT Goals Patient Stated Goal: none stated OT Goal Formulation: Patient unable to participate in goal setting Time For Goal Achievement: 03/20/20 Potential to Achieve Goals: Fair ADL Goals Pt Will Perform Grooming: with supervision;sitting Pt Will Perform Toileting - Clothing Manipulation and hygiene: with supervision;sit to/from stand Pt Will Perform Tub/Shower Transfer: with supervision  OT Frequency: Min 1X/week   Barriers to D/C: Other (comment)  none at this time       Co-evaluation              AM-PAC OT "6 Clicks" Daily Activity     Outcome Measure Help from another person eating meals?: A Little Help from another person taking care of personal grooming?: A Little Help from another person toileting, which includes using toliet, bedpan, or urinal?: Total Help from another person bathing (including washing, rinsing, drying)?: Total Help from another person to put on and taking off regular upper body clothing?: A Lot Help from another person to put on and taking off regular lower body clothing?: Total 6 Click Score: 11   End of Session Nurse Communication: Mobility status;Precautions  Activity Tolerance: Patient limited by lethargy Patient left: in bed;with call bell/phone within reach  OT Visit Diagnosis: Muscle weakness (generalized) (M62.81);History of falling (Z91.81)                Time: 5093-2671 OT Time Calculation (min): 21 min Charges:  OT General Charges $OT Visit: 1 Visit OT Evaluation $OT Eval Moderate Complexity: 1 Mod OT Treatments $Self Care/Home  Management : 8-22 mins  Darleen Crocker, MS, OTR/L , CBIS ascom [phone]  03/06/20, 4:06 PM  03/06/2020, 3:03 PM

## 2020-03-06 NOTE — Progress Notes (Signed)
[facility] Room ED38 [facility]) Hospital Liaison RN note:  This patient is currently followed by out patient based Palliative care with [facility]. We will follow for disposition and update the team.  Loney Laurence [facility] Liaison [phone]

## 2020-03-06 NOTE — ED Notes (Signed)
CBG 96. 

## 2020-03-06 NOTE — Consult Note (Signed)
Cardiology Consultation:   Patient ID: Aimee Kirk; [id number]; 04-24-1923   Admit date: 03/05/2020 Date of Consult: 03/06/2020  Primary Care Provider: Orvis Brill, [facility] Primary Cardiologist: New to [facility], consult by [facility] Primary Electrophysiologist:  None   Patient Profile:   Aimee Kirk is a 86 y.o. female with a hx of dementia and thyroid disease who is being seen today for the evaluation of elevated troponin at the request of Dr. Posey Pronto.  History of Present Illness:   Aimee Kirk has no previously known cardiac history. She presented to the ED on 03/05/2020 after sustaining a fall and was found against the railing in the bathroom. Notes indicate this was a witnessed fall and while staff was getting the patient up, they heard a pop prompting them to contact EMS for transport to the ED. Of note, she recently came off hospice care. She has denied and chest pain or SOB.   Upon the patient's arrival to [facility] they were found to have stable vitals. Labs showed HS-Tn 7220-->8965-->9878, CK 6320-->6444, WBC 18.8-->14.7, blood culture no growth to date x 2, lactic acid 2.3-->1.4, D dimer 1187, PCT 0.60. CTA chest showed no evidence of PE, with biapical scarring and a nodular density in the right upper lobe measuring 11 mm, coronary artery calcification and aortic atherosclerosis. CT head without acute intracranial pathology. CT C-spine not acute outside of again noted nodular density within the right upper lobe. CXR not acute. EKG showed 1 mm st elevation in leads V5 and V6. She was evaluated by the STEMI MD and in the setting of her comorbid conditions including advanced age and underlying dementia, after discussion with her son, conservative management was decided upon. Currently, resting comfortably in bed. She is uncertain where she is and why she is here. She denies any chest pain or dyspnea.   Past Medical History:  Diagnosis Date  . Thyroid disease     Past  Surgical History:  Procedure Laterality Date  . ABDOMINAL HYSTERECTOMY       Home Meds: Prior to Admission medications   Not on File    Inpatient Medications: Scheduled Meds: . aspirin  81 mg Oral Daily  . aspirin EC  325 mg Oral Once  . atorvastatin  40 mg Oral Daily  . insulin aspart  0-5 Units Subcutaneous QHS  . insulin aspart  0-9 Units Subcutaneous TID WC  . metoprolol tartrate  25 mg Oral BID   Continuous Infusions: . ceFEPime (MAXIPIME) IV Stopped (03/05/20 1939)  . heparin 750 Units/hr (03/06/20 0506)   PRN Meds:   Allergies:   Allergies  Allergen Reactions  . Remdesivir     Pt son states he absolutely does not want his mother to receive this medication     Social History:   Social History   Socioeconomic History  . Marital status: Widowed    Spouse name: Not on file  . Number of children: Not on file  . Years of education: Not on file  . Highest education level: Not on file  Occupational History  . Not on file  Tobacco Use  . Smoking status: Never Smoker  . Smokeless tobacco: Never Used  Vaping Use  . Vaping Use: Never used  Substance and Sexual Activity  . Alcohol use: Never  . Drug use: Never  . Sexual activity: Not on file  Other Topics Concern  . Not on file  Social History Narrative  . Not on file   Social Determinants  of Health   Financial Resource Strain:   . Difficulty of Paying Living Expenses: Not on file  Food Insecurity:   . Worried About Charity fundraiser in the Last Year: Not on file  . Ran Out of Food in the Last Year: Not on file  Transportation Needs:   . Lack of Transportation (Medical): Not on file  . Lack of Transportation (Non-Medical): Not on file  Physical Activity:   . Days of Exercise per Week: Not on file  . Minutes of Exercise per Session: Not on file  Stress:   . Feeling of Stress : Not on file  Social Connections:   . Frequency of Communication with Friends and Family: Not on file  . Frequency of  Social Gatherings with Friends and Family: Not on file  . Attends Religious Services: Not on file  . Active Member of Clubs or Organizations: Not on file  . Attends Archivist Meetings: Not on file  . Marital Status: Not on file  Intimate Partner Violence:   . Fear of Current or Ex-Partner: Not on file  . Emotionally Abused: Not on file  . Physically Abused: Not on file  . Sexually Abused: Not on file     Family History:   Family History  Problem Relation Age of Onset  . Arthritis Mother   . Heart disease Father     ROS:  Review of Systems  Unable to perform ROS: Dementia      Physical Exam/Data:   Vitals:   03/06/20 0000 03/06/20 0200 03/06/20 0506 03/06/20 0600  BP:  127/73 (!) 137/59 121/60  Pulse:  80 72 70  Resp: 18 (!) 25 16 16   Temp:      TempSrc:      SpO2: 96%  97%   Weight:      Height:       No intake or output data in the 24 hours ending 03/06/20 0730 Filed Weights   03/05/20 1238  Weight: 48.1 kg   Body mass index is 18.19 kg/m.   Physical Exam: General: Elderly and frail appearing, in no acute distress. Head: Normocephalic, atraumatic, sclera non-icteric, no xanthomas, nares without discharge.  Neck: Negative for carotid bruits. JVD not elevated. Lungs: Clear bilaterally to auscultation without wheezes, rales, or rhonchi. Breathing is unlabored. Heart: RRR with S1 S2. II/VI systolic murmur RUSB, no rubs, or gallops appreciated. Abdomen: Soft, non-tender, non-distended with normoactive bowel sounds. No hepatomegaly. No rebound/guarding. No obvious abdominal masses. Msk:  Strength and tone appear normal for age. Extremities: No clubbing or cyanosis. No edema. Distal pedal pulses are 2+ and equal bilaterally. Neuro: Alert and oriented X 1 (person). No facial asymmetry. No focal deficit. Moves all extremities spontaneously. Psych:  Responds to questions appropriately with a normal affect.   EKG:  The EKG was personally reviewed and  demonstrates: sinus tachycardia, 107 bpm, possible anteroseptal infarct,1 mm inferolateral st elevation Telemetry:  Telemetry was personally reviewed and demonstrates: SR  Weights: [employer]   03/05/20 1238  Weight: 48.1 kg    Relevant CV Studies:  2D echo 12/2017: Normal LVEF, diastolic dysfunction, mild aortic stenosis, mild tricuspid regurgitation, mild pulmonary hypertension  __________  2D echo pending  Laboratory Data:  Chemistry Recent Labs  Lab 03/05/20 1245  NA 141  K 4.0  CL 105  CO2 24  GLUCOSE 144*  BUN 24*  CREATININE 0.91  CALCIUM 9.6  GFRNONAA 54*  ANIONGAP 12    Recent Labs  Lab  03/05/20 1245  PROT 7.5  ALBUMIN 4.3  AST 119*  ALT 39  ALKPHOS 83  BILITOT 0.9   Hematology Recent Labs  Lab 03/05/20 1245 03/06/20 0304  WBC 18.8* 14.7*  RBC 4.46 3.72*  HGB 13.8 11.5*  HCT 42.7 35.2*  MCV 95.7 94.6  MCH 30.9 30.9  MCHC 32.3 32.7  RDW 13.9 14.2  PLT 284 237   Cardiac EnzymesNo results for input(s): TROPONINI in the last 168 hours. No results for input(s): TROPIPOC in the last 168 hours.  BNPNo results for input(s): BNP, PROBNP in the last 168 hours.  DDimer No results for input(s): DDIMER in the last 168 hours.  Radiology/Studies:  DG Pelvis 1-2 Views  Result Date: 03/05/2020 IMPRESSION: No acute abnormality seen. Electronically Signed   By: Inez Catalina M.D.   On: 03/05/2020 15:26   DG Knee 2 Views Left  Result Date: 03/05/2020 IMPRESSION: 1. No evidence of an acute fracture. 2. Moderate to marked severity degenerative changes with a moderate sized joint effusion. Electronically Signed   By: Virgina Norfolk M.D.   On: 03/05/2020 17:42   CT Head Wo Contrast  Result Date: 03/05/2020 IMPRESSION: No evidence of acute intracranial injury. Electronically Signed   By: Macy Mis M.D.   On: 03/05/2020 10:40   CT Angio Chest PE W and/or Wo Contrast  Result Date: 03/05/2020 IMPRESSION: No evidence of pulmonary embolus.  Biapical scarring. Adjacent nodular density in the right upper lobe measures 11 mm, felt to be a component of the scarring. This could be followed with repeat CT in 3-6 months to ensure stability. Coronary artery disease. Aortic Atherosclerosis (ICD10-I70.0). Electronically Signed   By: Rolm Baptise M.D.   On: 03/05/2020 19:05   CT Cervical Spine Wo Contrast  Result Date: 03/05/2020 IMPRESSION: No acute cervical spine fracture. 1.1 cm area of nodularity within the right upper lobe. May reflect scarring or neoplastic process. Follow-up as indicated given patient age. Electronically Signed   By: Macy Mis M.D.   On: 03/05/2020 10:47   DG Chest Port 1 View  Result Date: 03/05/2020 IMPRESSION: No acute abnormality noted. Electronically Signed   By: Inez Catalina M.D.   On: 03/05/2020 15:27    Assessment and Plan:   1. NSTEMI: -Evaluated by STEMI MD overnight and in the absence of chest pain with significant underlying comorbid conditions including dementia and advanced age, medical therapy was recommended -She denies chest pain -Presentation in the context of a witnessed fall in the bathroom -Trend HS-Tn to peak -Add CK-MB -Heparin gtt for 48 hours -Echo -ASA -Lipitor -Lopressor  2. Fall with rhabdomyolysis: -Possibly contributing to her elevated troponin -Management per IM  3. Dementia: -Stable  4. Aortic stenosis: -Mild on prior echo -Echo pending -Not likely to be a candidate for invasive therapy as outlined above    For questions or updates, please contact [place] Please consult [email or web address] for contact info under Cardiology/STEMI.   Signed, Christell Faith, PA-C [place] Pager: [phone] 03/06/2020, 7:30 AM

## 2020-03-06 NOTE — Progress Notes (Signed)
[place] for Heparin infusion Indication: ACS/STEMI  Allergies  Allergen Reactions  . Remdesivir     Pt son states he absolutely does not want his mother to receive this medication     Patient Measurements: Height: 5\' 4"  (162.6 cm) Weight: 48.1 kg (106 lb) IBW/kg (Calculated) : 54.7  Vital Signs: Temp: 99 F (37.2 C) (10/20 1854) Temp Source: Oral (10/20 1854) BP: 127/73 (10/21 0200) Pulse Rate: 80 (10/21 0200)  Labs: Recent Labs    03/05/20 1245 03/05/20 1530 03/05/20 1620 03/05/20 1825 03/05/20 2002 03/06/20 0304  HGB 13.8  --   --   --   --  11.5*  HCT 42.7  --   --   --   --  35.2*  PLT 284  --   --   --   --  237  APTT  --   --  30  --   --   --   LABPROT  --   --  12.6  --   --   --   INR  --   --  1.0  --   --   --   HEPARINUNFRC  --   --   --   --   --  0.17*  CREATININE 0.91  --   --   --   --   --   CKTOTAL  --  6,320*  --   --   --   --   TROPONINIHS  --  7,220*  --  9,357* 0,177*  --     Estimated Creatinine Clearance: 28.1 mL/min (by C-G formula based on SCr of 0.91 mg/dL).   Medical History: Past Medical History:  Diagnosis Date  . Thyroid disease     Medications:  Confirmed with patient's son that patient was not on any anticoagulation prior to admission  Assessment: 86yo female with PMH dementia who was seen earlier today after a fall resulting in head injury. Patient was brought back to ED for further eval by son who stated that she was "unable to stand and get in the car which is not normal for her". CT head showed no evidence of acute intracranial injury. 10/20 Troponin HS 7220, CK 6320; ECG Inferolateral injury pattern ACUTE MI / STEMI. Pharmacy has been consulted for heparin dosing and monitoring.   Baseline CBC WNL; aPTT and PT-INR pending  Goal of Therapy:  Heparin level 0.3-0.7 units/ml Monitor platelets by anticoagulation protocol: Yes   Plan:  Give 2900 units bolus x 1 Start heparin  infusion at 550 units/hr Check anti-Xa level in 8 hours and daily while on heparin Continue to monitor H&H and platelets  Ordered aPTT and PT-INR  10/21: HL @ 0306 = 0.17 Will order heparin 750 units IV X 1 bolus and increase drip rate to 1600 units/hr. Will order HL 8 hrs after rate change.   Iley Breeden D, PharmD 03/06/2020 4:50 AM

## 2020-03-06 NOTE — Evaluation (Signed)
Physical Therapy Evaluation Patient Details Name: Aimee Kirk MRN: [id number] DOB: Apr 23, 1923 Today's Date: 03/06/2020   History of Present Illness  87 y.o. female PMH dementia admitted on 03/05/2020 with sepsis who was seen earlier today after a fall resulting in head injury. Patient was brought back to ED for further eval by son who stated that she was "unable to stand and get in the car which is not normal for her". Patient also being treated for ACS/STEMI. CT head showed no evidence of acute intracranial injury.    Clinical Impression  Pt alert, HOH, oriented to name only. Re-oriented to situation and place several times during session, pt displayed some anxiety and often verbalized "I don't know what to do" and "why am I here". Unable to provide PLOF information.  Upon assessment pt able to lift all extremities against gravity and perform a few supine LE exercises. Supine <> sit with minA and cueing to attend to task/sequencing. Pt exhibited posterior lean for majority of sitting balance, reliant on UE support. Sit <> stand with RW and minA, unable to tolerate standing with at least minA for greater than 10seconds due to fatigue and pt verbalized needing to go to the bathroom. Returned to supine due to safety concerns without two person assist and pt educated on purewick use.  Overall the patient demonstrated deficits (see "PT Problem List") that impede the patient's functional abilities, safety, and mobility and would benefit from skilled PT intervention. Recommendation is SNF due to current level of assistance needed pending pt progress.     Follow Up Recommendations SNF    Equipment Recommendations  Other (comment) (TBD at next venue of care)    Recommendations for Other Services       Precautions / Restrictions Precautions Precautions: Fall Precaution Comments: hep drip Restrictions Weight Bearing Restrictions: No      Mobility  Bed Mobility Overal bed mobility: Needs  Assistance Bed Mobility: Supine to Sit;Sit to Supine Rolling: Min assist   Supine to sit: Min assist     General bed mobility comments: reached for bed rail and PT hand to transfer to sitting EOB    Transfers Overall transfer level: Needs assistance Equipment used: Rolling walker (2 wheeled) Transfers: Sit to/from Stand Sit to Stand: Mod assist;From elevated surface         General transfer comment: unsteadiness noted, pt fatigued and requested to go to the bathroom, returned to supine due to lack of 2+ for safe transfer to commode. Pt oriented to use of purewik catheter  Ambulation/Gait             General Gait Details: deferred due to safety concerns  Stairs            Wheelchair Mobility    Modified Rankin (Stroke Patients Only)       Balance Overall balance assessment: Needs assistance Sitting-balance support: Bilateral upper extremity supported Sitting balance-Leahy Scale: Poor Sitting balance - Comments: reliant on UE support     Standing balance-Leahy Scale: Poor Standing balance comment: reliant on UE support                             Pertinent Vitals/Pain Pain Assessment: Faces Faces Pain Scale: Hurts a little bit Pain Location: with LLE movement, repositioning in bed Pain Descriptors / Indicators: Discomfort;Guarding;Moaning Pain Intervention(s): Limited activity within patient's tolerance;Monitored during session;Repositioned    Home Living Family/patient expects to be discharged to:: Skilled nursing facility  Additional Comments: Pt unable to provide PLOF. Per chart pt from an assisted living.    Prior Function Level of Independence: Needs assistance   Gait / Transfers Assistance Needed: Per chart review,Pt does not use AD for ambulation.  ADL's / Homemaking Assistance Needed: Per chart review, pt requires min A for self care tasks        Hand Dominance   Dominant Hand: Right     Extremity/Trunk Assessment   Upper Extremity Assessment Upper Extremity Assessment: Generalized weakness    Lower Extremity Assessment Lower Extremity Assessment: Generalized weakness (able to lift LEs against gravity)    Cervical / Trunk Assessment Cervical / Trunk Assessment: Kyphotic  Communication   Communication: HOH  Cognition Arousal/Alertness: Awake/alert Behavior During Therapy: Flat affect Overall Cognitive Status: History of cognitive impairments - at baseline                                 General Comments: Pt very hard of hearing. Pt oriented to self only this session. She was able to follow commands with extended time and multimodal cueing      General Comments      Exercises General Exercises - Lower Extremity Ankle Circles/Pumps: AROM;Both;5 reps Heel Slides: AAROM;Strengthening;Both;5 reps Hip ABduction/ADduction: AAROM;Strengthening;5 reps;Both   Assessment/Plan    PT Assessment Patient needs continued PT services  PT Problem List Decreased strength;Decreased mobility;Decreased safety awareness;Decreased activity tolerance;Decreased balance       PT Treatment Interventions DME instruction;Therapeutic exercise;Gait training;Balance training;Stair training;Neuromuscular re-education;Functional mobility training;Therapeutic activities;Patient/family education    PT Goals (Current goals can be found in the Care Plan section)  Acute Rehab PT Goals Patient Stated Goal: none stated PT Goal Formulation: With patient Time For Goal Achievement: 03/20/20 Potential to Achieve Goals: Good    Frequency Min 2X/week   Barriers to discharge Decreased caregiver support      Co-evaluation               AM-PAC PT "6 Clicks" Mobility  Outcome Measure Help needed turning from your back to your side while in a flat bed without using bedrails?: A Little Help needed moving from lying on your back to sitting on the side of a flat bed without  using bedrails?: A Little Help needed moving to and from a bed to a chair (including a wheelchair)?: A Little Help needed standing up from a chair using your arms (e.g., wheelchair or bedside chair)?: A Little Help needed to walk in hospital room?: A Lot Help needed climbing 3-5 steps with a railing? : Total 6 Click Score: 15    End of Session Equipment Utilized During Treatment: Gait belt Activity Tolerance: Patient limited by fatigue Patient left: in bed;with call bell/phone within reach Nurse Communication: Mobility status PT Visit Diagnosis: Other abnormalities of gait and mobility (R26.89);Difficulty in walking, not elsewhere classified (R26.2);Unsteadiness on feet (R26.81)    Time: 1007-1219 PT Time Calculation (min) (ACUTE ONLY): 27 min   Charges:   PT Evaluation $PT Eval Moderate Complexity: 1 Mod PT Treatments $Therapeutic Exercise: 8-22 mins        Lieutenant Diego PT, DPT 3:52 PM,03/06/20

## 2020-03-06 NOTE — Progress Notes (Signed)
*  PRELIMINARY RESULTS* Echocardiogram 2D Echocardiogram has been performed.  Sherrie Sport 03/06/2020, 9:48 AM

## 2020-03-06 NOTE — ED Notes (Signed)
Pt eating breakfast at this time.  

## 2020-03-06 NOTE — ED Notes (Signed)
Per Dr Posey Pronto, continue hepain drip and monitor vaginal bleeding.

## 2020-03-06 NOTE — ED Notes (Signed)
Pt c.o right hip pain, prn tylenol given, ice pack applied

## 2020-03-06 NOTE — Progress Notes (Addendum)
[place] [facility] [place] NAME: Aimee Kirk    MR#:  [id number]  DATE OF BIRTH:  1923-02-14  SUBJECTIVE:  patient resting comfortably in the ER. Earlier Therapist, sports noted some vaginally bleeding. No Emily in the ER  REVIEW OF SYSTEMS:   Review of Systems  Unable to perform ROS: Dementia   Tolerating Diet:yes Tolerating PT: SNF  DRUG ALLERGIES:   Allergies  Allergen Reactions  . Remdesivir     Pt son states he absolutely does not want his mother to receive this medication     VITALS:  Blood pressure (!) 103/59, pulse 81, temperature 99 F (37.2 C), temperature source Oral, resp. rate (!) 25, height 5\' 4"  (1.626 m), weight 48.1 kg, SpO2 100 %.  PHYSICAL EXAMINATION:   Physical Examlimited  GENERAL:  86 y.o.-year-old patient lying in the bed with no acute distress.  LUNGS: Normal breath sounds bilaterally, no wheezing, rales, rhonchi. No use of accessory muscles of respiration.  CARDIOVASCULAR: S1, S2 normal. No murmurs, rubs, or gallops.  ABDOMEN: Soft, nontender, nondistended. Bowel sounds present. No organomegaly or mass.  EXTREMITIES: No cyanosis, clubbing or edema b/l.    NEUROLOGIC: Cranial nerves II through XII are intact. No focal Motor or sensory deficits b/l. Moves all extremities spontaneously PSYCHIATRIC:  patient is alert and awake. Pleasant confusion at baseline. SKIN: No obvious rash, lesion, or ulcer.   LABORATORY PANEL:  CBC Recent Labs  Lab 03/06/20 0304  WBC 14.7*  HGB 11.5*  HCT 35.2*  PLT 237    Chemistries  Recent Labs  Lab 03/05/20 1245 03/05/20 1245 03/05/20 1530 03/06/20 0813  NA 141   < >  --  141  K 4.0   < >  --  4.3  CL 105   < >  --  107  CO2 24   < >  --  25  GLUCOSE 144*   < >  --  86  BUN 24*   < >  --  24*  CREATININE 0.91   < >  --  0.76  CALCIUM 9.6   < >  --  8.8*  MG  --   --  2.4  --   AST 119*  --   --   --   ALT 39  --   --   --   ALKPHOS 83  --   --   --   BILITOT 0.9  --   --    --    < > = values in this interval not displayed.   Cardiac Enzymes No results for input(s): TROPONINI in the last 168 hours. RADIOLOGY:  DG Pelvis 1-2 Views  Result Date: 03/05/2020 CLINICAL DATA:  Recent fall with pelvic pain, initial encounter EXAM: PELVIS - 1 VIEW COMPARISON:  None. FINDINGS: Pelvic ring is intact. Degenerative changes of the lumbar spine are noted. Mild degenerative changes of the hip joints are seen. No acute fracture is noted. IMPRESSION: No acute abnormality seen. Electronically Signed   By: Inez Catalina M.D.   On: 03/05/2020 15:26   DG Knee 2 Views Left  Result Date: 03/05/2020 CLINICAL DATA:  Status post fall. EXAM: LEFT KNEE - 1-2 VIEW COMPARISON:  None. FINDINGS: No evidence of an acute fracture or dislocation. Moderate to marked severity medial and lateral tibiofemoral compartment space narrowing is seen. There is a moderate sized joint effusion. IMPRESSION: 1. No evidence of an acute fracture. 2. Moderate to marked severity degenerative  changes with a moderate sized joint effusion. Electronically Signed   By: Virgina Norfolk M.D.   On: 03/05/2020 17:42   CT Head Wo Contrast  Result Date: 03/05/2020 CLINICAL DATA:  Un witnessed fall EXAM: CT HEAD WITHOUT CONTRAST TECHNIQUE: Contiguous axial images were obtained from the base of the skull through the vertex without intravenous contrast. COMPARISON:  None. FINDINGS: Brain: There is no acute intracranial hemorrhage, mass effect, or edema. Gray-white differentiation is preserved. There is no extra-axial fluid collection. Prominence of the ventricles and sulci reflects generalized parenchymal volume loss. Patchy hypoattenuation in the supratentorial white matter is nonspecific but may reflect mild chronic microvascular ischemic changes. Vascular: There is atherosclerotic calcification at the skull base. Skull: Calvarium is unremarkable. Sinuses/Orbits: No acute finding. Other: None. IMPRESSION: No evidence of acute  intracranial injury. Electronically Signed   By: Macy Mis M.D.   On: 03/05/2020 10:40   CT Angio Chest PE W and/or Wo Contrast  Result Date: 03/05/2020 CLINICAL DATA:  Elevated D-dimer.  Concern for PE.  Fall. EXAM: CT ANGIOGRAPHY CHEST WITH CONTRAST TECHNIQUE: Multidetector CT imaging of the chest was performed using the standard protocol during bolus administration of intravenous contrast. Multiplanar CT image reconstructions and MIPs were obtained to evaluate the vascular anatomy. CONTRAST:  40mL OMNIPAQUE IOHEXOL 350 MG/ML SOLN COMPARISON:  Chest x-ray today FINDINGS: Cardiovascular: No filling defects in the pulmonary arteries to suggest pulmonary emboli. Coronary artery and aortic calcifications. Dense mitral valve calcifications. Aorta is normal caliber. No aneurysm. Heart is normal size. Mediastinum/Nodes: No mediastinal, hilar, or axillary adenopathy. Trachea and esophagus are unremarkable. Thyroid unremarkable. Lungs/Pleura: Biapical scarring. Nodular density in the right upper lobe measures up to 11 mm, felt to be related to the adjacent scarring. No confluent opacities or effusions. Upper Abdomen: Imaging into the upper abdomen demonstrates no acute findings. Musculoskeletal: Chest wall soft tissues are unremarkable. No acute bony abnormality. Review of the MIP images confirms the above findings. IMPRESSION: No evidence of pulmonary embolus. Biapical scarring. Adjacent nodular density in the right upper lobe measures 11 mm, felt to be a component of the scarring. This could be followed with repeat CT in 3-6 months to ensure stability. Coronary artery disease. Aortic Atherosclerosis (ICD10-I70.0). Electronically Signed   By: Rolm Baptise M.D.   On: 03/05/2020 19:05   CT Cervical Spine Wo Contrast  Result Date: 03/05/2020 CLINICAL DATA:  Fall EXAM: CT CERVICAL SPINE WITHOUT CONTRAST TECHNIQUE: Multidetector CT imaging of the cervical spine was performed without intravenous contrast.  Multiplanar CT image reconstructions were also generated. COMPARISON:  None. FINDINGS: Alignment: Minor degenerative listhesis. Skull base and vertebrae: No acute cervical spine fracture. Vertebral body heights are maintained apart from degenerative endplate irregularity. Soft tissues and spinal canal: No prevertebral fluid or swelling. No visible canal hematoma. Disc levels: Multilevel degenerative changes are present including disc space narrowing, endplate osteophytes, and facet and uncovertebral hypertrophy. Upper chest: Nodular scarring at the lung apices. There is 1.1 cm adjacent nodularity within the right upper lobe. Other: Calcified plaque at the common carotid bifurcations. IMPRESSION: No acute cervical spine fracture. 1.1 cm area of nodularity within the right upper lobe. May reflect scarring or neoplastic process. Follow-up as indicated given patient age. Electronically Signed   By: Macy Mis M.D.   On: 03/05/2020 10:47   DG Chest Port 1 View  Result Date: 03/05/2020 CLINICAL DATA:  Recent fall with chest pain, initial encounter EXAM: PORTABLE CHEST 1 VIEW COMPARISON:  None. FINDINGS: Cardiac shadow is mildly enlarged.  Aortic calcifications are noted. Lungs are well aerated bilaterally. No infiltrate, effusion or pneumothorax is seen. No acute bony abnormality noted. Degenerative changes about shoulder joints are seen. IMPRESSION: No acute abnormality noted. Electronically Signed   By: Inez Catalina M.D.   On: 03/05/2020 15:27   ECHOCARDIOGRAM COMPLETE  Result Date: 03/06/2020    ECHOCARDIOGRAM REPORT   Patient Name:   Aimee Kirk [facility], [place] Date of Exam: 03/06/2020 Medical Rec #:  [id number]        Height:       64.0 in Accession #:    [id number]       Weight:       106.0 lb Date of Birth:  1922-11-08         BSA:          1.494 m Patient Age:    89 years         BP:           129/71 mmHg Patient Gender: F                HR:           78 bpm. Exam Location:  [facility] Procedure: 2D Echo, Color Doppler  and Cardiac Doppler Indications:     Acute myocardial infarction 410  History:         Patient has prior history of Echocardiogram examinations, most                  recent 01/17/2018. No cardiac history listed in chart.  Sonographer:     Sherrie Sport RDCS (AE) Referring Phys:  [place] Diagnosing Phys: Ida Rogue MD IMPRESSIONS  1. Left ventricular ejection fraction, by estimation, is 50 to 55%. The left ventricle has low normal function. The left ventricle demonstrates regional wall motion abnormalities (distal anterior and anteroseptal wall and apical region). There is mild left ventricular hypertrophy. Left ventricular diastolic parameters are consistent with Grade I diastolic dysfunction (impaired relaxation).  2. Right ventricular systolic function is normal. The right ventricular size is normal. There is mildly elevated pulmonary artery systolic pressure. The estimated right ventricular systolic pressure is 10.2 mmHg.  3. Left atrial size was mild to moderately dilated.  4. Moderate mitral valve regurgitation.  5. Tricuspid valve regurgitation is moderate to severe.  6. The aortic valve is heavily calcified. Not well visualized. Measurements obtained suggest mild to moderate stenosis. Highest aortic valve mean gradient measures 21.0 mmHg suggesting moderate AS, other measurements suggesting mild AS. FINDINGS  Left Ventricle: Left ventricular ejection fraction, by estimation, is 50 to 55%. The left ventricle has low normal function. The left ventricle demonstrates regional wall motion abnormalities. The left ventricular internal cavity size was normal in size. There is mild left ventricular hypertrophy. Left ventricular diastolic parameters are consistent with Grade I diastolic dysfunction (impaired relaxation). Right Ventricle: The right ventricular size is normal. No increase in right ventricular wall thickness. Right ventricular systolic function is normal. There is mildly elevated pulmonary artery  systolic pressure. The tricuspid regurgitant velocity is 3.09  m/s, and with an assumed right atrial pressure of 5 mmHg, the estimated right ventricular systolic pressure is 58.5 mmHg. Left Atrium: Left atrial size was mild to moderately dilated. Right Atrium: Right atrial size was normal in size. Pericardium: There is no evidence of pericardial effusion. Mitral Valve: The mitral valve is normal in structure. Moderate mitral valve regurgitation. No evidence of mitral valve stenosis. Tricuspid Valve: The tricuspid valve is normal in structure.  Tricuspid valve regurgitation is moderate to severe. No evidence of tricuspid stenosis. Aortic Valve: The aortic valve is normal in structure. Aortic valve regurgitation is not visualized. Mild to moderate aortic valve sclerosis/calcification is present, without any evidence of aortic stenosis. Aortic valve mean gradient measures 21.0 mmHg.  Aortic valve peak gradient measures 32.9 mmHg. Aortic valve area, by VTI measures 3.17 cm. Pulmonic Valve: The pulmonic valve was normal in structure. Pulmonic valve regurgitation is not visualized. No evidence of pulmonic stenosis. Aorta: The aortic root is normal in size and structure. Venous: The inferior vena cava is normal in size with greater than 50% respiratory variability, suggesting right atrial pressure of 3 mmHg. IAS/Shunts: No atrial level shunt detected by color flow Doppler.  LEFT VENTRICLE PLAX 2D LVIDd:         3.95 cm  Diastology LVIDs:         2.42 cm  LV e' medial:    4.24 cm/s LV PW:         1.20 cm  LV E/e' medial:  18.8 LV IVS:        1.06 cm  LV e' lateral:   3.92 cm/s LVOT diam:     2.00 cm  LV E/e' lateral: 20.3 LV SV:         174 LV SV Index:   117 LVOT Area:     3.14 cm  RIGHT VENTRICLE RV Basal diam:  2.75 cm RV S prime:     13.80 cm/s TAPSE (M-mode): 3.3 cm LEFT ATRIUM             Index       RIGHT ATRIUM           Index LA diam:        3.40 cm 2.28 cm/m  RA Area:     20.30 cm LA Vol (A2C):   83.7 ml 56.03  ml/m RA Volume:   61.70 ml  41.31 ml/m LA Vol (A4C):   63.4 ml 42.44 ml/m LA Biplane Vol: 73.9 ml 49.47 ml/m  AORTIC VALVE                    PULMONIC VALVE AV Area (Vmax):    2.69 cm     PV Vmax:        0.75 m/s AV Area (Vmean):   2.63 cm     PV Peak grad:   2.2 mmHg AV Area (VTI):     3.17 cm     RVOT Peak grad: 5 mmHg AV Vmax:           287.00 cm/s AV Vmean:          173.333 cm/s AV VTI:            0.549 m AV Peak Grad:      32.9 mmHg AV Mean Grad:      21.0 mmHg LVOT Vmax:         246.00 cm/s LVOT Vmean:        145.000 cm/s LVOT VTI:          0.554 m LVOT/AV VTI ratio: 1.01  AORTA Ao Root diam: 2.60 cm MITRAL VALVE                TRICUSPID VALVE MV Area (PHT): 3.99 cm     TR Peak grad:   38.2 mmHg MV Decel Time: 190 msec     TR Vmax:        309.00 cm/s MV  E velocity: 79.50 cm/s MV A velocity: 123.00 cm/s  SHUNTS MV E/A ratio:  0.65         Systemic VTI:  0.55 m                             Systemic Diam: 2.00 cm Ida Rogue MD Electronically signed by Ida Rogue MD Signature Date/Time: 03/06/2020/12:39:03 PM    Final    ASSESSMENT AND PLAN:  Aimee Kirk  is a 86 y.o. female with a known history of dementia who is a resident at home place comes in after she had a fall at her assisted living. Patient was evaluated in the emergency room her imaging studies were negative. She was discharged to go back to home place this morning. Patient son was not able to get her out of the car and stand up when he took her back. He brought her back to the emergency room for further evaluation  Acute coronary syndrome/NSTEMI -- patient came in after a fall and weakness. Noted to have elevated troponin of 7200 --Continue heparin drip, aspirin, low-dose beta-blocker and statin -- appreciate [facility] cardiology Dr. Donivan Scull input. --echo of the heart shows EF of 50 to 55%, hypokinesis distal anterior and intercept all region, moderate to severe TR. -- Continue medical management per family request patient is a  poor candidate for cardiac cath given agent dementia  Vaginally bleeding-- reported by RN -mild -no further episode, etiology unclear -hemoglobin stable  SIRS with ACS Sepsis ruled out at present -etiology unknown  -remains afebrile. UA not impressive. Leukocyte Estrase and nitrite negative, wbc 6-10 -workup still pending. -Blood cultures negative. -Urine culture pending -chest x-ray negative for pneumonia -elevated white count of 18-- 14 K, tachycardia--reolved, lactic acid more than 2.0-- down to 1.4 -received empiric IV antibiotic with cefepime-- will hold antibiotic for now -Pro calcitonin 0.6  Hyperglycemia -check A1c -could be stress related -sliding scale insulin  History of dementia without behavior change -supportive care  Elevated fibrin derivative -CT chest no PE. No evidence of pneumonia.  Fall with mild rhabdomyolysis -IV fluids -follow CPK -CT head and cervical spine-- no trauma    Family Communication : son in the emergency room on 10/21 Consults : [facility] cardiology Code Status : DNR prior to arrival DVT prophylaxis : heparin drip admission status: inpatient patient came in with fall found to have acute MI/ACS and sepsis of unknown etiology. She will at least require two midnight stay in the hospital.  Procedures: none    Dispo: The patient is from: ALF              Anticipated d/c is to: TBD              Anticipated d/c date is: 2 days              Patient currently is not medically stable to d/c.        TOTAL TIME TAKING CARE OF THIS PATIENT: 25 minutes.  >50% time spent on counselling and coordination of care  Note: This dictation was prepared with Dragon dictation along with smaller phrase technology. Any transcriptional errors that result from this process are unintentional.  Fritzi Mandes M.D    [facility]   CC: Primary care physician; [facility], Doctors MakingPatient ID: Aimee Kirk, female   DOB: August 12, 1922, 86  y.o.   MRN: [id number]

## 2020-03-06 NOTE — ED Notes (Signed)
This RN to bedside to clean pt and place purewick. Fresh blood noted to vaginal area.  Dr Posey Pronto notified via secure chat. Awaiting response. No active bleeding at this time

## 2020-03-07 DIAGNOSIS — Y92009 Unspecified place in unspecified non-institutional (private) residence as the place of occurrence of the external cause: Secondary | ICD-10-CM

## 2020-03-07 DIAGNOSIS — G309 Alzheimer's disease, unspecified: Secondary | ICD-10-CM

## 2020-03-07 DIAGNOSIS — F0280 Dementia in other diseases classified elsewhere without behavioral disturbance: Secondary | ICD-10-CM

## 2020-03-07 DIAGNOSIS — T796XXA Traumatic ischemia of muscle, initial encounter: Secondary | ICD-10-CM

## 2020-03-07 DIAGNOSIS — I214 Non-ST elevation (NSTEMI) myocardial infarction: Principal | ICD-10-CM

## 2020-03-07 LAB — CK: Total CK: 2826 U/L — ABNORMAL HIGH (ref 38–234)

## 2020-03-07 LAB — CBC
HCT: 31.8 % — ABNORMAL LOW (ref 36.0–46.0)
Hemoglobin: 10.3 g/dL — ABNORMAL LOW (ref 12.0–15.0)
MCH: 31.2 pg (ref 26.0–34.0)
MCHC: 32.4 g/dL (ref 30.0–36.0)
MCV: 96.4 fL (ref 80.0–100.0)
Platelets: 248 10*3/uL (ref 150–400)
RBC: 3.3 MIL/uL — ABNORMAL LOW (ref 3.87–5.11)
RDW: 14.6 % (ref 11.5–15.5)
WBC: 12.1 10*3/uL — ABNORMAL HIGH (ref 4.0–10.5)
nRBC: 0 % (ref 0.0–0.2)

## 2020-03-07 LAB — URINE CULTURE: Culture: NO GROWTH

## 2020-03-07 LAB — GLUCOSE, CAPILLARY
Glucose-Capillary: 84 mg/dL (ref 70–99)
Glucose-Capillary: 171 mg/dL — ABNORMAL HIGH (ref 70–99)
Glucose-Capillary: 119 mg/dL — ABNORMAL HIGH (ref 70–99)
Glucose-Capillary: 60 mg/dL — ABNORMAL LOW (ref 70–99)
Glucose-Capillary: 118 mg/dL — ABNORMAL HIGH (ref 70–99)

## 2020-03-07 LAB — HEMOGLOBIN A1C
Hgb A1c MFr Bld: 5.4 % (ref 4.8–5.6)
Mean Plasma Glucose: 108 mg/dL

## 2020-03-07 LAB — HEPARIN LEVEL (UNFRACTIONATED)
Heparin Unfractionated: 0.43 IU/mL (ref 0.30–0.70)
Heparin Unfractionated: 0.39 IU/mL (ref 0.30–0.70)

## 2020-03-07 LAB — TROPONIN I (HIGH SENSITIVITY): Troponin I (High Sensitivity): 1926 ng/L (ref <18)

## 2020-03-07 MED ORDER — METOPROLOL TARTRATE 50 MG PO TABS
50.0000 mg | ORAL_TABLET | Freq: Two times a day (BID) | ORAL | Status: DC
  Administered 2020-03-07 – 2020-03-08 (×3): 50 mg via ORAL
  Filled 2020-03-07 (×3): qty 1

## 2020-03-07 NOTE — Progress Notes (Signed)
   03/07/20 0857  Assess: MEWS Score  Temp 98.6 F (37 C)  BP (!) 155/80  Pulse Rate (!) 102  ECG Heart Rate (!) 116  Resp 18  Level of Consciousness Alert  SpO2 98 %  O2 Device Room Air  Assess: MEWS Score  MEWS Temp 0  MEWS Systolic 0  MEWS Pulse 2  MEWS RR 0  MEWS LOC 0  MEWS Score 2  MEWS Score Color Yellow  Assess: if the MEWS score is Yellow or Red  Were vital signs taken at a resting state? Yes  Focused Assessment No change from prior assessment  Early Detection of Sepsis Score *See Row Information* Low  MEWS guidelines implemented *See Row Information* Yes  Treat  MEWS Interventions Administered scheduled meds/treatments  Pain Scale 0-10  Pain Score 0  Take Vital Signs  Increase Vital Sign Frequency  Yellow: Q 2hr X 2 then Q 4hr X 2, if remains yellow, continue Q 4hrs  Notify: Charge Nurse/RN  Name of Charge Nurse/RN Notified Tammy, RN  Date Charge Nurse/RN Notified 03/07/20  Time Charge Nurse/RN Notified [id number]  Notify: Provider  Provider Name/Title Dr. Fritzi Mandes  Date Provider Notified 03/07/20  Time Provider Notified [phone]  Notification Type Page  Notification Reason Change in status  Response Other (Comment) (no new orders at this time)  Date of Provider Response 03/07/20  Time of Provider Response [phone]  Document  Patient Outcome Stabilized after interventions  Progress note created (see row info) Yes

## 2020-03-07 NOTE — Care Management Important Message (Signed)
Important Message  Patient Details  Name: Aimee Kirk MRN: [id number] Date of Birth: 01-04-1923   Medicare Important Message Given:  Yes     Dannette Barbara 03/07/2020, 12:19 PM

## 2020-03-07 NOTE — Progress Notes (Signed)
[facility] [place] NAME: Aimee Kirk    MR#:  [id number]  DATE OF BIRTH:  1923/04/23  SUBJECTIVE:  patient resting comfortably. No issues per RN. Son in the room. Overall hemodynamically stable. Tolerating PO diet.  REVIEW OF SYSTEMS:   Review of Systems  Unable to perform ROS: Dementia   Tolerating Diet:yes Tolerating PT: SNF-- patient will return back to home place  DRUG ALLERGIES:   Allergies  Allergen Reactions  . Remdesivir     Pt son states he absolutely does not want his mother to receive this medication     VITALS:  Blood pressure 112/61, pulse 79, temperature 98.1 F (36.7 C), temperature source Oral, resp. rate 18, height 5\' 4"  (1.626 m), weight 49.9 kg, SpO2 98 %.  PHYSICAL EXAMINATION:   Physical Examlimited  GENERAL:  86 y.o.-year-old patient lying in the bed with no acute distress.  LUNGS: Normal breath sounds bilaterally, no wheezing, rales, rhonchi. No use of accessory muscles of respiration.  CARDIOVASCULAR: S1, S2 normal. No murmurs, rubs, or gallops.  ABDOMEN: Soft, nontender, nondistended. Bowel sounds present. No organomegaly or mass.  EXTREMITIES: No cyanosis, clubbing or edema b/l.    NEUROLOGIC: Cranial nerves II through XII are intact. No focal Motor or sensory deficits b/l. Moves all extremities spontaneously PSYCHIATRIC:  patient is alert and awake. Pleasant confusion at baseline. SKIN: No obvious rash, lesion, or ulcer.   LABORATORY PANEL:  CBC Recent Labs  Lab 03/07/20 0520  WBC 12.1*  HGB 10.3*  HCT 31.8*  PLT 248    Chemistries  Recent Labs  Lab 03/05/20 1245 03/05/20 1245 03/05/20 1530 03/06/20 0813  NA 141   < >  --  141  K 4.0   < >  --  4.3  CL 105   < >  --  107  CO2 24   < >  --  25  GLUCOSE 144*   < >  --  86  BUN 24*   < >  --  24*  CREATININE 0.91   < >  --  0.76  CALCIUM 9.6   < >  --  8.8*  MG  --   --  2.4  --   AST 119*  --   --   --   ALT 39  --   --   --    ALKPHOS 83  --   --   --   BILITOT 0.9  --   --   --    < > = values in this interval not displayed.   Cardiac Enzymes No results for input(s): TROPONINI in the last 168 hours. RADIOLOGY:  DG Pelvis 1-2 Views  Result Date: 03/05/2020 CLINICAL DATA:  Recent fall with pelvic pain, initial encounter EXAM: PELVIS - 1 VIEW COMPARISON:  None. FINDINGS: Pelvic ring is intact. Degenerative changes of the lumbar spine are noted. Mild degenerative changes of the hip joints are seen. No acute fracture is noted. IMPRESSION: No acute abnormality seen. Electronically Signed   By: Inez Catalina M.D.   On: 03/05/2020 15:26   DG Knee 2 Views Left  Result Date: 03/05/2020 CLINICAL DATA:  Status post fall. EXAM: LEFT KNEE - 1-2 VIEW COMPARISON:  None. FINDINGS: No evidence of an acute fracture or dislocation. Moderate to marked severity medial and lateral tibiofemoral compartment space narrowing is seen. There is a moderate sized joint effusion. IMPRESSION: 1. No evidence of an acute fracture. 2.  Moderate to marked severity degenerative changes with a moderate sized joint effusion. Electronically Signed   By: Virgina Norfolk M.D.   On: 03/05/2020 17:42   CT Angio Chest PE W and/or Wo Contrast  Result Date: 03/05/2020 CLINICAL DATA:  Elevated D-dimer.  Concern for PE.  Fall. EXAM: CT ANGIOGRAPHY CHEST WITH CONTRAST TECHNIQUE: Multidetector CT imaging of the chest was performed using the standard protocol during bolus administration of intravenous contrast. Multiplanar CT image reconstructions and MIPs were obtained to evaluate the vascular anatomy. CONTRAST:  52mL OMNIPAQUE IOHEXOL 350 MG/ML SOLN COMPARISON:  Chest x-ray today FINDINGS: Cardiovascular: No filling defects in the pulmonary arteries to suggest pulmonary emboli. Coronary artery and aortic calcifications. Dense mitral valve calcifications. Aorta is normal caliber. No aneurysm. Heart is normal size. Mediastinum/Nodes: No mediastinal, hilar, or axillary  adenopathy. Trachea and esophagus are unremarkable. Thyroid unremarkable. Lungs/Pleura: Biapical scarring. Nodular density in the right upper lobe measures up to 11 mm, felt to be related to the adjacent scarring. No confluent opacities or effusions. Upper Abdomen: Imaging into the upper abdomen demonstrates no acute findings. Musculoskeletal: Chest wall soft tissues are unremarkable. No acute bony abnormality. Review of the MIP images confirms the above findings. IMPRESSION: No evidence of pulmonary embolus. Biapical scarring. Adjacent nodular density in the right upper lobe measures 11 mm, felt to be a component of the scarring. This could be followed with repeat CT in 3-6 months to ensure stability. Coronary artery disease. Aortic Atherosclerosis (ICD10-I70.0). Electronically Signed   By: Rolm Baptise M.D.   On: 03/05/2020 19:05   DG Chest Port 1 View  Result Date: 03/05/2020 CLINICAL DATA:  Recent fall with chest pain, initial encounter EXAM: PORTABLE CHEST 1 VIEW COMPARISON:  None. FINDINGS: Cardiac shadow is mildly enlarged. Aortic calcifications are noted. Lungs are well aerated bilaterally. No infiltrate, effusion or pneumothorax is seen. No acute bony abnormality noted. Degenerative changes about shoulder joints are seen. IMPRESSION: No acute abnormality noted. Electronically Signed   By: Inez Catalina M.D.   On: 03/05/2020 15:27   ECHOCARDIOGRAM COMPLETE  Result Date: 03/06/2020    ECHOCARDIOGRAM REPORT   Patient Name:   Aimee Kirk [facility] Date of Exam: 03/06/2020 Medical Rec #:  [id number]        Height:       64.0 in Accession #:    [id number]       Weight:       106.0 lb Date of Birth:  10-22-1922         BSA:          1.494 m Patient Age:    86 years         BP:           129/71 mmHg Patient Gender: F                HR:           78 bpm. Exam Location:  [facility] Procedure: 2D Echo, Color Doppler and Cardiac Doppler Indications:     Acute myocardial infarction 410  History:         Patient has prior  history of Echocardiogram examinations, most                  recent 01/17/2018. No cardiac history listed in chart.  Sonographer:     Sherrie Sport RDCS (AE) Referring Phys:  [place] Diagnosing Phys: Ida Rogue MD IMPRESSIONS  1. Left ventricular ejection fraction, by estimation, is 50 to  55%. The left ventricle has low normal function. The left ventricle demonstrates regional wall motion abnormalities (distal anterior and anteroseptal wall and apical region). There is mild left ventricular hypertrophy. Left ventricular diastolic parameters are consistent with Grade I diastolic dysfunction (impaired relaxation).  2. Right ventricular systolic function is normal. The right ventricular size is normal. There is mildly elevated pulmonary artery systolic pressure. The estimated right ventricular systolic pressure is 40.9 mmHg.  3. Left atrial size was mild to moderately dilated.  4. Moderate mitral valve regurgitation.  5. Tricuspid valve regurgitation is moderate to severe.  6. The aortic valve is heavily calcified. Not well visualized. Measurements obtained suggest mild to moderate stenosis. Highest aortic valve mean gradient measures 21.0 mmHg suggesting moderate AS, other measurements suggesting mild AS. FINDINGS  Left Ventricle: Left ventricular ejection fraction, by estimation, is 50 to 55%. The left ventricle has low normal function. The left ventricle demonstrates regional wall motion abnormalities. The left ventricular internal cavity size was normal in size. There is mild left ventricular hypertrophy. Left ventricular diastolic parameters are consistent with Grade I diastolic dysfunction (impaired relaxation). Right Ventricle: The right ventricular size is normal. No increase in right ventricular wall thickness. Right ventricular systolic function is normal. There is mildly elevated pulmonary artery systolic pressure. The tricuspid regurgitant velocity is 3.09  m/s, and with an assumed right atrial  pressure of 5 mmHg, the estimated right ventricular systolic pressure is 81.1 mmHg. Left Atrium: Left atrial size was mild to moderately dilated. Right Atrium: Right atrial size was normal in size. Pericardium: There is no evidence of pericardial effusion. Mitral Valve: The mitral valve is normal in structure. Moderate mitral valve regurgitation. No evidence of mitral valve stenosis. Tricuspid Valve: The tricuspid valve is normal in structure. Tricuspid valve regurgitation is moderate to severe. No evidence of tricuspid stenosis. Aortic Valve: The aortic valve is normal in structure. Aortic valve regurgitation is not visualized. Mild to moderate aortic valve sclerosis/calcification is present, without any evidence of aortic stenosis. Aortic valve mean gradient measures 21.0 mmHg.  Aortic valve peak gradient measures 32.9 mmHg. Aortic valve area, by VTI measures 3.17 cm. Pulmonic Valve: The pulmonic valve was normal in structure. Pulmonic valve regurgitation is not visualized. No evidence of pulmonic stenosis. Aorta: The aortic root is normal in size and structure. Venous: The inferior vena cava is normal in size with greater than 50% respiratory variability, suggesting right atrial pressure of 3 mmHg. IAS/Shunts: No atrial level shunt detected by color flow Doppler.  LEFT VENTRICLE PLAX 2D LVIDd:         3.95 cm  Diastology LVIDs:         2.42 cm  LV e' medial:    4.24 cm/s LV PW:         1.20 cm  LV E/e' medial:  18.8 LV IVS:        1.06 cm  LV e' lateral:   3.92 cm/s LVOT diam:     2.00 cm  LV E/e' lateral: 20.3 LV SV:         174 LV SV Index:   117 LVOT Area:     3.14 cm  RIGHT VENTRICLE RV Basal diam:  2.75 cm RV S prime:     13.80 cm/s TAPSE (M-mode): 3.3 cm LEFT ATRIUM             Index       RIGHT ATRIUM           Index LA diam:  3.40 cm 2.28 cm/m  RA Area:     20.30 cm LA Vol (A2C):   83.7 ml 56.03 ml/m RA Volume:   61.70 ml  41.31 ml/m LA Vol (A4C):   63.4 ml 42.44 ml/m LA Biplane Vol: 73.9 ml  49.47 ml/m  AORTIC VALVE                    PULMONIC VALVE AV Area (Vmax):    2.69 cm     PV Vmax:        0.75 m/s AV Area (Vmean):   2.63 cm     PV Peak grad:   2.2 mmHg AV Area (VTI):     3.17 cm     RVOT Peak grad: 5 mmHg AV Vmax:           287.00 cm/s AV Vmean:          173.333 cm/s AV VTI:            0.549 m AV Peak Grad:      32.9 mmHg AV Mean Grad:      21.0 mmHg LVOT Vmax:         246.00 cm/s LVOT Vmean:        145.000 cm/s LVOT VTI:          0.554 m LVOT/AV VTI ratio: 1.01  AORTA Ao Root diam: 2.60 cm MITRAL VALVE                TRICUSPID VALVE MV Area (PHT): 3.99 cm     TR Peak grad:   38.2 mmHg MV Decel Time: 190 msec     TR Vmax:        309.00 cm/s MV E velocity: 79.50 cm/s MV A velocity: 123.00 cm/s  SHUNTS MV E/A ratio:  0.65         Systemic VTI:  0.55 m                             Systemic Diam: 2.00 cm Ida Rogue MD Electronically signed by Ida Rogue MD Signature Date/Time: 03/06/2020/12:39:03 PM    Final    ASSESSMENT AND PLAN:  Aimee Kirk  is a 86 y.o. female with a known history of dementia who is a resident at home [facility] comes in after she had a fall at her assisted living. Patient was evaluated in the emergency room her imaging studies were negative. She was discharged to go back to home place this morning. Patient son was not able to get her out of the car and stand up when he took her back. He brought her back to the emergency room for further evaluation  Acute coronary syndrome/NSTEMI -- patient came in after a fall and weakness. Noted to have elevated troponin of 7200 --Continue heparin drip, aspirin, low-dose beta-blocker-- heparin will be discontinued at 1700 -- appreciate [facility] cardiology Dr. Donivan Scull input. --echo of the heart shows EF of 50 to 55%, hypokinesis distal anterior and intercept all region, moderate to severe TR. -- Continue medical management per family request patient is a poor candidate for cardiac cath given agent dementia -avoiding statins  due to age and elevated CK from fall  Vaginally bleeding-- reported by RN -mild -no further episode, etiology unclear -hemoglobin stable  SIRS with ACS Sepsis ruled out at present -etiology unknown  -remains afebrile. UA not impressive. Leukocyte Estrase and nitrite negative, wbc 6-10 -workup still pending. -Blood cultures  negative. -Urine culture pending -chest x-ray negative for pneumonia -elevated white count of 18-- 14 K-- 12 K, tachycardia--resolved, lactic acid more than 2.0-- down to 1.4 -received empiric IV antibiotic with cefepime-- now discontinued -Pro calcitonin 0.6  Hyperglycemia -A1c 5.7 -could be stress related -sliding scale insulin  History of dementia without behavior change -supportive care  Elevated fibrin derivative -CT chest no PE. No evidence of pneumonia.  Fall with mild rhabdomyolysis -received IV fluids -follow up CPK trending down -CT head and cervical spine-- no trauma    Family Communication : son in the emergency room on 10/21 Consults : [facility] cardiology Code Status : DNR prior to arrival DVT prophylaxis : heparin drip admission status: inpatient patient came in with fall found to have acute MI/ACS and sepsis of unknown etiology. She will at least require two midnight stay in the hospital.  Procedures: none    Dispo: The patient is from: ALF              Anticipated d/c is to: ALF with [facility]              Anticipated d/c date is: 1 day              Patient currently is not medically stable to d/c. patient will return back to home place with home health tomorrow. She is currently on heparin drip.       TOTAL TIME TAKING CARE OF THIS PATIENT: 25 minutes.  >50% time spent on counselling and coordination of care  Note: This dictation was prepared with Dragon dictation along with smaller phrase technology. Any transcriptional errors that result from this process are unintentional.  Fritzi Mandes M.D    [facility]    CC: Primary care physician; [facility], Doctors MakingPatient ID: Aimee Kirk, female   DOB: 09/18/1922, 86 y.o.   MRN: [id number]

## 2020-03-07 NOTE — TOC Progression Note (Addendum)
Transition of Care [facility]) - Progression Note    Patient Details  Name: Aimee Kirk MRN: [id number] Date of Birth: 1923-01-05  [facility]) CM/SW Contact  Eileen Stanford, LCSW Phone Number: 03/07/2020, 12:23 PM  Clinical Narrative:   CSW spoke with [facility] admission director at [facility]. Butch Penny states pt was ambulatory with walker prior to admission. CSW explained that pt is a 2+ assist. Butch Penny states she doesn't think pt will do good at SNF, given pt's dementia. Butch Penny ask that CSW send the updated clinicals and she will review.  CSW received a return call from [place]. Butch Penny states they will accept pt back. Pt will d/c tomorrow per MD. Butch Penny is aware. Butch Penny states for CSW to fax summary tomorrow to [phone]. Also CSW will need to follow up with Son if he wants to transport her or for CSW to set up transport-- Pt's son he will decide tomorrow when they assess her mobility prior to d/c. Contact at [place] for the weekend is [place] [phone]).   CSW updated pt's Son. He is agreeable and appreciative that pt can return to [facility] tomorrow.     Expected Discharge Plan: Skilled Nursing Facility Barriers to Discharge: Continued Medical Work up, ED SNF auth  Expected Discharge Plan and Services Expected Discharge Plan: [place]                                               Social Determinants of Health (SDOH) Interventions    Readmission Risk Interventions No flowsheet data found.

## 2020-03-07 NOTE — Progress Notes (Signed)
Progress Note  Patient Name: Aimee Kirk Date of Encounter: 03/07/2020  Primary Cardiologist: New to [facility], consult by Rockey Situ  Subjective   Patient's son at bedside this morning. She had an uneventful night. No chest pain or dyspnea. He continues to want conservative management. Echo showed a low normal LVSF with an EF of 50-55% with HK of the distal anterior and anteroseptal wall concerning for distal LAD ischemia along with GR1DD, mildly elevated PASP, moderate MR, moderate to severe TR, and mild to moderate AS. She did have an episode of vaginal bleeding on 10/21 with HGB trend of 11.5-->10.3. She remains on heparin gtt. Vitals stable.   Inpatient Medications    Scheduled Meds: . aspirin  81 mg Oral Daily  . aspirin EC  325 mg Oral Once  . insulin aspart  0-5 Units Subcutaneous QHS  . insulin aspart  0-9 Units Subcutaneous TID WC  . metoprolol tartrate  25 mg Oral BID  . senna  1 tablet Oral BID   Continuous Infusions: . sodium chloride 100 mL/hr at 03/06/20 0817  . heparin 850 Units/hr (03/07/20 0007)   PRN Meds: acetaminophen **OR** acetaminophen, ondansetron **OR** ondansetron (ZOFRAN) IV, polyethylene glycol   Vital Signs    Vitals:   03/06/20 1050 03/06/20 1903 03/06/20 2337 03/07/20 0350  BP: (!) 103/59 130/63 129/77 (!) 131/55  Pulse: 81  80 95  Resp: (!) 25 20 18 20   Temp:   98 F (36.7 C) 98.1 F (36.7 C)  TempSrc:   Oral Oral  SpO2: 100% 100% 100% 96%  Weight:    49.9 kg  Height:        Intake/Output Summary (Last 24 hours) at 03/07/2020 4098 Last data filed at 03/07/2020 0400 Gross per 24 hour  Intake 1923.98 ml  Output 300 ml  Net 1623.98 ml   Filed Weights   03/05/20 1238 03/07/20 0350  Weight: 48.1 kg 49.9 kg    Telemetry    SR - Personally Reviewed  ECG    No new tracings - Personally Reviewed  Physical Exam   GEN: Elderly and frail appearing. No acute distress.   Neck: No JVD. Cardiac: RRR, II/VI systolic murmur RUSB, no  rubs, or gallops.  Respiratory: Clear to auscultation bilaterally.  GI: Soft, nontender, non-distended.   MS: No edema; No deformity. Neuro:  Alert and oriented x 3; Nonfocal.  Psych: Normal affect.  Labs    Chemistry Recent Labs  Lab 03/05/20 1245 03/06/20 0813  NA 141 141  K 4.0 4.3  CL 105 107  CO2 24 25  GLUCOSE 144* 86  BUN 24* 24*  CREATININE 0.91 0.76  CALCIUM 9.6 8.8*  PROT 7.5  --   ALBUMIN 4.3  --   AST 119*  --   ALT 39  --   ALKPHOS 83  --   BILITOT 0.9  --   GFRNONAA 54* >60  ANIONGAP 12 9     Hematology Recent Labs  Lab 03/05/20 1245 03/06/20 0304 03/07/20 0520  WBC 18.8* 14.7* 12.1*  RBC 4.46 3.72* 3.30*  HGB 13.8 11.5* 10.3*  HCT 42.7 35.2* 31.8*  MCV 95.7 94.6 96.4  MCH 30.9 30.9 31.2  MCHC 32.3 32.7 32.4  RDW 13.9 14.2 14.6  PLT 284 237 248    Cardiac EnzymesNo results for input(s): TROPONINI in the last 168 hours. No results for input(s): TROPIPOC in the last 168 hours.   BNPNo results for input(s): BNP, PROBNP in the last 168 hours.  DDimer No results for input(s): DDIMER in the last 168 hours.   Radiology    DG Pelvis 1-2 Views  Result Date: 03/05/2020 IMPRESSION: No acute abnormality seen. Electronically Signed   By: Inez Catalina M.D.   On: 03/05/2020 15:26   DG Knee 2 Views Left  Result Date: 03/05/2020 IMPRESSION: 1. No evidence of an acute fracture. 2. Moderate to marked severity degenerative changes with a moderate sized joint effusion. Electronically Signed   By: Virgina Norfolk M.D.   On: 03/05/2020 17:42   CT Head Wo Contrast  Result Date: 03/05/2020 IMPRESSION: No evidence of acute intracranial injury. Electronically Signed   By: Macy Mis M.D.   On: 03/05/2020 10:40   CT Angio Chest PE W and/or Wo Contrast  Result Date: 03/05/2020 IMPRESSION: No evidence of pulmonary embolus. Biapical scarring. Adjacent nodular density in the right upper lobe measures 11 mm, felt to be a component of the scarring. This  could be followed with repeat CT in 3-6 months to ensure stability. Coronary artery disease. Aortic Atherosclerosis (ICD10-I70.0). Electronically Signed   By: Rolm Baptise M.D.   On: 03/05/2020 19:05   CT Cervical Spine Wo Contrast  Result Date: 03/05/2020 IMPRESSION: No acute cervical spine fracture. 1.1 cm area of nodularity within the right upper lobe. May reflect scarring or neoplastic process. Follow-up as indicated given patient age. Electronically Signed   By: Macy Mis M.D.   On: 03/05/2020 10:47   DG Chest Port 1 View  Result Date: 03/05/2020 IMPRESSION: No acute abnormality noted. Electronically Signed   By: Inez Catalina M.D.   On: 03/05/2020 15:27    Cardiac Studies   2D echo 03/06/2020: 1. Left ventricular ejection fraction, by estimation, is 50 to 55%. The  left ventricle has low normal function. The left ventricle demonstrates  regional wall motion abnormalities (distal anterior and anteroseptal wall  and apical region). There is mild  left ventricular hypertrophy. Left ventricular diastolic parameters are  consistent with Grade I diastolic dysfunction (impaired relaxation).  2. Right ventricular systolic function is normal. The right ventricular  size is normal. There is mildly elevated pulmonary artery systolic  pressure. The estimated right ventricular systolic pressure is 67.8 mmHg.  3. Left atrial size was mild to moderately dilated.  4. Moderate mitral valve regurgitation.  5. Tricuspid valve regurgitation is moderate to severe.  6. The aortic valve is heavily calcified. Not well visualized.  Measurements obtained suggest mild to moderate stenosis. Highest aortic  valve mean gradient measures 21.0 mmHg suggesting moderate AS, other  measurements suggesting mild AS. __________  2D echo 12/2017: Normal LVEF, diastolic dysfunction, mild aortic stenosis, mild tricuspid regurgitation, mild pulmonary hypertension   Patient Profile     86 y.o. female with  history of dementia and thyroid disease who is being seen today for the evaluation of NSTEMI at the request of Dr. Posey Pronto.  Assessment & Plan    1. NSTEMI: -Evaluated by STEMI MD overnight on 10/20, and in the absence of chest pain with significant underlying comorbid conditions including dementia and advanced age, medical therapy was recommended after discussion with patient's son -She denies chest pain -Presentation in the context of a witnessed fall in the bathroom -HS-Tn peaked at [phone] and is currently down trending -Heparin gtt for 48 hours, which will be around 17:00 today -Echo concerning for distal LAD territory ischemia  -ASA -patient's family prefers to defer addition of Plavix for medical management given her advanced age, frail state with recent  fall with hip trauma, and vaginal bleeding.   -Defer statin in the setting of her advanced age -Lopressor  2. Fall with rhabdomyolysis: -Management per IM  3. Dementia: -Stable  4. Aortic stenosis: -Mild to moderate of echo this admission -Not likely to be a candidate for invasive therapy as outlined above  5. Vaginal bleeding: -No further episodes -HGB with a slight drop, though stable   For questions or updates, please contact [place] Please consult [email or web address] for contact info under Cardiology/STEMI.    Signed, Christell Faith, PA-C [place] Pager: [phone] 03/07/2020, 7:02 AM

## 2020-03-07 NOTE — Plan of Care (Signed)
CBG 60 - OJ given and encouraged. - Will make sure to assist with dinner feeding also.

## 2020-03-07 NOTE — Progress Notes (Signed)
[place] for Heparin infusion Indication: ACS/STEMI  Allergies  Allergen Reactions  . Remdesivir     Pt son states he absolutely does not want his mother to receive this medication     Patient Measurements: Height: 5\' 4"  (162.6 cm) Weight: 49.9 kg (110 lb) IBW/kg (Calculated) : 54.7  Vital Signs: Temp: 98.1 F (36.7 C) (10/22 0350) Temp Source: Oral (10/22 0350) BP: 131/55 (10/22 0350) Pulse Rate: 95 (10/22 0350)  Labs: Recent Labs    03/05/20 1245 03/05/20 1245 03/05/20 1530 03/05/20 1530 03/05/20 1620 03/05/20 1825 03/05/20 2002 03/06/20 0304 03/06/20 0813 03/06/20 1909 03/07/20 0520  HGB 13.8   < >  --   --   --   --   --  11.5*  --   --  10.3*  HCT 42.7  --   --   --   --   --   --  35.2*  --   --  31.8*  PLT 284  --   --   --   --   --   --  237  --   --  248  APTT  --   --   --   --  30  --   --   --   --   --   --   LABPROT  --   --   --   --  12.6  --   --   --   --   --   --   INR  --   --   --   --  1.0  --   --   --   --   --   --   HEPARINUNFRC  --   --   --   --   --   --   --  0.17*  --  0.29* 0.43  CREATININE 0.91  --   --   --   --   --   --   --  0.76  --   --   CKTOTAL  --   --  6,320*  --   --   --   --  6,444*  --   --   --   CKMB  --   --   --   --   --   --   --   --  105.2*  --   --   TROPONINIHS  --   --  7,220*   < >  --  0,258* 5,277*  --  6,903*  --   --    < > = values in this interval not displayed.    Estimated Creatinine Clearance: 33.1 mL/min (by C-G formula based on SCr of 0.76 mg/dL).   Medical History: Past Medical History:  Diagnosis Date  . Thyroid disease     Medications:  Confirmed with patient's son that patient was not on any anticoagulation prior to admission  Assessment: 86yo female with PMH dementia who was seen earlier today after a fall resulting in head injury. Patient was brought back to ED for further eval by son who stated that she was "unable to stand and get in the  car which is not normal for her". CT head showed no evidence of acute intracranial injury. 10/20 Troponin HS 7220, CK 6320; ECG Inferolateral injury pattern ACUTE MI / STEMI. Pharmacy has been consulted for heparin dosing and monitoring.   Baseline CBC WNL; aPTT 30,  PT 12.6, INR 1  10/21 HL @0306  - 0.17; subtherapeutic - bolus 1600 units and rate increased to 850 units/hr 10/21 HL @1909  - 0.29; original HL ordered for the afternoon was not drawn - nurse in ED was unaware she needed to draw level. Stat level was ordered.   Goal of Therapy:  Heparin level 0.3-0.7 units/ml Monitor platelets by anticoagulation protocol: Yes   Plan:  HL 0.29 - will not bolus since HL was drawn ~14 hours after rate change and is just slightly subtherapeutic. Will increase rate to 850 units/hr Recheck HL 8 hrs after rate change Continue to monitor CBC per protocol  10/22: HL@ 0520 = 0.43 Will continue pt on current rate and draw confirmation level in 8 hrs.  Manda Holstad D, PharmD 03/07/2020 6:49 AM

## 2020-03-07 NOTE — Progress Notes (Signed)
[place] for Heparin infusion Indication: ACS/STEMI  Allergies  Allergen Reactions   Remdesivir     Pt son states he absolutely does not want his mother to receive this medication     Patient Measurements: Height: 5\' 4"  (162.6 cm) Weight: 49.9 kg (110 lb) IBW/kg (Calculated) : 54.7  Vital Signs: Temp: 98.1 F (36.7 C) (10/22 1142) Temp Source: Oral (10/22 1142) BP: 112/61 (10/22 1142) Pulse Rate: 79 (10/22 1142)  Labs: Recent Labs    03/05/20 1245 03/05/20 1245 03/05/20 1530 03/05/20 1620 03/05/20 1825 03/05/20 2002 03/06/20 0304 03/06/20 0304 03/06/20 0813 03/06/20 1909 03/07/20 0520 03/07/20 1017 03/07/20 1308  HGB 13.8   < >  --   --   --   --  11.5*  --   --   --  10.3*  --   --   HCT 42.7  --   --   --   --   --  35.2*  --   --   --  31.8*  --   --   PLT 284  --   --   --   --   --  237  --   --   --  248  --   --   APTT  --   --   --  30  --   --   --   --   --   --   --   --   --   LABPROT  --   --   --  12.6  --   --   --   --   --   --   --   --   --   INR  --   --   --  1.0  --   --   --   --   --   --   --   --   --   HEPARINUNFRC  --   --   --   --   --   --  0.17*   < >  --  0.29* 0.43  --  0.39  CREATININE 0.91  --   --   --   --   --   --   --  0.76  --   --   --   --   CKTOTAL  --   --  6,320*  --   --   --  6,444*  --   --   --   --   --  2,826*  CKMB  --   --   --   --   --   --   --   --  105.2*  --   --   --   --   TROPONINIHS  --   --  7,220*  --    < > 9,878*  --   --  0,347*  --   --  1,926*  --    < > = values in this interval not displayed.    Estimated Creatinine Clearance: 33.1 mL/min (by C-G formula based on SCr of 0.76 mg/dL).   Medical History: Past Medical History:  Diagnosis Date   Thyroid disease     Medications:  Confirmed with patient's son that patient was not on any anticoagulation prior to admission  Assessment: 86yo female with PMH dementia who was seen earlier today after  a fall resulting in head injury. Patient was  brought back to ED for further eval by son who stated that she was "unable to stand and get in the car which is not normal for her". CT head showed no evidence of acute intracranial injury. 10/20 Troponin HS 7220, CK 6320; ECG Inferolateral injury pattern ACUTE MI / STEMI. Pharmacy has been consulted for heparin dosing and monitoring.   Baseline CBC WNL; aPTT 30, PT 12.6, INR 1  Goal of Therapy:  Heparin level 0.3-0.7 units/ml Monitor platelets by anticoagulation protocol: Yes   Plan:  --10/22 at 1308 HL = 0.39, therapeutic x 2. Continue heparin infusion at 850 units/hr --Per cardiology, continue heparin drip x 48h which will be until ~1700 today. --Will not order any further labs given anticipated completion of therapy  Benita Gutter 03/07/2020 2:10 PM

## 2020-03-07 NOTE — TOC Progression Note (Signed)
Transition of Care [facility]) - Progression Note    Patient Details  Name: Aimee Kirk MRN: [id number] Date of Birth: 1922/11/27  [facility] CM/SW Contact  Eileen Stanford, LCSW Phone Number: 03/07/2020, 10:53 AM  Clinical Narrative:   Pt has been f/o to surrounding facilities. If pt is not a wandering risk and no behaviors noted pt does not have to be in memory care unit. CSW will provide bed offers once available.    Expected Discharge Plan: Skilled Nursing Facility Barriers to Discharge: Continued Medical Work up, ED SNF auth  Expected Discharge Plan and Services Expected Discharge Plan: [place]                                               Social Determinants of Health (SDOH) Interventions    Readmission Risk Interventions No flowsheet data found.

## 2020-03-07 NOTE — NC FL2 (Addendum)
[place] LEVEL OF CARE SCREENING TOOL     IDENTIFICATION  Patient Name: Aimee Kirk Birthdate: 07-17-1922 Sex: female Admission Date (Current Location): 03/05/2020  [place] and [place] Number:  Engineering geologist and Address:  [facility], [address], [place], [place] [address]      Provider Number: [id number]  Attending Physician Name and Address:  Fritzi Mandes, MD  Relative Name and Phone Number:       Current Level of Care: Hospital Recommended Level of Care: Assisted Living  Prior Approval Number:    Date Approved/Denied:   PASRR Number:  Discharge Plan: ALF   Current Diagnoses: Patient Active Problem List   Diagnosis Date Noted  . ACS (acute coronary syndrome) ([place]) 03/05/2020  . Sepsis without acute organ dysfunction ([place])   . Fall   . Positive D dimer   . Lactic acidemia   . Dementia without behavioral disturbance (HCC)     Orientation RESPIRATION BLADDER Height & Weight     Self  Normal Continent Weight: 110 lb (49.9 kg) Height:  5\' 4"  (162.6 cm)  BEHAVIORAL SYMPTOMS/MOOD NEUROLOGICAL BOWEL NUTRITION STATUS      Continent Diet (heart healthy, thin liquids)  AMBULATORY STATUS COMMUNICATION OF NEEDS Skin   Extensive Assist Verbally Normal                       Personal Care Assistance Level of Assistance  Bathing, Dressing, Feeding Bathing Assistance: Maximum assistance Feeding assistance: Independent Dressing Assistance: Maximum assistance     Functional Limitations Info  Sight, Hearing, Speech Sight Info: Adequate Hearing Info: Adequate Speech Info: Adequate    SPECIAL CARE FACTORS FREQUENCY  PT (By licensed PT), OT (By licensed OT)     PT Frequency: 5x OT Frequency: 5x            Contractures Contractures Info: Not present    Additional Factors Info  Code Status, Allergies Code Status Info: DNR Allergies Info: Remdesivir           Current Medications (03/07/2020):  This  is the current hospital active medication list Current Facility-Administered Medications  Medication Dose Route Frequency Provider Last Rate Last Admin  . 0.9 %  sodium chloride infusion   Intravenous Continuous Fritzi Mandes, MD 75 mL/hr at 03/07/20 0915 Rate Change at 03/07/20 0915  . acetaminophen (TYLENOL) tablet 650 mg  650 mg Oral Q6H PRN Fritzi Mandes, MD   650 mg at 03/06/20 0109   Or  . acetaminophen (TYLENOL) suppository 650 mg  650 mg Rectal Q6H PRN Fritzi Mandes, MD      . aspirin chewable tablet 81 mg  81 mg Oral Daily Fritzi Mandes, MD   81 mg at 03/07/20 0914  . heparin ADULT infusion 100 units/mL (25000 units/23mL sodium chloride 0.45%)  850 Units/hr Intravenous Continuous Rauer, Forde Dandy, RPH 8.5 mL/hr at 03/07/20 0007 850 Units/hr at 03/07/20 0007  . insulin aspart (novoLOG) injection 0-5 Units  0-5 Units Subcutaneous QHS Fritzi Mandes, MD      . insulin aspart (novoLOG) injection 0-9 Units  0-9 Units Subcutaneous TID WC Fritzi Mandes, MD   2 Units at 03/06/20 1243  . metoprolol tartrate (LOPRESSOR) tablet 50 mg  50 mg Oral BID Fritzi Mandes, MD   50 mg at 03/07/20 0914  . ondansetron (ZOFRAN) tablet 4 mg  4 mg Oral Q6H PRN Fritzi Mandes, MD       Or  . ondansetron [facility]) injection  4 mg  4 mg Intravenous Q6H PRN Fritzi Mandes, MD      . polyethylene glycol (MIRALAX / GLYCOLAX) packet 17 g  17 g Oral Daily PRN Fritzi Mandes, MD      . senna Donavan Burnet) tablet 8.6 mg  1 tablet Oral BID Fritzi Mandes, MD   8.6 mg at 03/07/20 1499     Discharge Medications: Please see discharge summary for a list of discharge medications.  Relevant Imaging Results:  Relevant Lab Results:   Additional Information SSN;[id number]  Eileen Stanford, LCSW

## 2020-03-08 DIAGNOSIS — T796XXA Traumatic ischemia of muscle, initial encounter: Secondary | ICD-10-CM

## 2020-03-08 DIAGNOSIS — I248 Other forms of acute ischemic heart disease: Secondary | ICD-10-CM

## 2020-03-08 DIAGNOSIS — I214 Non-ST elevation (NSTEMI) myocardial infarction: Secondary | ICD-10-CM

## 2020-03-08 LAB — CBC
HCT: 25.5 % — ABNORMAL LOW (ref 36.0–46.0)
Hemoglobin: 8.2 g/dL — ABNORMAL LOW (ref 12.0–15.0)
MCH: 30.5 pg (ref 26.0–34.0)
MCHC: 32.2 g/dL (ref 30.0–36.0)
MCV: 94.8 fL (ref 80.0–100.0)
Platelets: 250 10*3/uL (ref 150–400)
RBC: 2.69 MIL/uL — ABNORMAL LOW (ref 3.87–5.11)
RDW: 14 % (ref 11.5–15.5)
WBC: 11.7 10*3/uL — ABNORMAL HIGH (ref 4.0–10.5)
nRBC: 0 % (ref 0.0–0.2)

## 2020-03-08 LAB — GLUCOSE, CAPILLARY
Glucose-Capillary: 98 mg/dL (ref 70–99)
Glucose-Capillary: 127 mg/dL — ABNORMAL HIGH (ref 70–99)

## 2020-03-08 MED ORDER — METOPROLOL TARTRATE 50 MG PO TABS: 50.0000 mg | ORAL_TABLET | Freq: Two times a day (BID) | ORAL | 0 refills | Status: AC

## 2020-03-08 MED ORDER — ASPIRIN 81 MG PO CHEW
81.0000 mg | CHEWABLE_TABLET | Freq: Every day | ORAL | 0 refills | Status: DC
Start: 2020-03-09 — End: 2020-03-08

## 2020-03-08 MED ORDER — METOPROLOL TARTRATE 50 MG PO TABS: 50.0000 mg | ORAL_TABLET | Freq: Two times a day (BID) | ORAL | 0 refills | Status: DC

## 2020-03-08 MED ORDER — ASPIRIN 81 MG PO CHEW: 81.0000 mg | CHEWABLE_TABLET | Freq: Every day | ORAL | 0 refills | Status: AC

## 2020-03-08 NOTE — Plan of Care (Signed)
  Problem: Clinical Measurements: Goal: Cardiovascular complication will be avoided Outcome: Progressing   Problem: Activity: Goal: Risk for activity intolerance will decrease Outcome: Progressing   Problem: Pain Managment: Goal: General experience of comfort will improve Outcome: Progressing   Problem: Safety: Goal: Ability to remain free from injury will improve Outcome: Progressing   

## 2020-03-08 NOTE — Discharge Summary (Signed)
[place] [facility] [place] NAME: Aimee Kirk    MR#:  [id number]  DATE OF BIRTH:  [birth year]  DATE OF ADMISSION:  03/05/2020 ADMITTING PHYSICIAN: Fritzi Mandes, MD  DATE OF DISCHARGE: 03/08/2020  PRIMARY CARE PHYSICIAN: [facility]    ADMISSION DIAGNOSIS:  Weakness [R53.1] Fall [W19.XXXA] ACS (acute coronary syndrome) (HCC) [I24.9] Positive D dimer [R79.89] Abnormal CK [R74.8] NSTEMI (non-ST elevated myocardial infarction) ([place]) [I21.4] Troponin I above reference range [R77.8] Lactic acidemia [E87.2] Leukocytosis, unspecified type [D72.829] Dementia without behavioral disturbance, unspecified dementia type ([place]) [F03.90]  DISCHARGE DIAGNOSIS:  NSTEMI--medical mnx Fall with Acute Rhabdomyolysis  SECONDARY DIAGNOSIS:   Past Medical History:  Diagnosis Date  . Thyroid disease     HOSPITAL COURSE:   [name] a86 y.o.femalewith a known history of dementia who is a resident at home place comes in after she had a fall at her assisted living. Patient was evaluated in the emergency room her imaging studies were negative. She was discharged to go back to home place this morning. Patient son was not able to get her out of the car and stand up when he took her back. He brought her back to the emergency room for further evaluation  Acute coronary syndrome/NSTEMI --patient came in after a fall and weakness. Noted to have elevated troponin of 7200 --RecievedIV heparin drip for 48 hours, aspirin, low-dose beta-blocker -- appreciate [facility] cardiology Dr. Donivan Scull input. --echo of the heart shows EF of 50 to 55%, hypokinesis distal anterior and intercept all region, moderate to severe TR. -- Continue medical management per family request patient is a poor candidate for cardiac cath given age and Advanced dementia -avoiding statins due to age and elevated CK from fall  Vaginally bleeding-- reported by RN -mild -no further  episode, etiology unclear -hemoglobin 8.2--some is dilutional also due to IVF received for Rhabdomyolysis  SIRS/Sepsis ruled out at present -etiology unknown  -remains afebrile. UA not impressive. Leukocyte Estrase and nitrite negative, wbc 6-10 -Blood cultures negative. -Urine culture neg -chest x-ray negative for pneumonia -elevated white count of 18-- 14 K-- 12 K, tachycardia--resolved, lactic acid more than 2.0-- down to 1.4 -received empiric IV antibiotic withcefepime-- now discontinued -Pro calcitonin 0.6  Hyperglycemia -A1c 5.7 -could be stress related -sliding scale insulin  History of dementia without behavior change -supportive care  Elevated fibrin derivative -CT chest no PE. No evidence of pneumonia.  Fall with mild rhabdomyolysis -received IV fluids -CPK trending down -CT head and cervical spine--no trauma    Family Communication:son on the phone today Consults:[facility] cardiology Code Status:DNR prior to arrival DVT prophylaxis:heparin drip admission status:inpatient Procedures: none    Dispo: The patient is from: ALF  Anticipated d/c is to: ALF with [facility]  Anticipated d/c date YO:VZCHY  Patient currently is  Medically best at baseline to d/c. patient will return back to ALF with home health today.   Laurine Blazer aware of pt d/c  CONSULTS OBTAINED:  Treatment Team:  Minna Merritts, MD  DRUG ALLERGIES:   Allergies  Allergen Reactions  . Remdesivir     Pt son states he absolutely does not want his mother to receive this medication     DISCHARGE MEDICATIONS:   Allergies as of 03/08/2020      Reactions   Remdesivir    Pt son states he absolutely does not want his mother to receive this medication       Medication List    TAKE  these medications   aspirin 81 MG chewable tablet Chew 1 tablet (81 mg total) by mouth daily. Start taking on: March 09, 2020   metoprolol tartrate 50  MG tablet Commonly known as: LOPRESSOR Take 1 tablet (50 mg total) by mouth 2 (two) times daily.       If you experience worsening of your admission symptoms, develop shortness of breath, life threatening emergency, suicidal or homicidal thoughts you must seek medical attention immediately by calling 911 or calling your MD immediately  if symptoms less severe.  You Must read complete instructions/literature along with all the possible adverse reactions/side effects for all the Medicines you take and that have been prescribed to you. Take any new Medicines after you have completely understood and accept all the possible adverse reactions/side effects.   Please note  You were cared for by a hospitalist during your hospital stay. If you have any questions about your discharge medications or the care you received while you were in the hospital after you are discharged, you can call the unit and asked to speak with the hospitalist on call if the hospitalist that took care of you is not available. Once you are discharged, your primary care physician will handle any further medical issues. Please note that NO REFILLS for any discharge medications will be authorized once you are discharged, as it is imperative that you return to your primary care physician (or establish a relationship with a primary care physician if you do not have one) for your aftercare needs so that they can reassess your need for medications and monitor your lab values. Today   SUBJECTIVE   Confused at baseline. Some agitation causing tachy intermittently HR 87-92 at present Ate BF per RN  VITAL SIGNS:  Blood pressure 110/80, pulse (!) 116, temperature 97.8 F (36.6 C), temperature source Oral, resp. rate 20, height 5\' 4"  (1.626 m), weight 51.7 kg, SpO2 97 %.  I/O:    Intake/Output Summary (Last 24 hours) at 03/08/2020 1152 Last data filed at 03/08/2020 1119 Gross per 24 hour  Intake 300 ml  Output 0 ml  Net 300 ml     PHYSICAL EXAMINATION:   Physical Examlimited  GENERAL:  86 y.o.-year-old patient lying in the bed with no acute distress.  LUNGS: Normal breath sounds bilaterally, no wheezing, rales, rhonchi. No use of accessory muscles of respiration.  CARDIOVASCULAR: S1, S2 normal. No murmurs, rubs, or gallops.  ABDOMEN: Soft, nontender, nondistended. Bowel sounds present. No organomegaly or mass.  EXTREMITIES: No cyanosis, clubbing or edema b/l.   bruise + right arm and left index finger NEUROLOGIC: grossly nonfocal-- moves all extremities well PSYCHIATRIC:  patient is alert and awake. Pleasant confusion at baseline. SKIN: No obvious rash, lesion, or ulcer. Bruise on arm DATA REVIEW:   CBC  Recent Labs  Lab 03/08/20 0823  WBC 11.7*  HGB 8.2*  HCT 25.5*  PLT 250    Chemistries  Recent Labs  Lab 03/05/20 1245 03/05/20 1245 03/05/20 1530 03/06/20 0813  NA 141   < >  --  141  K 4.0   < >  --  4.3  CL 105   < >  --  107  CO2 24   < >  --  25  GLUCOSE 144*   < >  --  86  BUN 24*   < >  --  24*  CREATININE 0.91   < >  --  0.76  CALCIUM 9.6   < >  --  8.8*  MG  --   --  2.4  --   AST 119*  --   --   --   ALT 39  --   --   --   ALKPHOS 83  --   --   --   BILITOT 0.9  --   --   --    < > = values in this interval not displayed.    Microbiology Results   Recent Results (from the past 240 hour(s))  Respiratory Panel by RT PCR (Flu A&B, Covid) - Nasopharyngeal Swab     Status: None   Collection Time: 03/05/20  3:15 PM   Specimen: Nasopharyngeal Swab  Result Value Ref Range Status   SARS Coronavirus 2 by RT PCR NEGATIVE NEGATIVE Final    Comment: (NOTE) SARS-CoV-2 target nucleic acids are NOT DETECTED.  The SARS-CoV-2 RNA is generally detectable in upper respiratoy specimens during the acute phase of infection. The lowest concentration of SARS-CoV-2 viral copies this assay can detect is 131 copies/mL. A negative result does not preclude SARS-Cov-2 infection and should not be  used as the sole basis for treatment or other patient management decisions. A negative result may occur with  improper specimen collection/handling, submission of specimen other than nasopharyngeal swab, presence of viral mutation(s) within the areas targeted by this assay, and inadequate number of viral copies (<131 copies/mL). A negative result must be combined with clinical observations, patient history, and epidemiological information. The expected result is Negative.  Fact Sheet for Patients:  PinkCheek.be  Fact Sheet for Healthcare Providers:  GravelBags.it  This test is no t yet approved or cleared by the [place] FDA and  has been authorized for detection and/or diagnosis of SARS-CoV-2 by FDA under an Emergency Use Authorization (EUA). This EUA will remain  in effect (meaning this test can be used) for the duration of the COVID-19 declaration under Section 564(b)(1) of the Act, 21 U.S.C. section 360bbb-3(b)(1), unless the authorization is terminated or revoked sooner.     Influenza A by PCR NEGATIVE NEGATIVE Final   Influenza B by PCR NEGATIVE NEGATIVE Final    Comment: (NOTE) The Xpert Xpress SARS-CoV-2/FLU/RSV assay is intended as an aid in  the diagnosis of influenza from Nasopharyngeal swab specimens and  should not be used as a sole basis for treatment. Nasal washings and  aspirates are unacceptable for Xpert Xpress SARS-CoV-2/FLU/RSV  testing.  Fact Sheet for Patients: PinkCheek.be  Fact Sheet for Healthcare Providers: GravelBags.it  This test is not yet approved or cleared by the [place] FDA and  has been authorized for detection and/or diagnosis of SARS-CoV-2 by  FDA under an Emergency Use Authorization (EUA). This EUA will remain  in effect (meaning this test can be used) for the duration of the  Covid-19 declaration under Section  564(b)(1) of the Act, 21  U.S.C. section 360bbb-3(b)(1), unless the authorization is  terminated or revoked. Performed at [facility], [address] [place], [place] [address]   Blood culture (routine x 2)     Status: None (Preliminary result)   Collection Time: 03/05/20  6:25 PM   Specimen: BLOOD  Result Value Ref Range Status   Specimen Description BLOOD LEFT ANTECUBITAL  Final   Special Requests   Final    BOTTLES DRAWN AEROBIC AND ANAEROBIC Blood Culture results may not be optimal due to an inadequate volume of blood received in culture bottles   Culture   Final    NO GROWTH  3 DAYS Performed at [facility] [address] [place], [place] [address]    Report Status PENDING  Incomplete  Blood culture (routine x 2)     Status: None (Preliminary result)   Collection Time: 03/05/20  6:35 PM   Specimen: BLOOD  Result Value Ref Range Status   Specimen Description BLOOD RIGHT ANTECUBITAL  Final   Special Requests   Final    BOTTLES DRAWN AEROBIC AND ANAEROBIC Blood Culture results may not be optimal due to an inadequate volume of blood received in culture bottles   Culture   Final    NO GROWTH 3 DAYS Performed at [facility], [address] [place], [place] [address]    Report Status PENDING  Incomplete  Urine culture     Status: None   Collection Time: 03/05/20  7:40 PM   Specimen: In/Out Cath Urine  Result Value Ref Range Status   Specimen Description   Final    IN/OUT CATH URINE Performed at [facility], [address], [place], [place] [address]    Special Requests   Final    NONE Performed at [facility], [address], [place], [place] [address]    Culture   Final    NO GROWTH Performed at [facility], [place] [address], [place], [place] [address]    Report Status 03/07/2020 FINAL  Final    RADIOLOGY:  No results found.   CODE STATUS:     Code Status Orders  (From admission, onward)          Start     Ordered   03/06/20 0742  Do not attempt resuscitation (DNR)  Continuous       Question Answer Comment  In the event of cardiac or respiratory ARREST Do not call a "code blue"   In the event of cardiac or respiratory ARREST Do not perform Intubation, CPR, defibrillation or ACLS   In the event of cardiac or respiratory ARREST Use medication by any route, position, wound care, and other measures to relive pain and suffering. May use oxygen, suction and manual treatment of airway obstruction as needed for comfort.   Comments d/w son in the ER and confirms it      03/06/20 0741        Code Status History    Date Active Date Inactive Code Status Order ID Comments User Context   03/05/2020 1917 03/06/2020 0741 DNR [id number]  Sharion Settler, NP ED   Advance Care Planning Activity       TOTAL TIME TAKING CARE OF THIS PATIENT: *35* minutes.    Fritzi Mandes M.D  [facility]    CC: Primary care physician; [facility]

## 2020-03-08 NOTE — TOC Transition Note (Signed)
Transition of Care [facility]) - CM/SW Discharge Note   Patient Details  Name: Aimee Kirk MRN: [id number] Date of Birth: [birth year]  Transition of Care [facility]) CM/SW Contact:  Truitt Merle, LCSW Phone Number: 03/08/2020, 5:26 PM   Clinical Narrative:    Patient medically ready for discharge per Dr. Posey Pronto. Spoke with Dinesaia at [facility] [phone]) to provide update. Confirmed patient can return. Faxed discharge summary and home health orders to [phone]. Son to provide transport for patient back to [place]. No further TOC needs.     Final next level of care: Assisted Living Barriers to Discharge: Barriers Resolved   Patient Goals and CMS Choice Patient states their goals for this hospitalization and ongoing recovery are:: Pt has dementia CMS Medicare.gov Compare Post Acute Care list provided to:: Other (Comment Required) (Allor,RAYMOND L (Son) [phone])    Discharge Placement                       Discharge Plan and Services                                     Social Determinants of Health (SDOH) Interventions     Readmission Risk Interventions No flowsheet data found.

## 2020-03-08 NOTE — Progress Notes (Addendum)
Progress Note  Patient Name: Aimee Kirk Date of Encounter: 03/08/2020  Primary Cardiologist: New to [facility], consult by [facility]  Subjective   Pleasantly agitated overnight and this morning. Laying in bed without covers or clothing this morning. Heart rates in the 120s to 140s bpm, in sinus. She denies any chest pain or dyspnea. No further noted vaginal bleeding. She has completed 48 hours of heparin gtt. Vitals stable.   Inpatient Medications    Scheduled Meds: . aspirin  81 mg Oral Daily  . insulin aspart  0-5 Units Subcutaneous QHS  . insulin aspart  0-9 Units Subcutaneous TID WC  . metoprolol tartrate  50 mg Oral BID  . senna  1 tablet Oral BID   Continuous Infusions:  PRN Meds: acetaminophen **OR** acetaminophen, ondansetron **OR** ondansetron (ZOFRAN) IV, polyethylene glycol   Vital Signs    Vitals:   03/08/20 0305 03/08/20 0530 03/08/20 0532 03/08/20 0534  BP:  (!) 164/57    Pulse:  (!) 111 (!) 109   Resp:  16    Temp:  98.5 F (36.9 C)    TempSrc:  Oral    SpO2:  98% 98%   Weight: 52.2 kg   51.7 kg  Height:        Intake/Output Summary (Last 24 hours) at 03/08/2020 0720 Last data filed at 03/07/2020 2046 Gross per 24 hour  Intake 360 ml  Output 200 ml  Net 160 ml   Filed Weights   03/07/20 0350 03/08/20 0305 03/08/20 0534  Weight: 49.9 kg 52.2 kg 51.7 kg    Telemetry    Sinus tachycardia, low 100s to 120s mostly with rare peaks into the 130s bpm - Personally Reviewed  ECG    No new tracings - Personally Reviewed  Physical Exam   GEN: Elderly and frail appearing. No acute distress.   Neck: No JVD. Cardiac: Tachycardic and regular, II/VI systolic murmur RUSB, no rubs, or gallops.  Respiratory: Clear to auscultation bilaterally.  GI: Soft, nontender, non-distended.   MS: No edema; No deformity. Neuro:  Alert and oriented x 3; Nonfocal.  Psych: Normal affect.  Labs    Chemistry Recent Labs  Lab 03/05/20 1245 03/06/20 0813  NA  141 141  K 4.0 4.3  CL 105 107  CO2 24 25  GLUCOSE 144* 86  BUN 24* 24*  CREATININE 0.91 0.76  CALCIUM 9.6 8.8*  PROT 7.5  --   ALBUMIN 4.3  --   AST 119*  --   ALT 39  --   ALKPHOS 83  --   BILITOT 0.9  --   GFRNONAA 54* >60  ANIONGAP 12 9     Hematology Recent Labs  Lab 03/05/20 1245 03/06/20 0304 03/07/20 0520  WBC 18.8* 14.7* 12.1*  RBC 4.46 3.72* 3.30*  HGB 13.8 11.5* 10.3*  HCT 42.7 35.2* 31.8*  MCV 95.7 94.6 96.4  MCH 30.9 30.9 31.2  MCHC 32.3 32.7 32.4  RDW 13.9 14.2 14.6  PLT 284 237 248    Cardiac EnzymesNo results for input(s): TROPONINI in the last 168 hours. No results for input(s): TROPIPOC in the last 168 hours.   BNPNo results for input(s): BNP, PROBNP in the last 168 hours.   DDimer No results for input(s): DDIMER in the last 168 hours.   Radiology    DG Pelvis 1-2 Views  Result Date: 03/05/2020 IMPRESSION: No acute abnormality seen. Electronically Signed   By: Inez Catalina M.D.   On: 03/05/2020 15:26  DG Knee 2 Views Left  Result Date: 03/05/2020 IMPRESSION: 1. No evidence of an acute fracture. 2. Moderate to marked severity degenerative changes with a moderate sized joint effusion. Electronically Signed   By: Virgina Norfolk M.D.   On: 03/05/2020 17:42   CT Head Wo Contrast  Result Date: 03/05/2020 IMPRESSION: No evidence of acute intracranial injury. Electronically Signed   By: Macy Mis M.D.   On: 03/05/2020 10:40   CT Angio Chest PE W and/or Wo Contrast  Result Date: 03/05/2020 IMPRESSION: No evidence of pulmonary embolus. Biapical scarring. Adjacent nodular density in the right upper lobe measures 11 mm, felt to be a component of the scarring. This could be followed with repeat CT in 3-6 months to ensure stability. Coronary artery disease. Aortic Atherosclerosis (ICD10-I70.0). Electronically Signed   By: Rolm Baptise M.D.   On: 03/05/2020 19:05   CT Cervical Spine Wo Contrast  Result Date: 03/05/2020 IMPRESSION: No acute  cervical spine fracture. 1.1 cm area of nodularity within the right upper lobe. May reflect scarring or neoplastic process. Follow-up as indicated given patient age. Electronically Signed   By: Macy Mis M.D.   On: 03/05/2020 10:47   DG Chest Port 1 View  Result Date: 03/05/2020 IMPRESSION: No acute abnormality noted. Electronically Signed   By: Inez Catalina M.D.   On: 03/05/2020 15:27    Cardiac Studies   2D echo 03/06/2020: 1. Left ventricular ejection fraction, by estimation, is 50 to 55%. The  left ventricle has low normal function. The left ventricle demonstrates  regional wall motion abnormalities (distal anterior and anteroseptal wall  and apical region). There is mild  left ventricular hypertrophy. Left ventricular diastolic parameters are  consistent with Grade I diastolic dysfunction (impaired relaxation).  2. Right ventricular systolic function is normal. The right ventricular  size is normal. There is mildly elevated pulmonary artery systolic  pressure. The estimated right ventricular systolic pressure is 16.1 mmHg.  3. Left atrial size was mild to moderately dilated.  4. Moderate mitral valve regurgitation.  5. Tricuspid valve regurgitation is moderate to severe.  6. The aortic valve is heavily calcified. Not well visualized.  Measurements obtained suggest mild to moderate stenosis. Highest aortic  valve mean gradient measures 21.0 mmHg suggesting moderate AS, other  measurements suggesting mild AS. __________  2D echo 12/2017: Normal LVEF, diastolic dysfunction, mild aortic stenosis, mild tricuspid regurgitation, mild pulmonary hypertension   Patient Profile     86 y.o. female with history of dementia and thyroid disease who is being seen today for the evaluation of NSTEMI at the request of Dr. Posey Pronto.  Assessment & Plan    1. NSTEMI: -Evaluated by STEMI MD overnight on 10/20, and in the absence of chest pain with significant underlying comorbid conditions  including dementia and advanced age, medical therapy was recommended after discussion with patient's son -She denies chest pain -Presentation in the context of a witnessed fall in the bathroom -HS-Tn peaked at [phone] and is currently down trending -She has completed heparin gtt x 48 hours for medical therapy -Echo concerning for distal LAD territory ischemia  -ASA -Defer addition of Plavix given her advanced age, recent fall, vaginal bleeding, and dementia, family agrees  -Defer statin in the setting of her advanced age -Lopressor  2. Fall with rhabdomyolysis: -Management per IM  3. Dementia: -Stable  4. Aortic stenosis: -Mild to moderate of echo this admission -Not likely to be a candidate for invasive therapy as outlined above  5. Vaginal  bleeding: -No further episodes -HGB stable on last check -Check CBC this morning with noted sinus tachycardia   6. Sinus tachycardia: -Possibly in the setting of increased agitation this morning -Check CBC given prior vaginal bleeding  -Possible discharge home later today pending CBC and heart rates once staffed by Dr. Percival Spanish, MD      For questions or updates, please contact [facility] Please consult [email or web address] for contact info under Cardiology/STEMI.    Signed, Christell Faith, PA-C [facility] Pager: [phone] 03/08/2020, 7:20 AM   History and all data above reviewed. The patient has no complaints of pain or dyspnea.  She is confused but pleasant.  Patient examined.  I agree with the findings as above.  The patient exam reveals COR:RRR, rapid  ,  Lungs: Decreased breath sounds but clear  ,  Abd: Positive bowel sounds, no rebound no guarding, Ext no edema  .  All available labs, radiology testing, previous records reviewed. Agree with documented assessment and plan.    DEMAND ISCHEMIA:  Conservative therapy.  No intervention planned with comorbid conditions.   We are planning no change in there meds or further evaluation.     Anemia:  Acute on chronic without obvious acute bleed.  She was tachycardic this morning but she was agitated and currently heart rate is OK.  Some increased heart rate likely driven by anemia.  Continue beta blocker.  Plans per primary team.    Jeneen Rinks Kassidy Dockendorf  12:00 PM  03/08/2020

## 2020-03-10 DIAGNOSIS — I214 Non-ST elevation (NSTEMI) myocardial infarction: Secondary | ICD-10-CM | POA: Diagnosis not present

## 2020-03-10 DIAGNOSIS — Z79899 Other long term (current) drug therapy: Secondary | ICD-10-CM | POA: Diagnosis not present

## 2020-03-10 DIAGNOSIS — R410 Disorientation, unspecified: Secondary | ICD-10-CM | POA: Diagnosis not present

## 2020-03-10 DIAGNOSIS — R5381 Other malaise: Secondary | ICD-10-CM | POA: Diagnosis not present

## 2020-03-10 DIAGNOSIS — R69 Illness, unspecified: Secondary | ICD-10-CM | POA: Diagnosis not present

## 2020-03-10 DIAGNOSIS — R000 Tachycardia, unspecified: Secondary | ICD-10-CM | POA: Diagnosis not present

## 2020-03-10 DIAGNOSIS — D72829 Elevated white blood cell count, unspecified: Secondary | ICD-10-CM | POA: Diagnosis not present

## 2020-03-10 DIAGNOSIS — M25462 Effusion, left knee: Secondary | ICD-10-CM | POA: Diagnosis not present

## 2020-03-10 DIAGNOSIS — R238 Other skin changes: Secondary | ICD-10-CM | POA: Diagnosis not present

## 2020-03-10 LAB — CULTURE, BLOOD (ROUTINE X 2)
Culture: NO GROWTH
Culture: NO GROWTH

## 2020-03-10 LAB — HEMOGLOBIN A1C
Hgb A1c MFr Bld: 5.4 % (ref 4.8–5.6)
Mean Plasma Glucose: 108 mg/dL

## 2020-03-11 ENCOUNTER — Emergency Department
Admission: EM | Admit: 2020-03-11 | Discharge: 2020-03-11 | Disposition: A | Discharge status: Home / Self Care | Payer: Medicare Other | Attending: Emergency Medicine | Admitting: Emergency Medicine

## 2020-03-11 ENCOUNTER — Other Ambulatory Visit: Payer: Self-pay

## 2020-03-11 DIAGNOSIS — F0390 Unspecified dementia without behavioral disturbance: Secondary | ICD-10-CM | POA: Diagnosis not present

## 2020-03-11 DIAGNOSIS — D649 Anemia, unspecified: Secondary | ICD-10-CM | POA: Diagnosis not present

## 2020-03-11 DIAGNOSIS — N3001 Acute cystitis with hematuria: Secondary | ICD-10-CM | POA: Insufficient documentation

## 2020-03-11 DIAGNOSIS — R799 Abnormal finding of blood chemistry, unspecified: Secondary | ICD-10-CM | POA: Diagnosis present

## 2020-03-11 DIAGNOSIS — R69 Illness, unspecified: Secondary | ICD-10-CM | POA: Diagnosis not present

## 2020-03-11 DIAGNOSIS — R0902 Hypoxemia: Secondary | ICD-10-CM | POA: Diagnosis not present

## 2020-03-11 DIAGNOSIS — Z7982 Long term (current) use of aspirin: Secondary | ICD-10-CM | POA: Diagnosis not present

## 2020-03-11 HISTORY — DX: Unspecified dementia, unspecified severity, without behavioral disturbance, psychotic disturbance, mood disturbance, and anxiety: F03.90

## 2020-03-11 LAB — CBC
HCT: 26.5 % — ABNORMAL LOW (ref 36.0–46.0)
Hemoglobin: 8.3 g/dL — ABNORMAL LOW (ref 12.0–15.0)
MCH: 30.6 pg (ref 26.0–34.0)
MCHC: 31.3 g/dL (ref 30.0–36.0)
MCV: 97.8 fL (ref 80.0–100.0)
Platelets: 360 10*3/uL (ref 150–400)
RBC: 2.71 MIL/uL — ABNORMAL LOW (ref 3.87–5.11)
RDW: 15.7 % — ABNORMAL HIGH (ref 11.5–15.5)
WBC: 11.1 10*3/uL — ABNORMAL HIGH (ref 4.0–10.5)
nRBC: 0 % (ref 0.0–0.2)

## 2020-03-11 LAB — BASIC METABOLIC PANEL
Anion gap: 9 (ref 5–15)
BUN: 20 mg/dL (ref 8–23)
CO2: 23 mmol/L (ref 22–32)
Calcium: 8.4 mg/dL — ABNORMAL LOW (ref 8.9–10.3)
Chloride: 110 mmol/L (ref 98–111)
Creatinine, Ser: 0.68 mg/dL (ref 0.44–1.00)
GFR, Estimated: >60 mL/min (ref >60)
Glucose, Bld: 105 mg/dL — ABNORMAL HIGH (ref 70–99)
Potassium: 3.9 mmol/L (ref 3.5–5.1)
Sodium: 142 mmol/L (ref 135–145)

## 2020-03-11 LAB — URINALYSIS, COMPLETE (UACMP) WITH MICROSCOPIC
Bilirubin Urine: NEGATIVE
Glucose, UA: NEGATIVE mg/dL
Ketones, ur: NEGATIVE mg/dL
Nitrite: NEGATIVE
Protein, ur: 30 mg/dL — AB
Specific Gravity, Urine: 1.025 (ref 1.005–1.030)
pH: 5 (ref 5.0–8.0)

## 2020-03-11 LAB — TYPE AND SCREEN
ABO/RH(D): A POS
Antibody Screen: NEGATIVE

## 2020-03-11 MED ORDER — CEPHALEXIN 500 MG PO CAPS: 500.0000 mg | ORAL_CAPSULE | Freq: Three times a day (TID) | ORAL | 0 refills | Status: AC

## 2020-03-11 MED ORDER — FERROUS SULFATE 325 (65 FE) MG PO TABS: 325.0000 mg | ORAL_TABLET | Freq: Every day | ORAL | 0 refills | Status: DC

## 2020-03-11 MED ORDER — CEPHALEXIN 500 MG PO CAPS
500.0000 mg | ORAL_CAPSULE | Freq: Once | ORAL | Status: AC
  Administered 2020-03-11: 500 mg via ORAL
  Filled 2020-03-11: qty 1

## 2020-03-11 NOTE — ED Triage Notes (Signed)
Pt arrived via ems from home place. Seen last week for fall but was admitted for heart attack. Given heparin in hospital. Pt hemoglobin has dropped since in hospital. Pt confused hx of dementia, son with pt at this time. NAD noted at this time

## 2020-03-11 NOTE — ED Provider Notes (Signed)
[facility] Emergency Department Provider Note  ____________________________________________   First MD Initiated Contact with Patient 03/11/20 1526     (approximate)  I have reviewed the triage vital signs and the nursing notes.   HISTORY  Chief Complaint Abnormal Lab and GI Bleeding    HPI Aimee Kirk is a 86 y.o. female  Here with down-trending hemoglobin, concern for possible GI bleed. Pt was just hospitalized with traumatic rhabdo and NSTEMI, briefly on heparin. She was discharged back to her facility and had been recovering well per report. However, MD at facility checked on pt today and noticed her hgb had been downtrending in hospital, so sent her here for eval. Pt reportedly had some vaginal bleeding in hospital but this resolved. No ongoing bleeding or melena noted. Pt denies any specific complaints though history limited by dementia.  Level 5 caveat invoked as remainder of history, ROS, and physical exam limited due to patient's dementia.     Past Medical History:  Diagnosis Date  . Dementia ([place])   . Thyroid disease     Patient Active Problem List   Diagnosis Date Noted  . NSTEMI (non-ST elevated myocardial infarction) ([place])   . Traumatic rhabdomyolysis ([place])   . ACS (acute coronary syndrome) ([place]) 03/05/2020  . Sepsis without acute organ dysfunction ([place])   . Fall   . Positive D dimer   . Lactic acidemia   . Dementia without behavioral disturbance [facility])     Past Surgical History:  Procedure Laterality Date  . ABDOMINAL HYSTERECTOMY      Prior to Admission medications   Medication Sig Start Date End Date Taking? Authorizing Provider  aspirin 81 MG chewable tablet Chew 1 tablet (81 mg total) by mouth daily. 03/09/20   Fritzi Mandes, MD  cephALEXin (KEFLEX) 500 MG capsule Take 1 capsule (500 mg total) by mouth 3 (three) times daily for 5 days. 03/11/20 03/16/20  Duffy Bruce, MD  ferrous sulfate 325 (65 FE) MG tablet Take 1  tablet (325 mg total) by mouth daily for 14 days. 03/11/20 03/25/20  Duffy Bruce, MD  metoprolol tartrate (LOPRESSOR) 50 MG tablet Take 1 tablet (50 mg total) by mouth 2 (two) times daily. 03/08/20   Fritzi Mandes, MD    Allergies Remdesivir  Family History  Problem Relation Age of Onset  . Arthritis Mother   . Heart disease Father     Social History Social History   Tobacco Use  . Smoking status: Never Smoker  . Smokeless tobacco: Never Used  Vaping Use  . Vaping Use: Never used  Substance Use Topics  . Alcohol use: Never  . Drug use: Never    Review of Systems  Review of Systems  Unable to perform ROS: Dementia     ____________________________________________  PHYSICAL EXAM:      VITAL SIGNS: ED Triage Vitals  Enc Vitals Group     BP 03/11/20 1250 (!) 131/50     Pulse Rate 03/11/20 1250 94     Resp 03/11/20 1250 18     Temp 03/11/20 1250 97.6 F (36.4 C)     Temp Source 03/11/20 1250 Oral     SpO2 03/11/20 1250 100 %     Weight 03/11/20 1251 106 lb (48.1 kg)     Height 03/11/20 1251 5\' 4"  (1.626 m)     Head Circumference --      Peak Flow --      Pain Score 03/11/20 1251 0  Pain Loc --      Pain Edu? --      Excl. in [place]? --      Physical Exam Vitals and nursing note reviewed.  Constitutional:      General: She is not in acute distress.    Appearance: She is well-developed.  HENT:     Head: Normocephalic and atraumatic.  Eyes:     Conjunctiva/sclera: Conjunctivae normal.  Cardiovascular:     Rate and Rhythm: Normal rate and regular rhythm.     Heart sounds: Normal heart sounds. No murmur heard.  No friction rub.  Pulmonary:     Effort: Pulmonary effort is normal. No respiratory distress.     Breath sounds: Normal breath sounds. No wheezing or rales.  Abdominal:     General: There is no distension.     Palpations: Abdomen is soft.     Tenderness: There is no abdominal tenderness.  Musculoskeletal:     Cervical back: Neck supple.   Skin:    General: Skin is warm.     Capillary Refill: Capillary refill takes less than 2 seconds.  Neurological:     Mental Status: She is alert. Mental status is at baseline. She is disoriented.     Motor: No abnormal muscle tone.       ____________________________________________   LABS (all labs ordered are listed, but only abnormal results are displayed)  Labs Reviewed  BASIC METABOLIC PANEL - Abnormal; Notable for the following components:      Result Value   Glucose, Bld 105 (*)    Calcium 8.4 (*)    All other components within normal limits  CBC - Abnormal; Notable for the following components:   WBC 11.1 (*)    RBC 2.71 (*)    Hemoglobin 8.3 (*)    HCT 26.5 (*)    RDW 15.7 (*)    All other components within normal limits  URINALYSIS, COMPLETE (UACMP) WITH MICROSCOPIC - Abnormal; Notable for the following components:   Color, Urine AMBER (*)    APPearance TURBID (*)    Hgb urine dipstick SMALL (*)    Protein, ur 30 (*)    Leukocytes,Ua MODERATE (*)    Bacteria, UA MANY (*)    All other components within normal limits  CBG MONITORING, ED  POC OCCULT BLOOD, ED  TYPE AND SCREEN    ____________________________________________  EKG: Normal sinus rhythm with occasional PVCs. VR 89, PR 120, QRS 86, QTc 464. No acute ST elevations or depressions. No ischemia or infarct. ________________________________________  RADIOLOGY All imaging, including plain films, CT scans, and ultrasounds, independently reviewed by me, and interpretations confirmed via formal radiology reads.  ED MD interpretation:   None  Official radiology report(s): No results found.  ____________________________________________  PROCEDURES   Procedure(s) performed (including Critical Care):  Procedures  ____________________________________________  INITIAL IMPRESSION / MDM / [place] / ED COURSE  As part of my medical decision making, I reviewed the following data within  the [place] notes reviewed and incorporated, Old chart reviewed, Notes from prior ED visits, and [place] Controlled Substance Database       *ADALIN VANDERPLOEG was evaluated in Emergency Department on 03/11/2020 for the symptoms described in the history of present illness. She was evaluated in the context of the global COVID-19 pandemic, which necessitated consideration that the patient might be at risk for infection with the SARS-CoV-2 virus that causes COVID-19. Institutional protocols and algorithms that pertain to the  evaluation of patients at risk for COVID-19 are in a state of rapid change based on information released by regulatory bodies including the CDC and federal and state organizations. These policies and algorithms were followed during the patient's care in the ED.  Some ED evaluations and interventions may be delayed as a result of limited staffing during the pandemic.*     Medical Decision Making:  86 yo F here for re-check of Hgb after downtrending Hgb during recent hospitalization. Son present at bedside, confirms pt is at mental baseline. Clinically, she is non-toxic and HDS. No hypotension. Hgb is 8.3, stable from 8.2 on d/c on 10/23. No vaginal or other bleeding. She has some healing bruising noted diffusely which I suspect is related to her fall, with additional component of dilutional anemia from fluids for tx of rhabdo. Given stable Hgb >8, do not feel she merits transfusion at this time. Will have her start iron supplement and f/u as outpt. Otherwise, pt incidentally c/o urinary frequency and has UA c/f UTI. No fever, vomiting, confusion, flank pain, or signs to suggest pyelo or sepsis. ABX started and UCx sent. D/c back to facility.  ____________________________________________  FINAL CLINICAL IMPRESSION(S) / ED DIAGNOSES  Final diagnoses:  Anemia, unspecified type  Acute cystitis with hematuria     MEDICATIONS GIVEN DURING THIS  VISIT:  Medications  cephALEXin (KEFLEX) capsule 500 mg (has no administration in time range)     ED Discharge Orders         Ordered    cephALEXin (KEFLEX) 500 MG capsule  3 times daily        03/11/20 1630    ferrous sulfate 325 (65 FE) MG tablet  Daily        03/11/20 1630           Note:  This document was prepared using Dragon voice recognition software and may include unintentional dictation errors.   Duffy Bruce, MD 03/11/20 [phone]

## 2020-03-11 NOTE — ED Triage Notes (Signed)
Patient to ER from [facility] for c/o abnormal labs on 10/23 (Hgb 8.2/Hcg 25.5). Sent by primary MD. Last Hgb on 10/22 was 10.3, 10/21: Hgb 11.5.

## 2020-03-11 NOTE — Discharge Instructions (Signed)
Aimee Kirk's Hgb was 8.3 today, stable from her discharge  Suspect this is dilutional and also related to bruising from her recent fall  Will start her on low-dose iron supplement to boost RBC production, as well as an ABX for possible UTI  Recommend repeat lab work in the next 5-7 days

## 2020-03-11 NOTE — ED Notes (Signed)
Patient attempted to urinate and is unable. States that she has urge but cannot go.

## 2020-03-14 DIAGNOSIS — R748 Abnormal levels of other serum enzymes: Secondary | ICD-10-CM

## 2020-03-17 DIAGNOSIS — L89612 Pressure ulcer of right heel, stage 2: Secondary | ICD-10-CM | POA: Diagnosis not present

## 2020-03-17 DIAGNOSIS — R5381 Other malaise: Secondary | ICD-10-CM | POA: Diagnosis not present

## 2020-03-17 DIAGNOSIS — N390 Urinary tract infection, site not specified: Secondary | ICD-10-CM | POA: Diagnosis not present

## 2020-03-17 DIAGNOSIS — E440 Moderate protein-calorie malnutrition: Secondary | ICD-10-CM | POA: Diagnosis not present

## 2020-03-17 DIAGNOSIS — I5030 Unspecified diastolic (congestive) heart failure: Secondary | ICD-10-CM | POA: Diagnosis not present

## 2020-03-17 DIAGNOSIS — D62 Acute posthemorrhagic anemia: Secondary | ICD-10-CM | POA: Diagnosis not present

## 2020-03-17 DIAGNOSIS — L89891 Pressure ulcer of other site, stage 1: Secondary | ICD-10-CM | POA: Diagnosis not present

## 2020-03-17 DIAGNOSIS — R69 Illness, unspecified: Secondary | ICD-10-CM | POA: Diagnosis not present

## 2020-03-17 DIAGNOSIS — D6489 Other specified anemias: Secondary | ICD-10-CM | POA: Diagnosis not present

## 2020-03-17 DIAGNOSIS — R000 Tachycardia, unspecified: Secondary | ICD-10-CM | POA: Diagnosis not present

## 2020-03-19 DIAGNOSIS — D649 Anemia, unspecified: Secondary | ICD-10-CM | POA: Diagnosis not present

## 2020-03-19 DIAGNOSIS — R2681 Unsteadiness on feet: Secondary | ICD-10-CM | POA: Diagnosis not present

## 2020-05-20 DIAGNOSIS — H353211 Exudative age-related macular degeneration, right eye, with active choroidal neovascularization: Secondary | ICD-10-CM | POA: Diagnosis not present

## 2020-05-20 DIAGNOSIS — H353221 Exudative age-related macular degeneration, left eye, with active choroidal neovascularization: Secondary | ICD-10-CM | POA: Diagnosis not present

## 2020-06-16 DIAGNOSIS — R69 Illness, unspecified: Secondary | ICD-10-CM | POA: Diagnosis not present

## 2020-06-16 DIAGNOSIS — U071 COVID-19: Secondary | ICD-10-CM | POA: Diagnosis not present

## 2020-06-16 DIAGNOSIS — R5381 Other malaise: Secondary | ICD-10-CM | POA: Diagnosis not present

## 2020-06-16 DIAGNOSIS — R630 Anorexia: Secondary | ICD-10-CM | POA: Diagnosis not present

## 2020-09-02 DIAGNOSIS — Z961 Presence of intraocular lens: Secondary | ICD-10-CM | POA: Diagnosis not present

## 2020-09-02 DIAGNOSIS — H353211 Exudative age-related macular degeneration, right eye, with active choroidal neovascularization: Secondary | ICD-10-CM | POA: Diagnosis not present

## 2020-09-02 DIAGNOSIS — H353221 Exudative age-related macular degeneration, left eye, with active choroidal neovascularization: Secondary | ICD-10-CM | POA: Diagnosis not present

## 2020-11-25 DIAGNOSIS — H353221 Exudative age-related macular degeneration, left eye, with active choroidal neovascularization: Secondary | ICD-10-CM | POA: Diagnosis not present

## 2020-12-11 DIAGNOSIS — I5032 Chronic diastolic (congestive) heart failure: Secondary | ICD-10-CM | POA: Diagnosis not present

## 2020-12-11 DIAGNOSIS — H3530 Unspecified macular degeneration: Secondary | ICD-10-CM | POA: Diagnosis not present

## 2020-12-11 DIAGNOSIS — R69 Illness, unspecified: Secondary | ICD-10-CM | POA: Diagnosis not present

## 2020-12-11 DIAGNOSIS — R269 Unspecified abnormalities of gait and mobility: Secondary | ICD-10-CM | POA: Diagnosis not present

## 2020-12-21 ENCOUNTER — Encounter: Payer: Self-pay | Admitting: Emergency Medicine

## 2020-12-21 ENCOUNTER — Other Ambulatory Visit: Payer: Self-pay

## 2020-12-21 ENCOUNTER — Inpatient Hospital Stay
Admission: EM | Admit: 2020-12-21 | Discharge: 2020-12-31 | DRG: 521 | Disposition: A | Discharge status: Skilled Nursing Facility | Source: Skilled Nursing Facility | Attending: Internal Medicine | Admitting: Internal Medicine

## 2020-12-21 ENCOUNTER — Emergency Department

## 2020-12-21 DIAGNOSIS — J9811 Atelectasis: Secondary | ICD-10-CM | POA: Diagnosis not present

## 2020-12-21 DIAGNOSIS — W19XXXA Unspecified fall, initial encounter: Secondary | ICD-10-CM | POA: Diagnosis not present

## 2020-12-21 DIAGNOSIS — T148XXA Other injury of unspecified body region, initial encounter: Secondary | ICD-10-CM | POA: Diagnosis present

## 2020-12-21 DIAGNOSIS — R630 Anorexia: Secondary | ICD-10-CM

## 2020-12-21 DIAGNOSIS — R41841 Cognitive communication deficit: Secondary | ICD-10-CM | POA: Diagnosis not present

## 2020-12-21 DIAGNOSIS — Z7982 Long term (current) use of aspirin: Secondary | ICD-10-CM

## 2020-12-21 DIAGNOSIS — R338 Other retention of urine: Secondary | ICD-10-CM | POA: Diagnosis not present

## 2020-12-21 DIAGNOSIS — E871 Hypo-osmolality and hyponatremia: Secondary | ICD-10-CM | POA: Diagnosis not present

## 2020-12-21 DIAGNOSIS — Z4889 Encounter for other specified surgical aftercare: Secondary | ICD-10-CM | POA: Diagnosis not present

## 2020-12-21 DIAGNOSIS — R627 Adult failure to thrive: Secondary | ICD-10-CM | POA: Diagnosis not present

## 2020-12-21 DIAGNOSIS — I5032 Chronic diastolic (congestive) heart failure: Secondary | ICD-10-CM | POA: Diagnosis present

## 2020-12-21 DIAGNOSIS — N390 Urinary tract infection, site not specified: Secondary | ICD-10-CM | POA: Diagnosis not present

## 2020-12-21 DIAGNOSIS — W1830XA Fall on same level, unspecified, initial encounter: Secondary | ICD-10-CM | POA: Diagnosis present

## 2020-12-21 DIAGNOSIS — Z28310 Unvaccinated for covid-19: Secondary | ICD-10-CM | POA: Diagnosis not present

## 2020-12-21 DIAGNOSIS — D72829 Elevated white blood cell count, unspecified: Secondary | ICD-10-CM | POA: Diagnosis not present

## 2020-12-21 DIAGNOSIS — E079 Disorder of thyroid, unspecified: Secondary | ICD-10-CM | POA: Diagnosis present

## 2020-12-21 DIAGNOSIS — K5900 Constipation, unspecified: Secondary | ICD-10-CM | POA: Diagnosis not present

## 2020-12-21 DIAGNOSIS — J690 Pneumonitis due to inhalation of food and vomit: Secondary | ICD-10-CM | POA: Diagnosis not present

## 2020-12-21 DIAGNOSIS — D509 Iron deficiency anemia, unspecified: Secondary | ICD-10-CM | POA: Diagnosis present

## 2020-12-21 DIAGNOSIS — Y92129 Unspecified place in nursing home as the place of occurrence of the external cause: Secondary | ICD-10-CM | POA: Diagnosis not present

## 2020-12-21 DIAGNOSIS — M8580 Other specified disorders of bone density and structure, unspecified site: Secondary | ICD-10-CM | POA: Diagnosis present

## 2020-12-21 DIAGNOSIS — J189 Pneumonia, unspecified organism: Secondary | ICD-10-CM | POA: Diagnosis not present

## 2020-12-21 DIAGNOSIS — Z681 Body mass index (BMI) 19 or less, adult: Secondary | ICD-10-CM | POA: Diagnosis not present

## 2020-12-21 DIAGNOSIS — Z8249 Family history of ischemic heart disease and other diseases of the circulatory system: Secondary | ICD-10-CM

## 2020-12-21 DIAGNOSIS — I252 Old myocardial infarction: Secondary | ICD-10-CM | POA: Diagnosis not present

## 2020-12-21 DIAGNOSIS — Z20822 Contact with and (suspected) exposure to covid-19: Secondary | ICD-10-CM | POA: Diagnosis present

## 2020-12-21 DIAGNOSIS — M6281 Muscle weakness (generalized): Secondary | ICD-10-CM | POA: Diagnosis not present

## 2020-12-21 DIAGNOSIS — I110 Hypertensive heart disease with heart failure: Secondary | ICD-10-CM | POA: Diagnosis present

## 2020-12-21 DIAGNOSIS — E860 Dehydration: Secondary | ICD-10-CM | POA: Diagnosis not present

## 2020-12-21 DIAGNOSIS — Z66 Do not resuscitate: Secondary | ICD-10-CM | POA: Diagnosis present

## 2020-12-21 DIAGNOSIS — D62 Acute posthemorrhagic anemia: Secondary | ICD-10-CM | POA: Diagnosis not present

## 2020-12-21 DIAGNOSIS — E538 Deficiency of other specified B group vitamins: Secondary | ICD-10-CM | POA: Diagnosis present

## 2020-12-21 DIAGNOSIS — Z8261 Family history of arthritis: Secondary | ICD-10-CM

## 2020-12-21 DIAGNOSIS — M47816 Spondylosis without myelopathy or radiculopathy, lumbar region: Secondary | ICD-10-CM | POA: Diagnosis not present

## 2020-12-21 DIAGNOSIS — Z96649 Presence of unspecified artificial hip joint: Secondary | ICD-10-CM

## 2020-12-21 DIAGNOSIS — I517 Cardiomegaly: Secondary | ICD-10-CM | POA: Diagnosis not present

## 2020-12-21 DIAGNOSIS — R9431 Abnormal electrocardiogram [ECG] [EKG]: Secondary | ICD-10-CM | POA: Diagnosis not present

## 2020-12-21 DIAGNOSIS — R262 Difficulty in walking, not elsewhere classified: Secondary | ICD-10-CM | POA: Diagnosis not present

## 2020-12-21 DIAGNOSIS — Z4789 Encounter for other orthopedic aftercare: Secondary | ICD-10-CM | POA: Diagnosis not present

## 2020-12-21 DIAGNOSIS — R000 Tachycardia, unspecified: Secondary | ICD-10-CM | POA: Diagnosis not present

## 2020-12-21 DIAGNOSIS — S72002A Fracture of unspecified part of neck of left femur, initial encounter for closed fracture: Secondary | ICD-10-CM | POA: Diagnosis not present

## 2020-12-21 DIAGNOSIS — E43 Unspecified severe protein-calorie malnutrition: Secondary | ICD-10-CM | POA: Diagnosis present

## 2020-12-21 DIAGNOSIS — I10 Essential (primary) hypertension: Secondary | ICD-10-CM | POA: Diagnosis present

## 2020-12-21 DIAGNOSIS — Z419 Encounter for procedure for purposes other than remedying health state, unspecified: Secondary | ICD-10-CM

## 2020-12-21 DIAGNOSIS — S72032A Displaced midcervical fracture of left femur, initial encounter for closed fracture: Secondary | ICD-10-CM | POA: Diagnosis not present

## 2020-12-21 DIAGNOSIS — S72042A Displaced fracture of base of neck of left femur, initial encounter for closed fracture: Secondary | ICD-10-CM | POA: Diagnosis not present

## 2020-12-21 DIAGNOSIS — Z96642 Presence of left artificial hip joint: Secondary | ICD-10-CM | POA: Diagnosis not present

## 2020-12-21 DIAGNOSIS — Z471 Aftercare following joint replacement surgery: Secondary | ICD-10-CM | POA: Diagnosis not present

## 2020-12-21 DIAGNOSIS — S0003XA Contusion of scalp, initial encounter: Secondary | ICD-10-CM | POA: Diagnosis not present

## 2020-12-21 DIAGNOSIS — R279 Unspecified lack of coordination: Secondary | ICD-10-CM | POA: Diagnosis not present

## 2020-12-21 DIAGNOSIS — M1612 Unilateral primary osteoarthritis, left hip: Secondary | ICD-10-CM | POA: Diagnosis not present

## 2020-12-21 DIAGNOSIS — R4182 Altered mental status, unspecified: Secondary | ICD-10-CM | POA: Diagnosis not present

## 2020-12-21 DIAGNOSIS — E46 Unspecified protein-calorie malnutrition: Secondary | ICD-10-CM

## 2020-12-21 DIAGNOSIS — F0150 Vascular dementia without behavioral disturbance: Secondary | ICD-10-CM | POA: Diagnosis present

## 2020-12-21 DIAGNOSIS — R1310 Dysphagia, unspecified: Secondary | ICD-10-CM | POA: Diagnosis not present

## 2020-12-21 DIAGNOSIS — Z79899 Other long term (current) drug therapy: Secondary | ICD-10-CM

## 2020-12-21 DIAGNOSIS — Z01811 Encounter for preprocedural respiratory examination: Secondary | ICD-10-CM

## 2020-12-21 DIAGNOSIS — R5381 Other malaise: Secondary | ICD-10-CM | POA: Diagnosis not present

## 2020-12-21 DIAGNOSIS — S72002D Fracture of unspecified part of neck of left femur, subsequent encounter for closed fracture with routine healing: Secondary | ICD-10-CM | POA: Diagnosis not present

## 2020-12-21 LAB — CBC WITH DIFFERENTIAL/PLATELET
Abs Immature Granulocytes: 0.08 10*3/uL — ABNORMAL HIGH (ref 0.00–0.07)
Basophils Absolute: 0 10*3/uL (ref 0.0–0.1)
Basophils Relative: 0 %
Eosinophils Absolute: 0 10*3/uL (ref 0.0–0.5)
Eosinophils Relative: 0 %
HCT: 35.9 % — ABNORMAL LOW (ref 36.0–46.0)
Hemoglobin: 12.2 g/dL (ref 12.0–15.0)
Immature Granulocytes: 0 %
Lymphocytes Relative: 3 %
Lymphs Abs: 0.7 10*3/uL (ref 0.7–4.0)
MCH: 31.3 pg (ref 26.0–34.0)
MCHC: 34 g/dL (ref 30.0–36.0)
MCV: 92.1 fL (ref 80.0–100.0)
Monocytes Absolute: 0.9 10*3/uL (ref 0.1–1.0)
Monocytes Relative: 4 %
Neutro Abs: 19.5 10*3/uL — ABNORMAL HIGH (ref 1.7–7.7)
Neutrophils Relative %: 93 %
Platelets: 275 10*3/uL (ref 150–400)
RBC: 3.9 MIL/uL (ref 3.87–5.11)
RDW: 13.2 % (ref 11.5–15.5)
WBC: 21.2 10*3/uL — ABNORMAL HIGH (ref 4.0–10.5)
nRBC: 0 % (ref 0.0–0.2)

## 2020-12-21 LAB — RESP PANEL BY RT-PCR (FLU A&B, COVID) ARPGX2
Influenza A by PCR: NEGATIVE
Influenza B by PCR: NEGATIVE
SARS Coronavirus 2 by RT PCR: NEGATIVE

## 2020-12-21 LAB — TROPONIN I (HIGH SENSITIVITY)
Troponin I (High Sensitivity): 13 ng/L (ref <18)
Troponin I (High Sensitivity): 13 ng/L (ref <18)

## 2020-12-21 LAB — COMPREHENSIVE METABOLIC PANEL
ALT: 23 U/L (ref 0–44)
AST: 36 U/L (ref 15–41)
Albumin: 4.1 g/dL (ref 3.5–5.0)
Alkaline Phosphatase: 108 U/L (ref 38–126)
Anion gap: 7 (ref 5–15)
BUN: 19 mg/dL (ref 8–23)
CO2: 27 mmol/L (ref 22–32)
Calcium: 9 mg/dL (ref 8.9–10.3)
Chloride: 100 mmol/L (ref 98–111)
Creatinine, Ser: 0.93 mg/dL (ref 0.44–1.00)
GFR, Estimated: 56 mL/min — ABNORMAL LOW (ref >60)
Glucose, Bld: 143 mg/dL — ABNORMAL HIGH (ref 70–99)
Potassium: 4.3 mmol/L (ref 3.5–5.1)
Sodium: 134 mmol/L — ABNORMAL LOW (ref 135–145)
Total Bilirubin: 1.2 mg/dL (ref 0.3–1.2)
Total Protein: 6.9 g/dL (ref 6.5–8.1)

## 2020-12-21 LAB — BRAIN NATRIURETIC PEPTIDE: B Natriuretic Peptide: 432.9 pg/mL — ABNORMAL HIGH (ref 0.0–100.0)

## 2020-12-21 LAB — MAGNESIUM: Magnesium: 1.9 mg/dL (ref 1.7–2.4)

## 2020-12-21 MED ORDER — CEFAZOLIN SODIUM-DEXTROSE 2-4 GM/100ML-% IV SOLN
2.0000 g | INTRAVENOUS | Status: AC
  Administered 2020-12-22: 2 g via INTRAVENOUS
  Filled 2020-12-21: qty 100

## 2020-12-21 MED ORDER — METOPROLOL TARTRATE 50 MG PO TABS
50.0000 mg | ORAL_TABLET | Freq: Two times a day (BID) | ORAL | Status: DC
  Administered 2020-12-23 – 2020-12-31 (×14): 50 mg via ORAL
  Filled 2020-12-21 (×17): qty 1

## 2020-12-21 MED ORDER — FENTANYL CITRATE (PF) 100 MCG/2ML IJ SOLN
12.5000 ug | INTRAMUSCULAR | Status: DC | PRN
  Administered 2020-12-21 (×2): 12.5 ug via INTRAVENOUS
  Filled 2020-12-21 (×2): qty 2

## 2020-12-21 MED ORDER — ACETAMINOPHEN 325 MG PO TABS
650.0000 mg | ORAL_TABLET | Freq: Four times a day (QID) | ORAL | Status: DC | PRN
Start: 2020-12-21 — End: 2020-12-22

## 2020-12-21 MED ORDER — DOCUSATE SODIUM 100 MG PO CAPS: 100.0000 mg | ORAL_CAPSULE | Freq: Two times a day (BID) | ORAL | Status: DC

## 2020-12-21 MED ORDER — ACETAMINOPHEN 650 MG RE SUPP: 650.0000 mg | Freq: Four times a day (QID) | RECTAL | Status: DC | PRN

## 2020-12-21 MED ORDER — DOCUSATE SODIUM 100 MG PO CAPS
100.0000 mg | ORAL_CAPSULE | Freq: Once | ORAL | Status: AC
  Administered 2020-12-21: 100 mg via ORAL
  Filled 2020-12-21: qty 1

## 2020-12-21 NOTE — ED Notes (Signed)
Pt moved to room, 13, is hungry will offer food.

## 2020-12-21 NOTE — H&P (Signed)
History and Physical    PLEASE NOTE THAT DRAGON DICTATION SOFTWARE WAS USED IN THE CONSTRUCTION OF THIS NOTE.   Aimee Kirk [id number] DOB: Jun 11, 1922 DOA: 12/21/2020  PCP: Orvis Brill, [facility] Patient coming from: ALF  I have personally briefly reviewed patient's old medical records in [place]  Chief Complaint: Fall  HPI: Aimee Kirk is a 87 y.o. female with medical history significant for advanced dementia, chronic diastolic heart failure, moderate mitral regurgitation, who is admitted to [facility] on 12/21/2020 with acute left femoral neck fracture after presenting from ALF to [facility] ED for evaluation of fall.  In the context of the patient's advanced dementia, the following history is obtained via my discussions with the patient's son (POA), who is present at bedside in addition to my discussions with the EDP and via chart review.  As conveyed to the patient's son by the assisted living facility staff, the patient reportedly experienced a ground-level fall earlier today, which was witnessed, either by the assisted living facility staff or by another resident at the assisted living facility.  Per these reports, the patient was bending over to tie her shoe, when she fell forward, landing on her left side, including left hip, as the principal point of contact with the floor.  It is unclear from his reports that patient hit her head as a component of this fall, but there was no reported associated loss of consciousness.  Upon falling to the floor, the patient reportedly developed immediate left-sided hip pain, and she was subsequently brought to [facility] ED for further evaluation and management of her left hip pain in the context of the above fall.  At the time of my encounter with her, the patient is without complaint, and denies any current left hip pain or other arthralgias/myalgias.  Denies any current chest pain, shortness of breath, nausea,  vomiting, or headache.  No recent diarrhea reported, nor any known recent COVID-19 exposures.  Per my discussions with the patient's son, at baseline, the patient is able to transfer without assistance and typically uses a front wheel walker for ambulation.  In the setting of her advanced dementia, she has been living in a local assisted living facility for the last few years, with the son reporting that she had just been moved to a new assisted living facility yesterday.   In October 2021, patient was admitted to [facility] for NSTEMI for which conservative, medical management was pursued, with associated echocardiogram performed during that hospitalization demonstrating the following: LVEF 50 to 55%, regional wall motion normalities involving the distal anterior and anterior septal wall as well as apical region, mild LVH, grade 1 diastolic dysfunction, mild to moderately dilated left atrium, moderate mitral regurgitation, moderate to severe tricuspid regurgitation.  In this context, she is currently on metoprolol tartrate as well as a daily baby aspirin, with most recent dose taken this morning.  Otherwise, she is reportedly on no blood thinners as an outpatient.  She is also on no scheduled diuretic medications as an outpatient.  From discussions with the patient's son, the patient is on hospice with [facility], although the family is considering considering changing to a different hospice provider that works more closely with her new assisted living facility.  Son confirms that the patient is DNR.   Son conveys that he would like to engage in additional discussions with surgeon regarding surgical versus nonsurgical options for management of the patient's acute left hip fracture, but conveys that, as  the POA, that he would lean towards definitive surgical intervention for optimization of quality of life given the patient's high degree of baseline ambulatory abilities leading up to this fracture.       ED Course:  Vital signs in the ED were notable for the following:  Temperature max 98, heart rate 86, blood pressure 97/52-120/74; respiratory rate 16; oxygen saturation 95% on room air.  Labs were notable for the following: CMP was notable for the following: Sodium 134, potassium 4.3, bicarbonate 27, BUN 19, creatinine 0.93, and liver enzymes were found to be within normal limits.  BNP 433, with no prior BNP data point available for point comparison.  High-sensitivity troponin I 13.  Urinalysis has been ordered, with result currently pending.  CBC notable for white cell count of 21,000, hemoglobin 12.2, platelets 275.  Screening nasopharyngeal COVID-19 PCR was performed in the ED today and found to be negative.  Levels of the left hip showed acute fracture of the left femoral neck.  Noncontrast CT head showed no evidence of acute intracranial process, including no evidence of intracranial hemorrhage or acute infarct.  Chest x-ray, in comparison to most recent prior CXR from October 2021, showed stable cardiomegaly without evidence of acute cardiopulmonary process, including no evidence of infiltrate, edema, effusion, or pneumothorax.  The EDP discussed the patient's case with the on-call orthopedic surgeon, Dr.Poggi, Who recommended admission to the hospital service for further evaluation and management of presenting acute left hip fracture.  Dr.Poggi will further assess the patient in the morning (12/22/20) with plan for additional discussions with the patient's family regarding options for surgical versus nonsurgical intervention.  In leaving open all potential interventions at this time, Dr.Poggi requested that the patient be kept n.p.o. after midnight.     Review of Systems: As per HPI otherwise 10 point review of systems negative.   Past Medical History:  Diagnosis Date   Dementia ([place])    Thyroid disease     Past Surgical History:  Procedure Laterality Date   ABDOMINAL  HYSTERECTOMY      Social History:  reports that she has never smoked. She has never used smokeless tobacco. She reports that she does not drink alcohol and does not use drugs.   Allergies  Allergen Reactions   Remdesivir     Pt son states he absolutely does not want his mother to receive this medication     Family History  Problem Relation Age of Onset   Arthritis Mother    Heart disease Father     Family history reviewed and not pertinent   Prior to Admission medications   Medication Sig Start Date End Date Taking? Authorizing Provider  aspirin 81 MG chewable tablet Chew 1 tablet (81 mg total) by mouth daily. 03/09/20   Fritzi Mandes, MD  ferrous sulfate 325 (65 FE) MG tablet Take 1 tablet (325 mg total) by mouth daily for 14 days. 03/11/20 03/25/20  Duffy Bruce, MD  metoprolol tartrate (LOPRESSOR) 50 MG tablet Take 1 tablet (50 mg total) by mouth 2 (two) times daily. 03/08/20   Fritzi Mandes, MD     Objective    Physical Exam: Vitals:   12/21/20 1600 12/21/20 1602  BP:  (!) 97/52  Pulse:  86  Resp:  16  Temp:  98 F (36.7 C)  TempSrc:  Oral  SpO2:  95%  Weight: 45.4 kg   Height: '5\' 2"'$  (1.575 m)     General: appears to be stated age; alert, but not  oriented to person, place, time Skin: warm, dry, no rash Head:  AT/Ava Mouth:  Oral mucosa membranes appear moist, normal dentition Neck: supple; trachea midline Heart:  RRR; did not appreciate any M/R/G Lungs: CTAB, did not appreciate any wheezes, rales, or rhonchi Abdomen: + BS; soft, ND, NT Vascular: 2+ pedal pulses b/l; 2+ radial pulses b/l Extremities: no peripheral edema, left hip noted to be externally rotated and LLE shorter in appearance relative to right lower extremity Neuro: The setting of the patient's advanced dementia, and associated limitations in following instructions, unable to perform full neurologic assessment at this time, including limited assessment of strength, sensation, and cranial nerve  evaluation.  No tremors.    Labs on Admission: I have personally reviewed following labs and imaging studies  CBC: Recent Labs  Lab 12/21/20 1735  WBC 21.2*  NEUTROABS 19.5*  HGB 12.2  HCT 35.9*  MCV 92.1  PLT 123XX123   Basic Metabolic Panel: Recent Labs  Lab 12/21/20 1735 12/21/20 1935  NA 134*  --   K 4.3  --   CL 100  --   CO2 27  --   GLUCOSE 143*  --   BUN 19  --   CREATININE 0.93  --   CALCIUM 9.0  --   MG  --  1.9   GFR: Estimated Creatinine Clearance: 25.4 mL/min (by C-G formula based on SCr of 0.93 mg/dL). Liver Function Tests: Recent Labs  Lab 12/21/20 1735  AST 36  ALT 23  ALKPHOS 108  BILITOT 1.2  PROT 6.9  ALBUMIN 4.1   No results for input(s): LIPASE, AMYLASE in the last 168 hours. No results for input(s): AMMONIA in the last 168 hours. Coagulation Profile: No results for input(s): INR, PROTIME in the last 168 hours. Cardiac Enzymes: No results for input(s): CKTOTAL, CKMB, CKMBINDEX, TROPONINI in the last 168 hours. BNP (last 3 results) No results for input(s): PROBNP in the last 8760 hours. HbA1C: No results for input(s): HGBA1C in the last 72 hours. CBG: No results for input(s): GLUCAP in the last 168 hours. Lipid Profile: No results for input(s): CHOL, HDL, LDLCALC, TRIG, CHOLHDL, LDLDIRECT in the last 72 hours. Thyroid Function Tests: No results for input(s): TSH, T4TOTAL, FREET4, T3FREE, THYROIDAB in the last 72 hours. Anemia Panel: No results for input(s): VITAMINB12, FOLATE, FERRITIN, TIBC, IRON, RETICCTPCT in the last 72 hours. Urine analysis:    Component Value Date/Time   COLORURINE AMBER (A) 03/11/2020 1316   APPEARANCEUR TURBID (A) 03/11/2020 1316   LABSPEC 1.025 03/11/2020 1316   PHURINE 5.0 03/11/2020 1316   GLUCOSEU NEGATIVE 03/11/2020 1316   HGBUR SMALL (A) 03/11/2020 1316   BILIRUBINUR NEGATIVE 03/11/2020 1316   KETONESUR NEGATIVE 03/11/2020 1316   PROTEINUR 30 (A) 03/11/2020 1316   NITRITE NEGATIVE 03/11/2020 1316    LEUKOCYTESUR MODERATE (A) 03/11/2020 1316    Radiological Exams on Admission: DG Chest 1 View  Result Date: 12/21/2020 CLINICAL DATA:  Pain after falling. EXAM: CHEST  1 VIEW COMPARISON:  03/05/2020 FINDINGS: Heart is enlarged and stable in configuration. Stable elevation of the LEFT hemidiaphragm. Lungs are clear. No pneumothorax. No acute displaced fractures. Chronic changes in both shoulders, RIGHT greater than LEFT. IMPRESSION: Stable cardiomegaly.  No evidence for acute  abnormality. Electronically Signed   By: Nolon Nations M.D.   On: 12/21/2020 16:36   CT HEAD WO CONTRAST (5MM)  Result Date: 12/21/2020 CLINICAL DATA:  Mental status change. EXAM: CT HEAD WITHOUT CONTRAST TECHNIQUE: Contiguous axial images were obtained  from the base of the skull through the vertex without intravenous contrast. COMPARISON:  03/05/2020 FINDINGS: Brain: Stable age related cerebral atrophy, ventriculomegaly and periventricular white matter disease. No extra-axial fluid collections are identified. No CT findings for acute hemispheric infarction or intracranial hemorrhage. No mass lesions. The brainstem and cerebellum are normal. Vascular: Stable vascular calcifications. No aneurysm or hyperdense vessels. Skull: No skull fracture or bone lesions. Sinuses/Orbits: Paranasal sinuses and mastoid air cells are clear. The globes are intact. Other: No scalp lesions or scalp hematoma. IMPRESSION: 1. Stable age related cerebral atrophy, ventriculomegaly and periventricular white matter disease. 2. No acute intracranial findings or mass lesions. Electronically Signed   By: Marijo Sanes M.D.   On: 12/21/2020 16:37   DG Hip Unilat With Pelvis 2-3 Views Left  Result Date: 12/21/2020 CLINICAL DATA:  Pain after falling. EXAM: DG HIP (WITH OR WITHOUT PELVIS) 2-3V LEFT COMPARISON:  03/05/2020 FINDINGS: There is acute fracture of the LEFT femoral neck, associated with foreshortening and varus angulation. There is no evidence for  dislocation or subluxation of the femoral head. The limited view is somewhat limited evaluation of location of the femoral head. Remainder of the pelvis is intact. Degenerative changes are seen in the LOWER lumbar spine and both hips. Significant stool burden incidentally noted. IMPRESSION: Acute fracture of the LEFT femoral neck. Limited evaluation for dislocation without evidence for dislocation. Significant stool burden. Electronically Signed   By: Nolon Nations M.D.   On: 12/21/2020 16:35      Assessment/Plan   Aimee Kirk is a 87 y.o. female with medical history significant for advanced dementia, chronic diastolic heart failure, moderate mitral regurgitation, who is admitted to [place] on 12/21/2020 with acute left femoral neck fracture after presenting from ALF to [facility] ED for evaluation of fall.   Principal Problem:   Closed left hip fracture [facility]) Active Problems:   Fall   Protein calorie malnutrition (HCC)   Leukocytosis   Chronic diastolic CHF (congestive heart failure) (HCC)   Essential hypertension     #) Acute left femoral neck fracture: confirmed via presenting plain films and stemming from ground level mechanical fall without associated loss of consciousness that occurred earlier on the day of admission, as further described above, resulting in immediate develop of acute left hip pain.  The patient's case/imaging were discussed with the on-call orthopedic surgeon, Dr. Roland Rack,  who recommended admission to the hospitalist service for further evaluation and management of acute left hip fracture, including preoperative medical optimization.  Dr.Poggi to further assess the patient in the morning with plan for additional discussions with the patient's family regarding surgical versus nonsurgical option as management of her acute left hip fx. In leaving open all potential interventions at this time, Dr.Poggi requested that the patient be kept n.p.o. after  midnight.  Of note, pending these discussions with the orthopedic surgeon, the patient's son (POA) conveys that he is currently leaning towards surgical intervention for optimization of quality of life given the patient's baseline ambulatory status, as above.  At this time, the left lower extremity appears neurovascularly intact, the patient reports adequate pain control at this time.  She is on a daily baby aspirin as an outpatient, with with most recent dose on the morning of 12/21/2020.  Otherwise, not on any blood thinners as an outpatient.  Lyndel Safe Score for this patient in the context of anticipated aforementioned orthopedic surgery conveys a 3.07% perioperative risk for significant cardiac event. Patient has mildly elevated BNP,  without prior data point available for point comparison.  Suspect a degree of elevation of BNP as a consequence of the patient's body habitus, given that BNP is metabolized and peripheral tissue, with presenting BMI noted to be 18.  Furthermore, no clinical evidence to suggest acutely decompensated heart failure at this time, including no evidence of infiltrate, edema, effusion, presenting chest x-ray, the patient appears to be in no acute respiratory distress, maintaining oxygen saturations in the high 90s on room air. Overall, no evidence to suggest acutely decompensated heart failure or acute MI. Consequently, no absolute contraindications to proceeding with proposed orthopedic surgery at this time.   Plan: Formal orthopedic surgery consult, including discussion of options for surgical versus nonsurgical intervention as above .  Given potential for pursuing surgery, patient is n.p.o. after midnight, per request of orthopedic surgery, as above.  In this context, will refrain from pharmacologic anticoagulation, and hold home daily baby aspirin for now.  SCDs.  As needed IV fentanyl.  Check INR.  Preoperative EKG has been ordered.  Check 25-hydroxy vitamin D level.  Fall  precautions ordered. Scheduled colace ordered.      #) Ground level mechanical fall: Per the above reports, it appears that the patient experienced a ground level mechanical fall earlier today, without associated loss of consciousness, which, per this report, appears to be purely mechanical in nature.  Unclear if the patient hit her head as a component of this fall, although presenting CT head showed no evidence of acute intracranial process, including no evidence of intracranial hemorrhage. While this fall appears to be purely mechanical in nature, will also check urinalysis to evaluate for any underlying infectious contribution.  Plan: Check urinalysis, as above.  Repeat BMP and CBC with differential in the morning. Fall precautions.      #) Leukocytosis: Presenting CBC reflects mildly elevated white cell count. Suspect that this is reactive in nature in the setting of presenting ground-level mechanical fall as well as associated acute hip fracture.  No evidence to suggest underlying infectious process at this time, including negative nasopharyngeal COVID-19 PCR performed in the ED today, as well as chest x-ray showing no evidence of acute cardiopulmonary process, including no evidence of infiltrate.  Will check urinalysis to further evaluate for any underlying infectious contribution.  Plan: Check urinalysis, as above.  Repeat CBC with differential in the morning.       #) Protein calorie malnutrition: Suspected in the setting of presenting BMI 18. Will check prealbumin to further quantify and assess for degree of severity. Will also place nutritional consult for assistance with optimizing patient's nutritional status, including recommendations for revision of nutritional supplements, in part to assist with healing/recovery process relating to her presenting acute left hip fracture.   Plan: Check prealbumin level.  Nutrition consult placed, as above.      #) Chronic diastolic heart  failure: documented history of such, with most recent echocardiogram performed in October 2021 notable for grade 1 diastolic dysfunction, with additional results as detailed above. Etiology of patient's chronic diastolic heart failure includes hypertensive consequences given a documented history of such as well as ischemia in the presence of wall motion normalities identified on this echocardiogram. No clinical evidence to suggest acutely decompensated heart failure at this time, as further discussed above.  Not on any scheduled diuretic medications as an outpatient.   Plan: monitor strict I's & O's and daily weights. Repeat BMP in the morning. Check serum magnesium level.  Preoperative EKG has been ordered,  as above.     DVT prophylaxis: scd's  Code Status: DNR Family Communication: Patient's case was discussed with her son (POA), who was present at bedside Disposition Plan: Per Rounding Team Consults called: case discussed with on-call orthopedic surgeon, Dr. Roland Rack, as further detailed above;  Admission status: Inpatient, MedSurg     Of note, this patient was added by me to the following Admit List/Treatment Team: [facility].      PLEASE NOTE THAT DRAGON DICTATION SOFTWARE WAS USED IN THE CONSTRUCTION OF THIS NOTE.   [place] [facility] Pager [phone] From [place]  Otherwise, please contact night-coverage  [email or web address] Password [email or web address]   12/21/2020, 8:19 PM

## 2020-12-21 NOTE — ED Provider Notes (Signed)
[facility] Emergency Department Provider Note   ____________________________________________   Event Date/Time   First MD Initiated Contact with Patient 12/21/20 1941     (approximate)  I have reviewed the triage vital signs and the nursing notes.   HISTORY  Chief Complaint Fall    HPI Aimee Kirk is a 87 y.o. female who presents via EMS from her assisted living facility after an unwitnessed fall just prior to arrival.  Patient does have history of severe dementia and cannot participate in history or review of systems.  Per EMS report from assisted living staff, patient was found down and placed back in her bed before complaining of left arm and left leg pain.  Son also at bedside and states that her speech sounds somewhat abnormal to him.  He does state that she has some difficulty with speech at baseline but this has worsened and is unclear as to when this change occurred.          Past Medical History:  Diagnosis Date   Dementia [facility])    Thyroid disease     Patient Active Problem List   Diagnosis Date Noted   Closed left hip fracture ([place]) 12/21/2020   Abnormal CK    NSTEMI (non-ST elevated myocardial infarction) [facility])    Traumatic rhabdomyolysis ([place])    ACS (acute coronary syndrome) ([place]) 03/05/2020   Sepsis without acute organ dysfunction ([place])    Fall    Positive D dimer    Lactic acidemia    Dementia without behavioral disturbance ([place])     Past Surgical History:  Procedure Laterality Date   ABDOMINAL HYSTERECTOMY      Prior to Admission medications   Medication Sig Start Date End Date Taking? Authorizing Provider  aspirin 81 MG chewable tablet Chew 1 tablet (81 mg total) by mouth daily. 03/09/20   Fritzi Mandes, MD  ferrous sulfate 325 (65 FE) MG tablet Take 1 tablet (325 mg total) by mouth daily for 14 days. 03/11/20 03/25/20  Duffy Bruce, MD  metoprolol tartrate (LOPRESSOR) 50 MG tablet Take 1 tablet (50 mg total) by  mouth 2 (two) times daily. 03/08/20   Fritzi Mandes, MD    Allergies Remdesivir  Family History  Problem Relation Age of Onset   Arthritis Mother    Heart disease Father     Social History Social History   Tobacco Use   Smoking status: Never   Smokeless tobacco: Never  Vaping Use   Vaping Use: Never used  Substance Use Topics   Alcohol use: Never   Drug use: Never    Review of Systems Unable to assess secondary to mental status   ____________________________________________   PHYSICAL EXAM:  VITAL SIGNS: ED Triage Vitals  Enc Vitals Group     BP 12/21/20 1602 (!) 97/52     Pulse Rate 12/21/20 1602 86     Resp 12/21/20 1602 16     Temp 12/21/20 1602 98 F (36.7 C)     Temp Source 12/21/20 1602 Oral     SpO2 12/21/20 1602 95 %     Weight 12/21/20 1600 100 lb (45.4 kg)     Height 12/21/20 1600 '5\' 2"'$  (1.575 m)     Head Circumference --      Peak Flow --      Pain Score --      Pain Loc --      Pain Edu? --      Excl. in [place]? --  Constitutional: Alert and disoriented. Well appearing elderly Caucasian female in no acute distress. Eyes: Conjunctivae are normal. PERRL. Head: Atraumatic. Nose: No congestion/rhinnorhea. Mouth/Throat: Mucous membranes are moist. Neck: No stridor Cardiovascular: Grossly normal heart sounds.  Good peripheral circulation. Respiratory: Normal respiratory effort.  No retractions. Gastrointestinal: Soft and nontender. No distention. Musculoskeletal: Left lower extremity mildly shortened and externally rotated with significant tenderness to palpation over the left hip as well as pain with range of motion Neurologic:  Normal speech and language. No gross focal neurologic deficits are appreciated. Skin:  Skin is warm and dry. No rash noted. Psychiatric: Mood and affect are normal. Speech and behavior are normal.  ____________________________________________   LABS (all labs ordered are listed, but only abnormal results are  displayed)  Labs Reviewed  COMPREHENSIVE METABOLIC PANEL - Abnormal; Notable for the following components:      Result Value   Sodium 134 (*)    Glucose, Bld 143 (*)    GFR, Estimated 56 (*)    All other components within normal limits  CBC WITH DIFFERENTIAL/PLATELET - Abnormal; Notable for the following components:   WBC 21.2 (*)    HCT 35.9 (*)    Neutro Abs 19.5 (*)    Abs Immature Granulocytes 0.08 (*)    All other components within normal limits  BRAIN NATRIURETIC PEPTIDE - Abnormal; Notable for the following components:   B Natriuretic Peptide 432.9 (*)    All other components within normal limits  RESP PANEL BY RT-PCR (FLU A&B, COVID) ARPGX2  URINALYSIS, COMPLETE (UACMP) WITH MICROSCOPIC  MAGNESIUM  MAGNESIUM  CBC WITH DIFFERENTIAL/PLATELET  COMPREHENSIVE METABOLIC PANEL  PROTIME-INR  TROPONIN I (HIGH SENSITIVITY)  TROPONIN I (HIGH SENSITIVITY)    RADIOLOGY  ED MD interpretation: 1 view chest x-ray shows no evidence of acute abnormalities including no pneumonia, pneumothorax, or widened mediastinum  CT of the head without contrast shows no evidence of acute abnormalities including no intracerebral hemorrhage, obvious masses, or significant edema  X-ray of the left pelvis shows an acute fracture of the left femoral neck  Official radiology report(s): DG Chest 1 View  Result Date: 12/21/2020 CLINICAL DATA:  Pain after falling. EXAM: CHEST  1 VIEW COMPARISON:  03/05/2020 FINDINGS: Heart is enlarged and stable in configuration. Stable elevation of the LEFT hemidiaphragm. Lungs are clear. No pneumothorax. No acute displaced fractures. Chronic changes in both shoulders, RIGHT greater than LEFT. IMPRESSION: Stable cardiomegaly.  No evidence for acute  abnormality. Electronically Signed   By: Nolon Nations M.D.   On: 12/21/2020 16:36   CT HEAD WO CONTRAST (5MM)  Result Date: 12/21/2020 CLINICAL DATA:  Mental status change. EXAM: CT HEAD WITHOUT CONTRAST TECHNIQUE:  Contiguous axial images were obtained from the base of the skull through the vertex without intravenous contrast. COMPARISON:  03/05/2020 FINDINGS: Brain: Stable age related cerebral atrophy, ventriculomegaly and periventricular white matter disease. No extra-axial fluid collections are identified. No CT findings for acute hemispheric infarction or intracranial hemorrhage. No mass lesions. The brainstem and cerebellum are normal. Vascular: Stable vascular calcifications. No aneurysm or hyperdense vessels. Skull: No skull fracture or bone lesions. Sinuses/Orbits: Paranasal sinuses and mastoid air cells are clear. The globes are intact. Other: No scalp lesions or scalp hematoma. IMPRESSION: 1. Stable age related cerebral atrophy, ventriculomegaly and periventricular white matter disease. 2. No acute intracranial findings or mass lesions. Electronically Signed   By: Marijo Sanes M.D.   On: 12/21/2020 16:37   DG Hip Unilat With Pelvis 2-3 Views Left  Result  Date: 12/21/2020 CLINICAL DATA:  Pain after falling. EXAM: DG HIP (WITH OR WITHOUT PELVIS) 2-3V LEFT COMPARISON:  03/05/2020 FINDINGS: There is acute fracture of the LEFT femoral neck, associated with foreshortening and varus angulation. There is no evidence for dislocation or subluxation of the femoral head. The limited view is somewhat limited evaluation of location of the femoral head. Remainder of the pelvis is intact. Degenerative changes are seen in the LOWER lumbar spine and both hips. Significant stool burden incidentally noted. IMPRESSION: Acute fracture of the LEFT femoral neck. Limited evaluation for dislocation without evidence for dislocation. Significant stool burden. Electronically Signed   By: Nolon Nations M.D.   On: 12/21/2020 16:35    ____________________________________________   PROCEDURES  Procedure(s) performed (including Critical Care):  .1-3 Lead EKG Interpretation  Date/Time: 12/21/2020 7:56 PM Performed by: Naaman Plummer,  MD Authorized by: Naaman Plummer, MD     Interpretation: normal     ECG rate:  85   ECG rate assessment: normal     Rhythm: sinus rhythm     Ectopy: none     Conduction: normal     ____________________________________________   INITIAL IMPRESSION / ASSESSMENT AND PLAN / ED COURSE  As part of my medical decision making, I reviewed the following data within the electronic medical record, if available:  Nursing notes reviewed and incorporated, Labs reviewed, EKG interpreted, Old chart reviewed, Radiograph reviewed and Notes from prior ED visits reviewed and incorporated        Patient is a 87 year old female that presents for left hip pain Workup: XR hip Findings: Left femoral neck fracture without dislocation Consult: [facility], hospitalist  Patient does not currently demonstrate complications of fracture such as compartment syndrome, arterial or nerve injury.  Interventions: analgesia Disposition: Admit      ____________________________________________   FINAL CLINICAL IMPRESSION(S) / ED DIAGNOSES  Final diagnoses:  Left displaced femoral neck fracture [facility])     ED Discharge Orders     None        Note:  This document was prepared using Dragon voice recognition software and may include unintentional dictation errors.    Naaman Plummer, MD 12/21/20 [phone]

## 2020-12-21 NOTE — ED Triage Notes (Signed)
Pt to ED via POV with son who states that pt had a fall this morning at assisted living. Per son, staff does not think that pt hit her head. Pt does have abrasion on the left arm and leg. Pt son also states that pts speech sounds abnormal to him, son states that he noticed it when he got to the facility today. Pt is in new facility so they do not have good baseline of patient so son is unsure when the change in speech occurred. Pt son states that pt does have hx/o dementia but that there seems to be some worsening of her condition. Pt is currently in NAD.

## 2020-12-22 ENCOUNTER — Inpatient Hospital Stay

## 2020-12-22 ENCOUNTER — Encounter
Admission: EM | Disposition: A | Discharge status: Skilled Nursing Facility | Payer: Self-pay | Source: Skilled Nursing Facility | Attending: Internal Medicine

## 2020-12-22 ENCOUNTER — Inpatient Hospital Stay: Admitting: Anesthesiology

## 2020-12-22 DIAGNOSIS — S72002A Fracture of unspecified part of neck of left femur, initial encounter for closed fracture: Secondary | ICD-10-CM | POA: Diagnosis not present

## 2020-12-22 DIAGNOSIS — I5032 Chronic diastolic (congestive) heart failure: Secondary | ICD-10-CM | POA: Diagnosis not present

## 2020-12-22 HISTORY — PX: HIP ARTHROPLASTY: SHX981

## 2020-12-22 LAB — CBC WITH DIFFERENTIAL/PLATELET
Abs Immature Granulocytes: 0.04 10*3/uL (ref 0.00–0.07)
Basophils Absolute: 0 10*3/uL (ref 0.0–0.1)
Basophils Relative: 0 %
Eosinophils Absolute: 0 10*3/uL (ref 0.0–0.5)
Eosinophils Relative: 0 %
HCT: 32.5 % — ABNORMAL LOW (ref 36.0–46.0)
Hemoglobin: 10.8 g/dL — ABNORMAL LOW (ref 12.0–15.0)
Immature Granulocytes: 0 %
Lymphocytes Relative: 8 %
Lymphs Abs: 1 10*3/uL (ref 0.7–4.0)
MCH: 31.2 pg (ref 26.0–34.0)
MCHC: 33.2 g/dL (ref 30.0–36.0)
MCV: 93.9 fL (ref 80.0–100.0)
Monocytes Absolute: 0.9 10*3/uL (ref 0.1–1.0)
Monocytes Relative: 7 %
Neutro Abs: 9.9 10*3/uL — ABNORMAL HIGH (ref 1.7–7.7)
Neutrophils Relative %: 85 %
Platelets: 259 10*3/uL (ref 150–400)
RBC: 3.46 MIL/uL — ABNORMAL LOW (ref 3.87–5.11)
RDW: 13.4 % (ref 11.5–15.5)
WBC: 11.9 10*3/uL — ABNORMAL HIGH (ref 4.0–10.5)
nRBC: 0 % (ref 0.0–0.2)

## 2020-12-22 LAB — COMPREHENSIVE METABOLIC PANEL
ALT: 21 U/L (ref 0–44)
AST: 29 U/L (ref 15–41)
Albumin: 3.5 g/dL (ref 3.5–5.0)
Alkaline Phosphatase: 91 U/L (ref 38–126)
Anion gap: 5 (ref 5–15)
BUN: 18 mg/dL (ref 8–23)
CO2: 28 mmol/L (ref 22–32)
Calcium: 8.5 mg/dL — ABNORMAL LOW (ref 8.9–10.3)
Chloride: 101 mmol/L (ref 98–111)
Creatinine, Ser: 0.82 mg/dL (ref 0.44–1.00)
GFR, Estimated: >60 mL/min (ref >60)
Glucose, Bld: 113 mg/dL — ABNORMAL HIGH (ref 70–99)
Potassium: 4.2 mmol/L (ref 3.5–5.1)
Sodium: 134 mmol/L — ABNORMAL LOW (ref 135–145)
Total Bilirubin: 1.5 mg/dL — ABNORMAL HIGH (ref 0.3–1.2)
Total Protein: 6.1 g/dL — ABNORMAL LOW (ref 6.5–8.1)

## 2020-12-22 LAB — TYPE AND SCREEN
ABO/RH(D): A POS
Antibody Screen: NEGATIVE

## 2020-12-22 LAB — PROTIME-INR
INR: 1 (ref 0.8–1.2)
Prothrombin Time: 13.5 seconds (ref 11.4–15.2)

## 2020-12-22 LAB — MAGNESIUM: Magnesium: 2.3 mg/dL (ref 1.7–2.4)

## 2020-12-22 LAB — PREALBUMIN: Prealbumin: 19.6 mg/dL (ref 18–38)

## 2020-12-22 LAB — VITAMIN D 25 HYDROXY (VIT D DEFICIENCY, FRACTURES): Vit D, 25-Hydroxy: 17.7 ng/mL — ABNORMAL LOW (ref 30–100)

## 2020-12-22 SURGERY — HEMIARTHROPLASTY, HIP, DIRECT ANTERIOR APPROACH, FOR FRACTURE
Anesthesia: Spinal | Site: Hip | Laterality: Left

## 2020-12-22 MED ORDER — SENNOSIDES-DOCUSATE SODIUM 8.6-50 MG PO TABS: 1.0000 | ORAL_TABLET | Freq: Every evening | ORAL | Status: DC | PRN

## 2020-12-22 MED ORDER — EPHEDRINE SULFATE 50 MG/ML IJ SOLN
INTRAMUSCULAR | Status: DC | PRN
  Administered 2020-12-22: 5 mg via INTRAVENOUS

## 2020-12-22 MED ORDER — FLEET ENEMA 7-19 GM/118ML RE ENEM: 1.0000 | ENEMA | Freq: Once | RECTAL | Status: DC | PRN

## 2020-12-22 MED ORDER — SODIUM CHLORIDE 0.9 % IV BOLUS
500.0000 mL | Freq: Once | INTRAVENOUS | Status: AC
  Administered 2020-12-22: 500 mL via INTRAVENOUS

## 2020-12-22 MED ORDER — OXYCODONE HCL 5 MG PO TABS
5.0000 mg | ORAL_TABLET | ORAL | Status: DC | PRN
Start: 2020-12-22 — End: 2020-12-31
  Administered 2020-12-25 – 2020-12-26 (×2): 5 mg via ORAL
  Administered 2020-12-28: 10 mg via ORAL
  Filled 2020-12-22: qty 1
  Filled 2020-12-22: qty 2

## 2020-12-22 MED ORDER — PHENYLEPHRINE HCL (PRESSORS) 10 MG/ML IV SOLN
INTRAVENOUS | Status: DC | PRN
  Administered 2020-12-22 (×2): 100 ug via INTRAVENOUS

## 2020-12-22 MED ORDER — TRAMADOL HCL 50 MG PO TABS
50.0000 mg | ORAL_TABLET | Freq: Four times a day (QID) | ORAL | Status: DC | PRN
  Administered 2020-12-22 – 2020-12-28 (×5): 50 mg via ORAL
  Filled 2020-12-22 (×5): qty 1

## 2020-12-22 MED ORDER — OXYCODONE HCL 5 MG PO TABS
2.5000 mg | ORAL_TABLET | ORAL | Status: DC | PRN
  Administered 2020-12-24: 5 mg via ORAL
  Administered 2020-12-24: 2.5 mg via ORAL
  Administered 2020-12-25: 5 mg via ORAL
  Administered 2020-12-27: 2.5 mg via ORAL
  Administered 2020-12-28 – 2020-12-30 (×4): 5 mg via ORAL
  Filled 2020-12-22 (×9): qty 1

## 2020-12-22 MED ORDER — CHLORHEXIDINE GLUCONATE CLOTH 2 % EX PADS
6.0000 | MEDICATED_PAD | Freq: Every day | CUTANEOUS | Status: DC
  Administered 2020-12-22 – 2020-12-31 (×9): 6 via TOPICAL

## 2020-12-22 MED ORDER — METOCLOPRAMIDE HCL 5 MG/ML IJ SOLN: 5.0000 mg | Freq: Three times a day (TID) | INTRAMUSCULAR | Status: DC | PRN

## 2020-12-22 MED ORDER — HYDROMORPHONE HCL 1 MG/ML IJ SOLN
0.2000 mg | INTRAMUSCULAR | Status: DC | PRN
  Administered 2020-12-23: 0.4 mg via INTRAVENOUS
  Filled 2020-12-22 (×2): qty 1

## 2020-12-22 MED ORDER — NEOMYCIN-POLYMYXIN B GU 40-200000 IR SOLN
Status: AC
  Filled 2020-12-22: qty 20

## 2020-12-22 MED ORDER — DOCUSATE SODIUM 100 MG PO CAPS
100.0000 mg | ORAL_CAPSULE | Freq: Two times a day (BID) | ORAL | Status: DC
  Administered 2020-12-22 – 2020-12-29 (×11): 100 mg via ORAL
  Filled 2020-12-22 (×14): qty 1

## 2020-12-22 MED ORDER — PROPOFOL 10 MG/ML IV BOLUS
INTRAVENOUS | Status: DC | PRN
  Administered 2020-12-22: 30 mg via INTRAVENOUS
  Administered 2020-12-22: 20 mg via INTRAVENOUS

## 2020-12-22 MED ORDER — ONDANSETRON HCL 4 MG/2ML IJ SOLN: 4.0000 mg | Freq: Four times a day (QID) | INTRAMUSCULAR | Status: DC | PRN

## 2020-12-22 MED ORDER — MENTHOL 3 MG MT LOZG
1.0000 | LOZENGE | OROMUCOSAL | Status: DC | PRN
  Filled 2020-12-22: qty 9

## 2020-12-22 MED ORDER — ACETAMINOPHEN 500 MG PO TABS
1000.0000 mg | ORAL_TABLET | Freq: Three times a day (TID) | ORAL | Status: AC
  Administered 2020-12-22 – 2020-12-23 (×4): 1000 mg via ORAL
  Filled 2020-12-22 (×4): qty 2

## 2020-12-22 MED ORDER — BUPIVACAINE LIPOSOME 1.3 % IJ SUSP
INTRAMUSCULAR | Status: DC | PRN
  Administered 2020-12-22: 50 mL

## 2020-12-22 MED ORDER — SODIUM CHLORIDE 0.9 % IR SOLN
Status: DC | PRN
  Administered 2020-12-22: 100 mL

## 2020-12-22 MED ORDER — SODIUM CHLORIDE 0.9 % IR SOLN: Status: AC | PRN

## 2020-12-22 MED ORDER — PHENYLEPHRINE HCL (PRESSORS) 10 MG/ML IV SOLN
INTRAVENOUS | Status: DC | PRN
  Administered 2020-12-22: 1 mL

## 2020-12-22 MED ORDER — ENSURE ENLIVE PO LIQD
237.0000 mL | Freq: Two times a day (BID) | ORAL | Status: DC
  Filled 2020-12-22 (×3): qty 237

## 2020-12-22 MED ORDER — SODIUM CHLORIDE 0.9 % IR SOLN
Status: DC | PRN
  Administered 2020-12-22: 1000 mL

## 2020-12-22 MED ORDER — TRANEXAMIC ACID-NACL 1000-0.7 MG/100ML-% IV SOLN: 1000.0000 mg | Freq: Once | INTRAVENOUS | Status: AC

## 2020-12-22 MED ORDER — TRANEXAMIC ACID-NACL 1000-0.7 MG/100ML-% IV SOLN
INTRAVENOUS | Status: AC
  Administered 2020-12-22: 1000 mg via INTRAVENOUS
  Filled 2020-12-22: qty 100

## 2020-12-22 MED ORDER — ENOXAPARIN SODIUM 40 MG/0.4ML IJ SOSY: 40.0000 mg | PREFILLED_SYRINGE | INTRAMUSCULAR | Status: DC

## 2020-12-22 MED ORDER — SODIUM CHLORIDE 0.9 % IV SOLN
INTRAVENOUS | Status: DC | PRN
  Administered 2020-12-22: 40 ug/min via INTRAVENOUS

## 2020-12-22 MED ORDER — ONDANSETRON HCL 4 MG PO TABS
4.0000 mg | ORAL_TABLET | Freq: Four times a day (QID) | ORAL | Status: DC | PRN
  Administered 2020-12-28: 4 mg via ORAL
  Filled 2020-12-22: qty 1

## 2020-12-22 MED ORDER — BISACODYL 10 MG RE SUPP
10.0000 mg | Freq: Every day | RECTAL | Status: DC | PRN
  Filled 2020-12-22: qty 1

## 2020-12-22 MED ORDER — CEFAZOLIN SODIUM-DEXTROSE 2-4 GM/100ML-% IV SOLN
INTRAVENOUS | Status: AC
  Filled 2020-12-22: qty 100

## 2020-12-22 MED ORDER — BUPIVACAINE LIPOSOME 1.3 % IJ SUSP
INTRAMUSCULAR | Status: AC
  Filled 2020-12-22: qty 20

## 2020-12-22 MED ORDER — CEFAZOLIN SODIUM-DEXTROSE 1-4 GM/50ML-% IV SOLN
1.0000 g | Freq: Four times a day (QID) | INTRAVENOUS | Status: AC
Start: 2020-12-22 — End: 2020-12-23
  Administered 2020-12-22 – 2020-12-23 (×3): 1 g via INTRAVENOUS
  Filled 2020-12-22 (×5): qty 50

## 2020-12-22 MED ORDER — ACETAMINOPHEN 10 MG/ML IV SOLN
INTRAVENOUS | Status: AC
  Filled 2020-12-22: qty 100

## 2020-12-22 MED ORDER — PHENYLEPHRINE HCL (PRESSORS) 10 MG/ML IV SOLN
INTRAVENOUS | Status: AC
  Filled 2020-12-22: qty 1

## 2020-12-22 MED ORDER — FENTANYL CITRATE (PF) 100 MCG/2ML IJ SOLN: 25.0000 ug | INTRAMUSCULAR | Status: DC | PRN

## 2020-12-22 MED ORDER — TRANEXAMIC ACID-NACL 1000-0.7 MG/100ML-% IV SOLN
INTRAVENOUS | Status: DC | PRN
  Administered 2020-12-22: 1000 mg via INTRAVENOUS

## 2020-12-22 MED ORDER — ACETAMINOPHEN 10 MG/ML IV SOLN
INTRAVENOUS | Status: DC | PRN
  Administered 2020-12-22: 1000 mg via INTRAVENOUS

## 2020-12-22 MED ORDER — KETOROLAC TROMETHAMINE 15 MG/ML IJ SOLN
7.5000 mg | Freq: Four times a day (QID) | INTRAMUSCULAR | Status: AC
  Administered 2020-12-22 – 2020-12-23 (×4): 7.5 mg via INTRAVENOUS
  Filled 2020-12-22 (×4): qty 1

## 2020-12-22 MED ORDER — PROPOFOL 500 MG/50ML IV EMUL
INTRAVENOUS | Status: DC | PRN
  Administered 2020-12-22: 20 ug/kg/min via INTRAVENOUS

## 2020-12-22 MED ORDER — ONDANSETRON HCL 4 MG/2ML IJ SOLN: 4.0000 mg | Freq: Once | INTRAMUSCULAR | Status: DC | PRN

## 2020-12-22 MED ORDER — BUPIVACAINE HCL (PF) 0.5 % IJ SOLN
INTRAMUSCULAR | Status: AC
  Filled 2020-12-22: qty 30

## 2020-12-22 MED ORDER — BUPIVACAINE HCL (PF) 0.5 % IJ SOLN
INTRAMUSCULAR | Status: DC | PRN
  Administered 2020-12-22: 2.5 mL via INTRATHECAL

## 2020-12-22 MED ORDER — TRANEXAMIC ACID 1000 MG/10ML IV SOLN
INTRAVENOUS | Status: AC
  Filled 2020-12-22: qty 10

## 2020-12-22 MED ORDER — SODIUM CHLORIDE 0.9 % IV SOLN: INTRAVENOUS | Status: DC

## 2020-12-22 MED ORDER — PHENOL 1.4 % MT LIQD
1.0000 | OROMUCOSAL | Status: DC | PRN
  Filled 2020-12-22: qty 177

## 2020-12-22 MED ORDER — METOCLOPRAMIDE HCL 10 MG PO TABS: 5.0000 mg | ORAL_TABLET | Freq: Three times a day (TID) | ORAL | Status: DC | PRN

## 2020-12-22 MED ORDER — LACTATED RINGERS IV SOLN: INTRAVENOUS | Status: DC | PRN

## 2020-12-22 SURGICAL SUPPLY — 82 items
ADH SKN CLS APL DERMABOND .7 (GAUZE/BANDAGES/DRESSINGS) ×1
APL PRP STRL LF DISP 70% ISPRP (MISCELLANEOUS) ×1
BLADE SAW SGTL 13X75X1.27 (BLADE) ×2 IMPLANT
BLADE SURG SZ10 CARB STEEL (BLADE) ×2 IMPLANT
BNDG COHESIVE 4X5 TAN ST LF (GAUZE/BANDAGES/DRESSINGS) ×2 IMPLANT
CANISTER SUCT 1200ML W/VALVE (MISCELLANEOUS) ×2 IMPLANT
CEMENT BONE 40GM (Cement) ×2 IMPLANT
CEMENT RESTRICTOR DEPUY SZ 4 (Cement) ×1 IMPLANT
CEMENT VACUUM MIXING SYSTEM (MISCELLANEOUS) ×1 IMPLANT
CHLORAPREP W/TINT 26 (MISCELLANEOUS) ×2 IMPLANT
COVER BACK TABLE REUSABLE LG (DRAPES) ×2 IMPLANT
COVER MAYO STAND STRL (DRAPES) ×2 IMPLANT
DERMABOND ADVANCED (GAUZE/BANDAGES/DRESSINGS) ×1
DERMABOND ADVANCED .7 DNX12 (GAUZE/BANDAGES/DRESSINGS) ×1 IMPLANT
DRAPE 3/4 80X56 (DRAPES) ×6 IMPLANT
DRAPE INCISE IOBAN 66X60 STRL (DRAPES) ×2 IMPLANT
DRAPE ORTHO SPLIT 77X108 STRL (DRAPES) ×2
DRAPE SURG 17X11 SM STRL (DRAPES) ×2 IMPLANT
DRAPE SURG ORHT 6 SPLT 77X108 (DRAPES) ×1 IMPLANT
DRAPE U-SHAPE 47X51 STRL (DRAPES) ×2 IMPLANT
DRSG OPSITE POSTOP 4X10 (GAUZE/BANDAGES/DRESSINGS) ×1 IMPLANT
DRSG OPSITE POSTOP 4X8 (GAUZE/BANDAGES/DRESSINGS) ×3 IMPLANT
ELECT BLADE 6.5 EXT (BLADE) ×2 IMPLANT
ELECT CAUTERY BLADE 6.4 (BLADE) ×2 IMPLANT
ELECT REM PT RETURN 9FT ADLT (ELECTROSURGICAL) ×2
ELECTRODE REM PT RTRN 9FT ADLT (ELECTROSURGICAL) ×1 IMPLANT
GAUZE 4X4 16PLY ~~LOC~~+RFID DBL (SPONGE) ×2 IMPLANT
GAUZE SPONGE 4X4 12PLY STRL (GAUZE/BANDAGES/DRESSINGS) ×2 IMPLANT
GAUZE XEROFORM 1X8 LF (GAUZE/BANDAGES/DRESSINGS) ×2 IMPLANT
GLOVE SRG 8 PF TXTR STRL LF DI (GLOVE) ×2 IMPLANT
GLOVE SURG ORTHO LTX SZ8 (GLOVE) ×4 IMPLANT
GLOVE SURG UNDER POLY LF SZ8 (GLOVE) ×4
GOWN STRL REUS W/ TWL LRG LVL3 (GOWN DISPOSABLE) ×1 IMPLANT
GOWN STRL REUS W/ TWL XL LVL3 (GOWN DISPOSABLE) ×1 IMPLANT
GOWN STRL REUS W/TWL LRG LVL3 (GOWN DISPOSABLE) ×2
GOWN STRL REUS W/TWL XL LVL3 (GOWN DISPOSABLE) ×2
HEAD MODULAR ENDO (Orthopedic Implant) ×2 IMPLANT
HEAD UNPLR 47XMDLR STRL HIP (Orthopedic Implant) IMPLANT
HEMOVAC 400ML (MISCELLANEOUS)
HOOD PEEL AWAY FLYTE STAYCOOL (MISCELLANEOUS) ×2 IMPLANT
IV NS 100ML SINGLE PACK (IV SOLUTION) ×1 IMPLANT
IV NS IRRIG 3000ML ARTHROMATIC (IV SOLUTION) ×2 IMPLANT
KIT DRAIN HEMOVAC JP 7FR 400ML (MISCELLANEOUS) IMPLANT
KIT TURNOVER KIT A (KITS) ×2 IMPLANT
MANIFOLD NEPTUNE II (INSTRUMENTS) ×4 IMPLANT
NDL FILTER BLUNT 18X1 1/2 (NEEDLE) ×1 IMPLANT
NDL MAYO CATGUT SZ4 TPR NDL (NEEDLE) ×1 IMPLANT
NDL SAFETY ECLIPSE 18X1.5 (NEEDLE) ×1 IMPLANT
NEEDLE FILTER BLUNT 18X 1/2SAF (NEEDLE) ×1
NEEDLE FILTER BLUNT 18X1 1/2 (NEEDLE) ×1 IMPLANT
NEEDLE HYPO 18GX1.5 SHARP (NEEDLE) ×2
NEEDLE HYPO 22GX1.5 SAFETY (NEEDLE) ×2 IMPLANT
NEEDLE MAYO CATGUT SZ4 (NEEDLE) ×2 IMPLANT
NS IRRIG 1000ML POUR BTL (IV SOLUTION) ×2 IMPLANT
PACK HIP PROSTHESIS (MISCELLANEOUS) ×2 IMPLANT
PENCIL SMOKE EVACUATOR (MISCELLANEOUS) ×2 IMPLANT
PILLOW ABDUCTION FOAM SM (MISCELLANEOUS) ×2 IMPLANT
PRESSURIZER CEMENT PROX FEM SM (MISCELLANEOUS) ×1 IMPLANT
PULSAVAC PLUS IRRIG FAN TIP (DISPOSABLE) ×2
RETRIEVER SUT HEWSON (MISCELLANEOUS) IMPLANT
SLEEVE UNITRAX V40 (Orthopedic Implant) ×2 IMPLANT
SLEEVE UNITRAX V40 +8 (Orthopedic Implant) IMPLANT
SPACER OSTEO CEMENT (Orthopedic Implant) ×2 IMPLANT
SPACER OSTEO CEMENT 14 HIP (Orthopedic Implant) IMPLANT
SPONGE T-LAP 18X18 ~~LOC~~+RFID (SPONGE) ×8 IMPLANT
STAPLER SKIN PROX 35W (STAPLE) ×2 IMPLANT
STEM HIP ACCOLADE SZ5 37X145 (Stem) ×1 IMPLANT
SUT ETHIBOND #5 BRAIDED 30INL (SUTURE) ×2 IMPLANT
SUT MNCRL 4-0 (SUTURE) ×2
SUT MNCRL 4-0 27XMFL (SUTURE) ×1
SUT VIC AB 0 CT1 36 (SUTURE) ×2 IMPLANT
SUT VIC AB 2-0 CT2 27 (SUTURE) ×4 IMPLANT
SUTURE MNCRL 4-0 27XMF (SUTURE) ×1 IMPLANT
SYR 20ML LL LF (SYRINGE) ×2 IMPLANT
SYR 50ML LL SCALE MARK (SYRINGE) ×2 IMPLANT
TAPE MICROFOAM 4IN (TAPE) ×1 IMPLANT
TAPE TRANSPORE STRL 2 31045 (GAUZE/BANDAGES/DRESSINGS) ×2 IMPLANT
TIP BRUSH PULSAVAC PLUS 24.33 (MISCELLANEOUS) ×2 IMPLANT
TIP FAN IRRIG PULSAVAC PLUS (DISPOSABLE) ×1 IMPLANT
TRAY FOLEY SLVR 16FR LF STAT (SET/KITS/TRAYS/PACK) ×1 IMPLANT
TUBE KAMVAC SUCTION (TUBING) ×2 IMPLANT
TUBE SUCT KAM VAC (TUBING) ×2 IMPLANT

## 2020-12-22 NOTE — Progress Notes (Signed)
   12/22/20 2050 12/22/20 2109 12/22/20 2137  Assess: MEWS Score  Temp 98.7 F (37.1 C)  --   --   BP (!) 119/47 (!) 118/50  --   Pulse Rate (!) 108 (!) 105  --   Resp 18  --   --   Level of Consciousness  --   --  Alert  SpO2 91 % 91 %  --   O2 Device Room Air  --   --

## 2020-12-22 NOTE — Anesthesia Preprocedure Evaluation (Signed)
Anesthesia Evaluation  Patient identified by MRN, date of birth, ID band Patient awake and Patient confused    Reviewed: Allergy & Precautions, NPO status , Patient's Chart, lab work & pertinent test results  History of Anesthesia Complications Negative for: history of anesthetic complications  Airway Mallampati: II  TM Distance: >3 FB Neck ROM: Full    Dental no notable dental hx.    Pulmonary neg pulmonary ROS, neg sleep apnea, neg COPD,    breath sounds clear to auscultation- rhonchi (-) wheezing      Cardiovascular hypertension, + Past MI (silent) and +CHF  (-) CAD, (-) Cardiac Stents and (-) CABG  Rhythm:Regular Rate:Normal - Systolic murmurs and - Diastolic murmurs    Neuro/Psych neg Seizures PSYCHIATRIC DISORDERS Dementia negative neurological ROS     GI/Hepatic negative GI ROS, Neg liver ROS,   Endo/Other  negative endocrine ROSneg diabetes  Renal/GU negative Renal ROS     Musculoskeletal negative musculoskeletal ROS (+)   Abdominal (+) - obese,   Peds  Hematology negative hematology ROS (+)   Anesthesia Other Findings Past Medical History: No date: Dementia (HCC) No date: Thyroid disease   Reproductive/Obstetrics                             Lab Results  Component Value Date   WBC 11.9 (H) 12/22/2020   HGB 10.8 (L) 12/22/2020   HCT 32.5 (L) 12/22/2020   MCV 93.9 12/22/2020   PLT 259 12/22/2020    Anesthesia Physical Anesthesia Plan  ASA: 3  Anesthesia Plan: Spinal   Post-op Pain Management:    Induction:   PONV Risk Score and Plan: 2 and Ondansetron and Propofol infusion  Airway Management Planned: Natural Airway  Additional Equipment:   Intra-op Plan:   Post-operative Plan:   Informed Consent: I have reviewed the patients History and Physical, chart, labs and discussed the procedure including the risks, benefits and alternatives for the proposed anesthesia  with the patient or authorized representative who has indicated his/her understanding and acceptance.   Patient has DNR.  Discussed DNR with power of attorney and Suspend DNR.   Dental advisory given and Consent reviewed with POA  Plan Discussed with: CRNA and Anesthesiologist  Anesthesia Plan Comments:         Anesthesia Quick Evaluation

## 2020-12-22 NOTE — Consult Note (Signed)
[place] Nurse Consult Note: Reason for Consult: skin tears and pressure injury Wound type: Multiple skin tears over the bilateral UE and LE; appear to be related to fall Pressure Injury POA: NA Measurement: Multiple areas all less than 1cm circumference; full thickness  Wound [id number]; no purulent drainage; all without skin roof Drainage (amount, consistency, odor) minimal  Periwound: bruising over the UE and LEs  Dressing procedure/placement/frequency: Single layer of xeroform to the skin tears, cover with foam. Change every 3 days and PRN soilage.   Discussed POC with bedside nurse.  Re consult if needed, will not follow at this time. Thanks  Henson Fraticelli [employer], RN,CWOCN, CNS, [place] [phone])

## 2020-12-22 NOTE — Transfer of Care (Signed)
Immediate Anesthesia Transfer of Care Note  Patient: Aimee Kirk  Procedure(s) Performed: ARTHROPLASTY BIPOLAR HIP (HEMIARTHROPLASTY) (Left: Hip)  Patient Location: PACU  Anesthesia Type:Spinal  Level of Consciousness: drowsy  Airway & Oxygen Therapy: Patient Spontanous Breathing and Patient connected to face mask oxygen  Post-op Assessment: Report given to RN  Post vital signs: Reviewed and stable  Last Vitals:  Vitals Value Taken Time  BP 94/45 12/22/20 1347  Temp    Pulse 77 12/22/20 1355  Resp 16 12/22/20 1355  SpO2 100 % 12/22/20 1355  Vitals shown include unvalidated device data.  Last Pain:  Vitals:   12/22/20 0935  TempSrc: Oral  PainSc: Asleep         Complications: No notable events documented.

## 2020-12-22 NOTE — Anesthesia Procedure Notes (Signed)
Spinal  Patient location during procedure: OR Start time: 12/22/2020 11:28 AM End time: 12/22/2020 11:33 AM Reason for block: surgical anesthesia Staffing Performed: resident/CRNA  Anesthesiologist: Emmie Niemann, MD Resident/CRNA: Tollie Eth, CRNA Preanesthetic Checklist Completed: patient identified, IV checked, site marked, risks and benefits discussed, surgical consent, monitors and equipment checked, pre-op evaluation and timeout performed Spinal Block Patient position: sitting Prep: ChloraPrep Patient monitoring: heart rate, continuous pulse ox, blood pressure and cardiac monitor Approach: midline Location: L4-5 Injection technique: single-shot Needle Needle type: Introducer and Pencil-Tip  Needle gauge: 24 G Needle length: 9 cm Assessment Events: CSF return Additional Notes Negative paresthesia. Negative blood return. Positive free-flowing CSF. Expiration date of kit checked and confirmed. Patient tolerated procedure well, without complications.

## 2020-12-22 NOTE — Progress Notes (Signed)
PROGRESS NOTE    WM FALKENHAGEN  [id number] DOB: 1923-02-01 DOA: 12/21/2020 PCP: Orvis Brill, [facility]    Brief Narrative:  BELLARAE RELEFORD is a 87 y.o. female with medical history significant for advanced dementia, chronic diastolic heart failure, moderate mitral regurgitation, who is admitted to [facility] on 12/21/2020 with acute left femoral neck fracture after presenting from ALF to [facility] ED for evaluation of fall.  mages study showed left hip fracture.  Patient had a surgery performed on 8/8.   Assessment & Plan:   Principal Problem:   Closed left hip fracture [facility]) Active Problems:   Fall   Protein calorie malnutrition (HCC)   Leukocytosis   Chronic diastolic CHF (congestive heart failure) ([place])   Essential hypertension  #1.  Left femoral neck fracture. Mechanical fall. Status post surgery. Patient currently sleepy, will continue some fluids as patient p.o. intake is not adequate yet.  #2.  Chronic diastolic congestive heart failure. Currently patient does not have any volume overload, more continue gentle rehydration.    DVT prophylaxis: Lovenox Code Status: DNR Family Communication:  Disposition Plan:    Status is: Inpatient  Remains inpatient appropriate because:IV treatments appropriate due to intensity of illness or inability to take PO and Inpatient level of care appropriate due to severity of illness  Dispo: The patient is from: SNF              Anticipated d/c is to: SNF              Patient currently is not medically stable to d/c.   Difficult to place patient No        No intake/output data recorded. Total I/O In: 710 [I.V.:500; IV Piggyback:210] Out: 76 [Urine:580; Blood:150]     Consultants:  Orthopedics  Procedures: hip  Antimicrobials: None  Subjective: Patient doing well postop.  Currently sleepy. No hypoxia, no short of breath. No abdominal pain or nausea vomiting. No fever or  chills  Objective: Vitals:   12/22/20 1415 12/22/20 1430 12/22/20 1445 12/22/20 1511  BP: 98/70 (!) 105/47 113/61 (!) 122/49  Pulse: 95 86 87 97  Resp: (!) '23 18 20 16  '$ Temp:   98 F (36.7 C) 97.9 F (36.6 C)  TempSrc:      SpO2: 91% 92% 100% 91%  Weight:      Height:        Intake/Output Summary (Last 24 hours) at 12/22/2020 1637 Last data filed at 12/22/2020 1450 Gross per 24 hour  Intake 710 ml  Output 730 ml  Net -20 ml   Filed Weights   12/21/20 1600 12/22/20 0500  Weight: 45.4 kg 53.4 kg    Examination:  General exam: Appears calm and comfortable  Respiratory system: Clear to auscultation. Respiratory effort normal. Cardiovascular system: S1 & S2 heard, RRR. No JVD, murmurs, rubs, gallops or clicks. No pedal edema. Gastrointestinal system: Abdomen is nondistended, soft and nontender. No organomegaly or masses felt. Normal bowel sounds heard. Central nervous system: Alert and oriented x1. No focal neurological deficits. Extremities: Symmetric 5 x 5 power. Skin: No rashes, lesions or ulcers Psychiatry:  Mood & affect appropriate.     Data Reviewed: I have personally reviewed following labs and imaging studies  CBC: Recent Labs  Lab 12/21/20 1735 12/22/20 0421  WBC 21.2* 11.9*  NEUTROABS 19.5* 9.9*  HGB 12.2 10.8*  HCT 35.9* 32.5*  MCV 92.1 93.9  PLT 275 [id number]   Basic Metabolic Panel: Recent Labs  Lab  12/21/20 1735 12/21/20 1935 12/22/20 0421  NA 134*  --  134*  K 4.3  --  4.2  CL 100  --  101  CO2 27  --  28  GLUCOSE 143*  --  113*  BUN 19  --  18  CREATININE 0.93  --  0.82  CALCIUM 9.0  --  8.5*  MG  --  1.9 2.3   GFR: Estimated Creatinine Clearance: 31.7 mL/min (by C-G formula based on SCr of 0.82 mg/dL). Liver Function Tests: Recent Labs  Lab 12/21/20 1735 12/22/20 0421  AST 36 29  ALT 23 21  ALKPHOS 108 91  BILITOT 1.2 1.5*  PROT 6.9 6.1*  ALBUMIN 4.1 3.5   No results for input(s): LIPASE, AMYLASE in the last 168 hours. No  results for input(s): AMMONIA in the last 168 hours. Coagulation Profile: Recent Labs  Lab 12/22/20 0421  INR 1.0   Cardiac Enzymes: No results for input(s): CKTOTAL, CKMB, CKMBINDEX, TROPONINI in the last 168 hours. BNP (last 3 results) No results for input(s): PROBNP in the last 8760 hours. HbA1C: No results for input(s): HGBA1C in the last 72 hours. CBG: No results for input(s): GLUCAP in the last 168 hours. Lipid Profile: No results for input(s): CHOL, HDL, LDLCALC, TRIG, CHOLHDL, LDLDIRECT in the last 72 hours. Thyroid Function Tests: No results for input(s): TSH, T4TOTAL, FREET4, T3FREE, THYROIDAB in the last 72 hours. Anemia Panel: No results for input(s): VITAMINB12, FOLATE, FERRITIN, TIBC, IRON, RETICCTPCT in the last 72 hours. Sepsis Labs: No results for input(s): PROCALCITON, LATICACIDVEN in the last 168 hours.  Recent Results (from the past 240 hour(s))  Resp Panel by RT-PCR (Flu A&B, Covid) Nasopharyngeal Swab     Status: None   Collection Time: 12/21/20  5:35 PM   Specimen: Nasopharyngeal Swab; Nasopharyngeal(NP) swabs in vial transport medium  Result Value Ref Range Status   SARS Coronavirus 2 by RT PCR NEGATIVE NEGATIVE Final    Comment: (NOTE) SARS-CoV-2 target nucleic acids are NOT DETECTED.  The SARS-CoV-2 RNA is generally detectable in upper respiratory specimens during the acute phase of infection. The lowest concentration of SARS-CoV-2 viral copies this assay can detect is 138 copies/mL. A negative result does not preclude SARS-Cov-2 infection and should not be used as the sole basis for treatment or other patient management decisions. A negative result may occur with  improper specimen collection/handling, submission of specimen other than nasopharyngeal swab, presence of viral mutation(s) within the areas targeted by this assay, and inadequate number of viral copies(<138 copies/mL). A negative result must be combined with clinical observations,  patient history, and epidemiological information. The expected result is Negative.  Fact Sheet for Patients:  EntrepreneurPulse.com.au  Fact Sheet for Healthcare Providers:  IncredibleEmployment.be  This test is no t yet approved or cleared by the [place] FDA and  has been authorized for detection and/or diagnosis of SARS-CoV-2 by FDA under an Emergency Use Authorization (EUA). This EUA will remain  in effect (meaning this test can be used) for the duration of the COVID-19 declaration under Section 564(b)(1) of the Act, 21 U.S.C.section 360bbb-3(b)(1), unless the authorization is terminated  or revoked sooner.       Influenza A by PCR NEGATIVE NEGATIVE Final   Influenza B by PCR NEGATIVE NEGATIVE Final    Comment: (NOTE) The Xpert Xpress SARS-CoV-2/FLU/RSV plus assay is intended as an aid in the diagnosis of influenza from Nasopharyngeal swab specimens and should not be used as a sole basis for treatment.  Nasal washings and aspirates are unacceptable for Xpert Xpress SARS-CoV-2/FLU/RSV testing.  Fact Sheet for Patients: EntrepreneurPulse.com.au  Fact Sheet for Healthcare Providers: IncredibleEmployment.be  This test is not yet approved or cleared by the [place] FDA and has been authorized for detection and/or diagnosis of SARS-CoV-2 by FDA under an Emergency Use Authorization (EUA). This EUA will remain in effect (meaning this test can be used) for the duration of the COVID-19 declaration under Section 564(b)(1) of the Act, 21 U.S.C. section 360bbb-3(b)(1), unless the authorization is terminated or revoked.  Performed at [facility], [address] [place], [place] [address]          Radiology Studies: DG Chest 1 View  Result Date: 12/21/2020 CLINICAL DATA:  Pain after falling. EXAM: CHEST  1 VIEW COMPARISON:  03/05/2020 FINDINGS: Heart is enlarged and stable in  configuration. Stable elevation of the LEFT hemidiaphragm. Lungs are clear. No pneumothorax. No acute displaced fractures. Chronic changes in both shoulders, RIGHT greater than LEFT. IMPRESSION: Stable cardiomegaly.  No evidence for acute  abnormality. Electronically Signed   By: Nolon Nations M.D.   On: 12/21/2020 16:36   CT HEAD WO CONTRAST (5MM)  Result Date: 12/21/2020 CLINICAL DATA:  Mental status change. EXAM: CT HEAD WITHOUT CONTRAST TECHNIQUE: Contiguous axial images were obtained from the base of the skull through the vertex without intravenous contrast. COMPARISON:  03/05/2020 FINDINGS: Brain: Stable age related cerebral atrophy, ventriculomegaly and periventricular white matter disease. No extra-axial fluid collections are identified. No CT findings for acute hemispheric infarction or intracranial hemorrhage. No mass lesions. The brainstem and cerebellum are normal. Vascular: Stable vascular calcifications. No aneurysm or hyperdense vessels. Skull: No skull fracture or bone lesions. Sinuses/Orbits: Paranasal sinuses and mastoid air cells are clear. The globes are intact. Other: No scalp lesions or scalp hematoma. IMPRESSION: 1. Stable age related cerebral atrophy, ventriculomegaly and periventricular white matter disease. 2. No acute intracranial findings or mass lesions. Electronically Signed   By: Marijo Sanes M.D.   On: 12/21/2020 16:37   DG Pelvis Portable  Result Date: 12/22/2020 CLINICAL DATA:  Postop left hip arthroplasty. EXAM: PORTABLE PELVIS 1-2 VIEWS COMPARISON:  Preoperative radiograph yesterday. FINDINGS: Left hip arthroplasty in expected alignment. There is no periprosthetic lucency or fracture. Recent postsurgical change includes air and edema in the soft tissues as well as lateral skin staples. The bones are under mineralized IMPRESSION: Left hip arthroplasty without immediate postoperative complication. Electronically Signed   By: Keith Rake M.D.   On: 12/22/2020 15:04    DG Pelvis Portable  Result Date: 12/22/2020 CLINICAL DATA:  Elective surgery.  Intraop. EXAM: PORTABLE PELVIS 1-2 VIEWS COMPARISON:  Preoperative radiograph yesterday. FINDINGS: AP view of the pelvis obtained in the operating room during left hip arthroplasty. Femoral component is in place. IMPRESSION: Intraoperative radiograph during left hip arthroplasty. Electronically Signed   By: Keith Rake M.D.   On: 12/22/2020 15:04   DG Pelvis Portable  Result Date: 12/22/2020 CLINICAL DATA:  Elective surgery. EXAM: PORTABLE PELVIS 1-2 VIEWS COMPARISON:  Pelvis and hip radiograph yesterday. FINDINGS: AP view of the pelvis obtained in the operating room during left hip arthroplasty. Femoral spacer is in place. IMPRESSION: Intraoperative radiograph during left hip arthroplasty. Electronically Signed   By: Keith Rake M.D.   On: 12/22/2020 15:03   DG Hip Unilat With Pelvis 2-3 Views Left  Result Date: 12/21/2020 CLINICAL DATA:  Pain after falling. EXAM: DG HIP (WITH OR WITHOUT PELVIS) 2-3V LEFT COMPARISON:  03/05/2020 FINDINGS: There  is acute fracture of the LEFT femoral neck, associated with foreshortening and varus angulation. There is no evidence for dislocation or subluxation of the femoral head. The limited view is somewhat limited evaluation of location of the femoral head. Remainder of the pelvis is intact. Degenerative changes are seen in the LOWER lumbar spine and both hips. Significant stool burden incidentally noted. IMPRESSION: Acute fracture of the LEFT femoral neck. Limited evaluation for dislocation without evidence for dislocation. Significant stool burden. Electronically Signed   By: Nolon Nations M.D.   On: 12/21/2020 16:35        Scheduled Meds:  acetaminophen  1,000 mg Oral Q8H   docusate sodium  100 mg Oral BID   [START ON 12/23/2020] enoxaparin (LOVENOX) injection  40 mg Subcutaneous Q24H   ketorolac  7.5 mg Intravenous Q6H   metoprolol tartrate  50 mg Oral BID    Continuous Infusions:  sodium chloride      ceFAZolin (ANCEF) IV       LOS: 1 day    Time spent: Stuart, MD [facility]   To contact the attending provider between 7A-7P or the covering provider during after hours 7P-7A, please log into the web site [email or web address] and access using universal [place] password for that web site. If you do not have the password, please call the hospital operator.  12/22/2020, 4:37 PM

## 2020-12-22 NOTE — Consult Note (Signed)
ORTHOPAEDIC CONSULTATION  REQUESTING PHYSICIAN: Sharen Hones, MD  Chief Complaint:   Left hip pain.  History of Present Illness: Aimee Kirk is a 87 y.o. female with a history of dementia, hypertension, and thyroid disease who lives in an assisted living facility.  Apparently, the patient had been moved from 1 facility to another on Saturday.  Apparently, while bending over to tie her shoe yesterday, she lost her balance and fell onto her left hip.  Initially, she was able to get up and take a few steps, then states she could not walk, according to the care provider at the rehab facility.  The patient was brought to the emergency room where x-rays demonstrated a displaced left femoral neck fracture.  The patient apparently did not sustain any associated injuries.  She did not strike her head or lose consciousness.  The patient is a poor historian and cannot state whether she was lightheaded, dizzy, or had any associated symptoms such as chest pain, shortness of breath, or other precipitating factors leading to her fall.  Past Medical History:  Diagnosis Date   Dementia ([place])    Thyroid disease    Past Surgical History:  Procedure Laterality Date   ABDOMINAL HYSTERECTOMY     Social History   Socioeconomic History   Marital status: Widowed    Spouse name: Not on file   Number of children: Not on file   Years of education: Not on file   Highest education level: Not on file  Occupational History   Not on file  Tobacco Use   Smoking status: Never   Smokeless tobacco: Never  Vaping Use   Vaping Use: Never used  Substance and Sexual Activity   Alcohol use: Never   Drug use: Never   Sexual activity: Not on file  Other Topics Concern   Not on file  Social History Narrative   Not on file   Social Determinants of Health   Financial Resource Strain: Not on file  Food Insecurity: Not on file  Transportation Needs:  Not on file  Physical Activity: Not on file  Stress: Not on file  Social Connections: Not on file   Family History  Problem Relation Age of Onset   Arthritis Mother    Heart disease Father    Allergies  Allergen Reactions   Remdesivir     Pt son states he absolutely does not want his mother to receive this medication    Prior to Admission medications   Medication Sig Start Date End Date Taking? Authorizing Provider  aspirin 81 MG chewable tablet Chew 1 tablet (81 mg total) by mouth daily. 03/09/20   Fritzi Mandes, MD  ferrous sulfate 325 (65 FE) MG tablet Take 1 tablet (325 mg total) by mouth daily for 14 days. 03/11/20 03/25/20  Duffy Bruce, MD  metoprolol tartrate (LOPRESSOR) 50 MG tablet Take 1 tablet (50 mg total) by mouth 2 (two) times daily. 03/08/20   Fritzi Mandes, MD   DG Chest 1 View  Result Date: 12/21/2020 CLINICAL DATA:  Pain after falling. EXAM: CHEST  1 VIEW COMPARISON:  03/05/2020 FINDINGS: Heart is enlarged and stable in configuration. Stable elevation of the LEFT hemidiaphragm. Lungs are clear. No pneumothorax. No acute displaced fractures. Chronic changes in both shoulders, RIGHT greater than LEFT. IMPRESSION: Stable cardiomegaly.  No evidence for acute  abnormality. Electronically Signed   By: Nolon Nations M.D.   On: 12/21/2020 16:36   CT HEAD WO CONTRAST (5MM)  Result Date: 12/21/2020  CLINICAL DATA:  Mental status change. EXAM: CT HEAD WITHOUT CONTRAST TECHNIQUE: Contiguous axial images were obtained from the base of the skull through the vertex without intravenous contrast. COMPARISON:  03/05/2020 FINDINGS: Brain: Stable age related cerebral atrophy, ventriculomegaly and periventricular white matter disease. No extra-axial fluid collections are identified. No CT findings for acute hemispheric infarction or intracranial hemorrhage. No mass lesions. The brainstem and cerebellum are normal. Vascular: Stable vascular calcifications. No aneurysm or hyperdense vessels.  Skull: No skull fracture or bone lesions. Sinuses/Orbits: Paranasal sinuses and mastoid air cells are clear. The globes are intact. Other: No scalp lesions or scalp hematoma. IMPRESSION: 1. Stable age related cerebral atrophy, ventriculomegaly and periventricular white matter disease. 2. No acute intracranial findings or mass lesions. Electronically Signed   By: Marijo Sanes M.D.   On: 12/21/2020 16:37   DG Hip Unilat With Pelvis 2-3 Views Left  Result Date: 12/21/2020 CLINICAL DATA:  Pain after falling. EXAM: DG HIP (WITH OR WITHOUT PELVIS) 2-3V LEFT COMPARISON:  03/05/2020 FINDINGS: There is acute fracture of the LEFT femoral neck, associated with foreshortening and varus angulation. There is no evidence for dislocation or subluxation of the femoral head. The limited view is somewhat limited evaluation of location of the femoral head. Remainder of the pelvis is intact. Degenerative changes are seen in the LOWER lumbar spine and both hips. Significant stool burden incidentally noted. IMPRESSION: Acute fracture of the LEFT femoral neck. Limited evaluation for dislocation without evidence for dislocation. Significant stool burden. Electronically Signed   By: Nolon Nations M.D.   On: 12/21/2020 16:35    Positive ROS: All other systems have been reviewed and were otherwise negative with the exception of those mentioned in the HPI and as above.  Physical Exam: General:  Alert, no acute distress Psychiatric:  Patient is competent for consent with normal mood and affect   Cardiovascular:  No pedal edema Respiratory:  No wheezing, non-labored breathing GI:  Abdomen is soft and non-tender Skin:  No lesions in the area of chief complaint Neurologic:  Sensation intact distally Lymphatic:  No axillary or cervical lymphadenopathy  Orthopedic Exam:  Orthopedic examination is limited to the left hip and lower extremity.  The left lower extremity is somewhat shortened and externally rotated as compared to the  right.  Skin inspection around the left hip is unremarkable.  No swelling, erythema, ecchymosis, abrasions, or other skin abnormalities are identified.  She has mild tenderness to palpation over the lateral aspect of the left hip.  She has more severe pain with any attempted active or passive motion of the left hip.  She is grossly neurovascularly intact to the left lower extremity and foot.  X-rays:  X-rays of the pelvis and left hip are available for review and have been reviewed by myself.  These films demonstrate a displaced left femoral neck fracture.  No significant degenerative changes of the left hip are noted.  No lytic lesions or other acute bony abnormalities are identified.  However, there is evidence of at least moderate osteopenia throughout the bony structures visualized.  Assessment: Closed displaced left femoral neck fracture.  Plan: The treatment options have been discussed with the patient's son, who is the patient's power of attorney and healthcare proxy, including both surgical and nonsurgical choices.  On behalf of the patient, the son would like to proceed with surgical intervention to include a left hip hemiarthroplasty.  This procedure has been discussed in detail with the patient's son, as have the potential risks (  including bleeding, infection, nerve and/or blood vessel injury, persistent or recurrent pain, leg length inequality, dislocation, loosening of and/or failure of the components, need for further surgery, blood clots, strokes, heart attacks and/or arrhythmias, etc.) and benefits.  The patient's son states his understanding and wishes to proceed.  A formal written consent has been signed.  Most likely this procedure will be done by Dr. Posey Pronto around noon today.  Thank you for asked me to participate in the care of this most unfortunate woman.  Either myself or my partner will be happy to follow her with you.   Pascal Lux, MD  Beeper #:  [phone]  12/22/2020 8:08 AM

## 2020-12-22 NOTE — H&P (Signed)
Paper H&P to be scanned into permanent record. H&P reviewed. No significant changes noted.  Please see Dr. Nicholaus Bloom consult note for full details.   I am taking over the orthopedic care of this patient from Dr. Roland Rack.

## 2020-12-22 NOTE — Progress Notes (Signed)
Initial Nutrition Assessment  DOCUMENTATION CODES:  Not applicable  INTERVENTION:  Advance to regular diet after surgery today Ensure Enlive po BID, each supplement provides 350 kcal and 20 grams of protein Magic cup TID with meals after diet advancement, each supplement provides 290 kcal and 9 grams of protein  NUTRITION DIAGNOSIS:  Increased nutrient needs related to hip fracture, post-op healing as evidenced by estimated needs.  GOAL:  Patient will meet greater than or equal to 90% of their needs  MONITOR:  PO intake, Supplement acceptance, Skin  REASON FOR ASSESSMENT:  Consult Assessment of nutrition requirement/status, Malnutrition Eval  ASSESSMENT:  87 y.o. female presented to ED after a fall at her ALF. Hx of advanced dementia, unable to provide a hx but complained of left sided pain. Imaging in ED showed  a displaced left femoral neck fracture. PMH: HTN  Orthopedics consulted and pt taken to OR for repair 8/8. Out of room at the time of assessment.    Nutritionally Relevant Medications: Scheduled Meds:  docusate sodium  100 mg Oral BID   Labs Reviewed: Na 134  NUTRITION - FOCUSED PHYSICAL EXAM: Defer to in-person assessment  Diet Order:   Diet Order             Diet NPO time specified Except for: Sips with Meds  Diet effective midnight                  EDUCATION NEEDS:  No education needs have been identified at this time  Skin:  Skin Assessment: Reviewed RN Assessment (skin tears)  Last BM:  PTA  Height:  Ht Readings from Last 1 Encounters:  12/21/20 '5\' 2"'$  (1.575 m)    Weight:  Wt Readings from Last 1 Encounters:  12/22/20 53.4 kg    Ideal Body Weight:  50 kg  BMI:  Body mass index is 21.53 kg/m.  Estimated Nutritional Needs:  Kcal:  1300-1500 kcal/d Protein:  65-75 g/d Fluid:  >1.5 L/d   Ranell Patrick, RD, LDN Clinical Dietitian Pager on [place]

## 2020-12-22 NOTE — Progress Notes (Signed)
Nurse supervisor notified.

## 2020-12-22 NOTE — Op Note (Signed)
DATE OF SURGERY: 12/22/2020  PREOPERATIVE DIAGNOSIS: Left femoral neck fracture  POSTOPERATIVE DIAGNOSIS: Left femoral neck fracture  PROCEDURE: Left hip hemiarthroplasty  SURGEON: Cato Mulligan, MD  ASSISTANTS:  Anitra Lauth, PA   EBL: 150 cc  COMPONENTS:  [employer] - Accolade C Size 5 Stem [employer]ker - Unitrax 35m head with +8 offset neck   INDICATIONS: Aimee SPARLINGis a 87y.o. female who sustained a displaced femoral neck fracture after a fall. Risks and benefits of hip hemiarthroplasty were explained to the patient and/or family. Risks include but are not limited to bleeding, infection, injury to tissues, nerves, vessels, periprosthetic infection, dislocation, limb length discrepancy and risks of anesthesia. The patient and/or family understands these risks, has completed an informed consent and wishes to proceed.   PROCEDURE:  The patient was identified in the preoperative holding area and the operative extremity was marked.  The patient was then transferred to the operating room suite and mobilized from the hospital gurney to the operating room table. Spinal anesthesia was administered without complication. The patient was then transitioned to a lateral position.  All bony prominences were padded per protocol.  An axillary roll was placed.  Careful attention was paid to the contralateral side peroneal nerve, which was free from pressure with use of appropriate padding and blankets. A time-out was performed to confirm the patient's identity and the correct laterality of surgery. The patient was then prepped and draped in the usual sterile fashion. Appropriate pre-operative antibiotics were administered. Tranexamic acid was administered preoperatively.    An incision that centered on the posterior tip of the greater trochanter with a posterior curve was made. Dissection was carried down through the subcutaneous tissue.  Careful attention was made to maintain hemostasis using  electrocautery.  Dissection brought uKoreato the level of the deep fascia where the gluteus maximus muscle and proximal portion of the IT band were identified.  The proximal region of the IT band was incised in linear fashion and this incision was extended proximally in a curvilinear fashion to split the gluteus maximus muscle parallel to its fibers to minimize bleeding.  This was accomplished using a combination bovie electrocautery as well as blunt dissection.  The trochanteric bursa was then visualized and dissected from anterior to posterior. A blunt homan retractor was placed beneath the abductors. The piriformis tendon and short external rotators were visualized. Bovie electrocautery was used to cut these with the capsule as one L-shaped flap. This was tagged at the corner with #5 Ethibond. At this point, the femoral neck fracture was visualized. An oscillating saw was used to make a new neck cut approximately 152mabove the lesser tuberosity with the use of a neck cut guide. The head was then freed from its remaining soft tissue attachments and measured. The head trial was then inserted into the acetabulum and sized appropriately.    We then turned our attention to preparing the femoral canal. First, a box cut was performed utilizing the box osteotome. A canal finder was inserted by hand and sequential broaching was then performed. The calcar planer was inserted onto the broach and used to smooth the calcar appropriately.  A trial stem, neutral neck, and head were inserted into the acetabulum and placed through range of motion. Intraoperative radiographs were taken to assess hardware position and leg lengths. The hip was again dislocated and the femoral trial components were removed. An appropriately sized cement restrictor was placed. The canal was irrigated with pulse lavage and  dried. A cement gun was used to place cement into the femoral canal. Cement was then pressurized. The actual stem was inserted into  the femoral canal and then driven onto the calcar. Position was held until the cement hardened. The trial head was then again inserted on the femoral component and found to be appropriate. Trial was removed and the permanent head/neck was Morse tapered onto the femoral stem and then reduced into the acetabulum.    The hip stability and length were reassessed and found to be satisfactory.  The wound was then copiously irrigated with normal saline solution. The tagged sutures of the capsule and piriformis were sewn to the gluteus medius tendon. This adequately closed the hip capsule. The IT band and gluteus maximus fascia were then closed with 0-Vicryl in a running, locked fashion. A mixture of Exparel and Sensoricaine was administered.  The subdermal layer was closed with 2-0 Vicryl in a buried interrupted fashion. Skin was approximated with staples.  Sterile dressing was applied. An abduction pillow was placed. The patient was mobilized from the lateral position back to supine on the operating room table and then awakened from general anesthesia without complication.  Of note, assistance from a PA was essential to performing the surgery.  PA was present for the entire surgery.  PA assisted with patient positioning, retraction, and instrumentation. The surgery would have been more difficult and had longer operative time without PA assistance.    POSTOPERATIVE PLAN: The patient will be WBAT on operative extremity. Lovenox '40mg'$ /day x 4 weeks to start on POD#1. IV Abx x 24 hours. PT/OT on POD#1. Posterior hip precautions.

## 2020-12-22 NOTE — Progress Notes (Signed)
Patient is unable to answer appropriate questions to assess her regional assessment due to history of dementia. I will return patient to her room. I asked patient if she is having any fine and she shook her head no and fell back asleep.

## 2020-12-22 NOTE — Progress Notes (Signed)
Manufacturing engineer hospital Liaison note:  This patient is currently followed by [facility] hospice services at [facility] care home. She has a hospice diagnosis of CVA and vascular dementia. This is a related admission per Hospice physician Dr. Jewel Baize. Patient was brought t the [facility] ED on 8/7 by her son following a fall at her care home. In  the ED xray revealed a left femoral neck fracture, orthopedics were consulted and patient underwent surgical repair today 8/8.  Met at bedside with patient's son Jeanell Sparrow and daughter in law Wells Guiles. Patient did not awaken during the visit. Per discussion with Ray prior to admission patient was able to ambulate with and with out a walker. She does need some stand by assist and also required assistance with ADLS. She was able to feed herself, but did need encouragement to eat. She is hard of hearing, does wear hearing aides and also has poor eye sight related to macular degeneration. Ray would like for his mother to go to short term rehab with hopes of her returning to her baseline. Writer discussed the process of PT evaluation and TOC involvement, insurance approval and the difference between short term rehab and long term care. Also discussed that the hospice benefit would be waived if patient was to go to rehab. Ray and Wells Guiles voiced understanding and were appreciative of the visit and information. Report exchanged with TOC and spoke with bedside RN.  VS: 122/49, 97, 16, 91 on room air I/O: 710/730 Abnormal labs:  NA 134, glucose 113, Ca 8.5, total protein 6.1, total Bilirubin 1.5 WBC 11.5, RBC 3.46, Hb 10.8, HCT 32.5  IV/PRN meds; Soduim chloride 75 ml/hr continuous Toradol 15 mg/lm dose 7.5 mg q 6 hrs IV Cefazolin 1 gm/50 ml Q 6 hrs  IV Hydromorphone  0.2-0.4 mg  IV q 4 hrs  PRN severe pain Oxycodone IR 2.$RemoveBefore'5mg'PuWhjvONykwNu$ -5 mg Q 4 hrs PRN severe pain Oxycodone IR 5-190 mg Q 4 hrs breakthrough pain.   Hospital problem list: Left  femoral neck fracture. Mechanical fall. Status post surgery. Patient currently sleepy, will continue some fluids as patient p.o. intake is not adequate yet.   #2.  Chronic diastolic congestive heart failure. Currently patient does not have any volume overload, more continue gentle rehydration.  Discharge planning: ongoing, son would be open to short term rehab if patient qualifies  Family contact: Son ray and Daughter in Special educational needs teacher at bedside.  IDG: updated  Goals of Care; Patient has an out of facility DNR in place  Medication list placed on patient's shadow chart.  Hospital Liaison team will continue to follow.  Flo Shanks BSN, RN , [place] [phone]

## 2020-12-22 NOTE — Plan of Care (Signed)
New admission to unit, alert and oriented x 2-3, complains of acute pain to left lower extremity and bilateral upper extremities status post fall at assisted living facility. Provided prn pain medications x 2, noted effective as evidence by patient calm and asleep. SCD's applied, vitals stable, no respiratory distress on room air. NPO at midnight. Will continue to monitor.  Problem: Education: Goal: Knowledge of General Education information will improve Description: Including pain rating scale, medication(s)/side effects and non-pharmacologic comfort measures Outcome: Progressing   Problem: Clinical Measurements: Goal: Cardiovascular complication will be avoided Outcome: Progressing   Problem: Nutrition: Goal: Adequate nutrition will be maintained Outcome: Progressing   Problem: Activity: Goal: Risk for activity intolerance will decrease Outcome: Progressing   Problem: Pain Managment: Goal: General experience of comfort will improve Outcome: Progressing   Problem: Elimination: Goal: Will not experience complications related to bowel motility Outcome: Progressing Goal: Will not experience complications related to urinary retention Outcome: Progressing   Problem: Coping: Goal: Level of anxiety will decrease Outcome: Progressing   Problem: Safety: Goal: Ability to remain free from injury will improve Outcome: Progressing   Problem: Skin Integrity: Goal: Risk for impaired skin integrity will decrease Outcome: Progressing

## 2020-12-22 NOTE — Progress Notes (Signed)
   12/22/20 2210 12/22/20 2215  Assess: MEWS Score  Temp 98.3 F (36.8 C)  --   BP (!) 96/40 (!) 102/45  Pulse Rate (!) 108 (!) 105  Resp (!) 22  --   SpO2 91 % 91 %  O2 Device Room [employer]

## 2020-12-22 NOTE — Progress Notes (Signed)
Cloudy urine noted, notified MD, MD advised to continue to monitor on coming nurse notified.

## 2020-12-23 ENCOUNTER — Encounter: Payer: Self-pay | Admitting: Orthopedic Surgery

## 2020-12-23 DIAGNOSIS — I5032 Chronic diastolic (congestive) heart failure: Secondary | ICD-10-CM | POA: Diagnosis not present

## 2020-12-23 DIAGNOSIS — S72002A Fracture of unspecified part of neck of left femur, initial encounter for closed fracture: Secondary | ICD-10-CM | POA: Diagnosis not present

## 2020-12-23 LAB — URINALYSIS, COMPLETE (UACMP) WITH MICROSCOPIC
Bacteria, UA: NONE SEEN
Bilirubin Urine: NEGATIVE
Glucose, UA: NEGATIVE mg/dL
Hgb urine dipstick: NEGATIVE
Ketones, ur: NEGATIVE mg/dL
Nitrite: NEGATIVE
Protein, ur: 30 mg/dL — AB
RBC / HPF: >50 RBC/hpf — ABNORMAL HIGH (ref 0–5)
Specific Gravity, Urine: 1.031 — ABNORMAL HIGH (ref 1.005–1.030)
WBC, UA: >50 WBC/hpf — ABNORMAL HIGH (ref 0–5)
pH: 5 (ref 5.0–8.0)

## 2020-12-23 LAB — BASIC METABOLIC PANEL
Anion gap: 8 (ref 5–15)
BUN: 21 mg/dL (ref 8–23)
CO2: 21 mmol/L — ABNORMAL LOW (ref 22–32)
Calcium: 7.5 mg/dL — ABNORMAL LOW (ref 8.9–10.3)
Chloride: 105 mmol/L (ref 98–111)
Creatinine, Ser: 0.95 mg/dL (ref 0.44–1.00)
GFR, Estimated: 55 mL/min — ABNORMAL LOW (ref >60)
Glucose, Bld: 105 mg/dL — ABNORMAL HIGH (ref 70–99)
Potassium: 4.6 mmol/L (ref 3.5–5.1)
Sodium: 134 mmol/L — ABNORMAL LOW (ref 135–145)

## 2020-12-23 LAB — VITAMIN B12: Vitamin B-12: 141 pg/mL — ABNORMAL LOW (ref 180–914)

## 2020-12-23 LAB — CBC
HCT: 26.2 % — ABNORMAL LOW (ref 36.0–46.0)
Hemoglobin: 8.6 g/dL — ABNORMAL LOW (ref 12.0–15.0)
MCH: 30.9 pg (ref 26.0–34.0)
MCHC: 32.8 g/dL (ref 30.0–36.0)
MCV: 94.2 fL (ref 80.0–100.0)
Platelets: 222 10*3/uL (ref 150–400)
RBC: 2.78 MIL/uL — ABNORMAL LOW (ref 3.87–5.11)
RDW: 13.9 % (ref 11.5–15.5)
WBC: 13.8 10*3/uL — ABNORMAL HIGH (ref 4.0–10.5)
nRBC: 0 % (ref 0.0–0.2)

## 2020-12-23 LAB — IRON AND TIBC
Iron: 14 ug/dL — ABNORMAL LOW (ref 28–170)
Saturation Ratios: 7 % — ABNORMAL LOW (ref 10.4–31.8)
TIBC: 211 ug/dL — ABNORMAL LOW (ref 250–450)
UIBC: 197 ug/dL

## 2020-12-23 MED ORDER — ENOXAPARIN SODIUM 30 MG/0.3ML IJ SOSY
30.0000 mg | PREFILLED_SYRINGE | INTRAMUSCULAR | Status: DC
  Administered 2020-12-23 – 2020-12-31 (×9): 30 mg via SUBCUTANEOUS
  Filled 2020-12-23 (×9): qty 0.3

## 2020-12-23 MED ORDER — TRAMADOL HCL 50 MG PO TABS: 50.0000 mg | ORAL_TABLET | Freq: Four times a day (QID) | ORAL | 0 refills | Status: AC | PRN

## 2020-12-23 MED ORDER — OXYCODONE HCL 5 MG PO TABS: 2.5000 mg | ORAL_TABLET | ORAL | 0 refills | Status: AC | PRN

## 2020-12-23 MED ORDER — ENSURE ENLIVE PO LIQD
237.0000 mL | Freq: Two times a day (BID) | ORAL | Status: DC
  Administered 2020-12-23 – 2020-12-29 (×12): 237 mL via ORAL

## 2020-12-23 MED ORDER — ENOXAPARIN SODIUM 40 MG/0.4ML IJ SOSY: 40.0000 mg | PREFILLED_SYRINGE | INTRAMUSCULAR | 0 refills | Status: AC

## 2020-12-23 NOTE — Progress Notes (Signed)
PROGRESS NOTE    Aimee Kirk  [id number] DOB: Jul 06, 1922 DOA: 12/21/2020 PCP: Orvis Brill, [facility]    Brief Narrative:  Aimee Kirk is a 87 y.o. female with medical history significant for advanced dementia, chronic diastolic heart failure, moderate mitral regurgitation, who is admitted to [facility] on 12/21/2020 with acute left femoral neck fracture after presenting from ALF to [facility] ED for evaluation of fall.  mages study showed left hip fracture.  Patient had surgery performed on 8/8.   Assessment & Plan:   Principal Problem:   Closed left hip fracture [facility]) Active Problems:   Fall   Protein calorie malnutrition (HCC)   Leukocytosis   Chronic diastolic CHF (congestive heart failure) ([place])   Essential hypertension  #1.  Left femoral neck fracture. Mechanical fall. Status post surgery. Patient doing well, spoke with the nurse, she was able to eat, but that she had a small appetite.  I will start Ensure. UA was sent out last night based on the urine color, I do not see any evidence of UTI.  Will not treat with antibiotics per  2.  Chronic diastolic congestive heart failure Condition stable   3. Dementia.  4.  Hyponatremia. Continue to follow-up  5.  Acute blood loss anemia. Check iron B12 level.  Recheck CBC tomorrow.   DVT prophylaxis: Lovenox Code Status: DNR Family Communication: Son updated Disposition Plan:    Status is: Inpatient  Remains inpatient appropriate because:Inpatient level of care appropriate due to severity of illness  Dispo: The patient is from: ALF              Anticipated d/c is to: SNF              Patient currently is not medically stable to d/c.   Difficult to place patient No        I/O last 3 completed shifts: In: 67 [I.V.:500; IV Piggyback:310] Out: 24 [Urine:730; Blood:150] Total I/O In: 0  Out: 90 [Urine:50]     Consultants:  Orthopedics  Procedures: Hip  Antimicrobials:  None  Subjective: Patient doing well today, she was able to eat when she was fed, no nausea vomiting abdominal pain. Pain under control. No fever or chills. No dysuria hematuria.  Objective: Vitals:   12/23/20 0155 12/23/20 0512 12/23/20 1010 12/23/20 1227  BP: (!) 106/53 (!) 114/58 (!) 116/55 116/60  Pulse: 94 100 (!) 109 96  Resp: '19 19 17 17  '$ Temp: 97.8 F (36.6 C) 98.1 F (36.7 C) 98 F (36.7 C) 97.8 F (36.6 C)  TempSrc:      SpO2: 93% (!) 87% 94% 93%  Weight:      Height:        Intake/Output Summary (Last 24 hours) at 12/23/2020 1357 Last data filed at 12/23/2020 1335 Gross per 24 hour  Intake 100 ml  Output 300 ml  Net -200 ml   Filed Weights   12/21/20 1600 12/22/20 0500  Weight: 45.4 kg 53.4 kg    Examination:  General exam: Appears calm and comfortable, frail. Respiratory system: Clear to auscultation. Respiratory effort normal. Cardiovascular system: S1 & S2 heard, RRR. No JVD, murmurs, rubs, gallops or clicks. No pedal edema. Gastrointestinal system: Abdomen is nondistended, soft and nontender. No organomegaly or masses felt. Normal bowel sounds heard. Central nervous system: Alert and oriented x1. No focal neurological deficits. Extremities: Symmetric 5 x 5 power. Skin: No rashes, lesions or ulcers Psychiatry: Mood & affect appropriate.  Data Reviewed: I have personally reviewed following labs and imaging studies  CBC: Recent Labs  Lab 12/21/20 1735 12/22/20 0421 12/23/20 0408  WBC 21.2* 11.9* 13.8*  NEUTROABS 19.5* 9.9*  --   HGB 12.2 10.8* 8.6*  HCT 35.9* 32.5* 26.2*  MCV 92.1 93.9 94.2  PLT 275 259 [id number]   Basic Metabolic Panel: Recent Labs  Lab 12/21/20 1735 12/21/20 1935 12/22/20 0421 12/23/20 0408  NA 134*  --  134* 134*  K 4.3  --  4.2 4.6  CL 100  --  101 105  CO2 27  --  28 21*  GLUCOSE 143*  --  113* 105*  BUN 19  --  18 21  CREATININE 0.93  --  0.82 0.95  CALCIUM 9.0  --  8.5* 7.5*  MG  --  1.9 2.3  --     GFR: Estimated Creatinine Clearance: 27.4 mL/min (by C-G formula based on SCr of 0.95 mg/dL). Liver Function Tests: Recent Labs  Lab 12/21/20 1735 12/22/20 0421  AST 36 29  ALT 23 21  ALKPHOS 108 91  BILITOT 1.2 1.5*  PROT 6.9 6.1*  ALBUMIN 4.1 3.5   No results for input(s): LIPASE, AMYLASE in the last 168 hours. No results for input(s): AMMONIA in the last 168 hours. Coagulation Profile: Recent Labs  Lab 12/22/20 0421  INR 1.0   Cardiac Enzymes: No results for input(s): CKTOTAL, CKMB, CKMBINDEX, TROPONINI in the last 168 hours. BNP (last 3 results) No results for input(s): PROBNP in the last 8760 hours. HbA1C: No results for input(s): HGBA1C in the last 72 hours. CBG: No results for input(s): GLUCAP in the last 168 hours. Lipid Profile: No results for input(s): CHOL, HDL, LDLCALC, TRIG, CHOLHDL, LDLDIRECT in the last 72 hours. Thyroid Function Tests: No results for input(s): TSH, T4TOTAL, FREET4, T3FREE, THYROIDAB in the last 72 hours. Anemia Panel: No results for input(s): VITAMINB12, FOLATE, FERRITIN, TIBC, IRON, RETICCTPCT in the last 72 hours. Sepsis Labs: No results for input(s): PROCALCITON, LATICACIDVEN in the last 168 hours.  Recent Results (from the past 240 hour(s))  Resp Panel by RT-PCR (Flu A&B, Covid) Nasopharyngeal Swab     Status: None   Collection Time: 12/21/20  5:35 PM   Specimen: Nasopharyngeal Swab; Nasopharyngeal(NP) swabs in vial transport medium  Result Value Ref Range Status   SARS Coronavirus 2 by RT PCR NEGATIVE NEGATIVE Final    Comment: (NOTE) SARS-CoV-2 target nucleic acids are NOT DETECTED.  The SARS-CoV-2 RNA is generally detectable in upper respiratory specimens during the acute phase of infection. The lowest concentration of SARS-CoV-2 viral copies this assay can detect is 138 copies/mL. A negative result does not preclude SARS-Cov-2 infection and should not be used as the sole basis for treatment or other patient management  decisions. A negative result may occur with  improper specimen collection/handling, submission of specimen other than nasopharyngeal swab, presence of viral mutation(s) within the areas targeted by this assay, and inadequate number of viral copies(<138 copies/mL). A negative result must be combined with clinical observations, patient history, and epidemiological information. The expected result is Negative.  Fact Sheet for Patients:  EntrepreneurPulse.com.au  Fact Sheet for Healthcare Providers:  IncredibleEmployment.be  This test is no t yet approved or cleared by the [place] FDA and  has been authorized for detection and/or diagnosis of SARS-CoV-2 by FDA under an Emergency Use Authorization (EUA). This EUA will remain  in effect (meaning this test can be used) for the duration of the COVID-19  declaration under Section 564(b)(1) of the Act, 21 U.S.C.section 360bbb-3(b)(1), unless the authorization is terminated  or revoked sooner.       Influenza A by PCR NEGATIVE NEGATIVE Final   Influenza B by PCR NEGATIVE NEGATIVE Final    Comment: (NOTE) The Xpert Xpress SARS-CoV-2/FLU/RSV plus assay is intended as an aid in the diagnosis of influenza from Nasopharyngeal swab specimens and should not be used as a sole basis for treatment. Nasal washings and aspirates are unacceptable for Xpert Xpress SARS-CoV-2/FLU/RSV testing.  Fact Sheet for Patients: EntrepreneurPulse.com.au  Fact Sheet for Healthcare Providers: IncredibleEmployment.be  This test is not yet approved or cleared by the [place] FDA and has been authorized for detection and/or diagnosis of SARS-CoV-2 by FDA under an Emergency Use Authorization (EUA). This EUA will remain in effect (meaning this test can be used) for the duration of the COVID-19 declaration under Section 564(b)(1) of the Act, 21 U.S.C. section 360bbb-3(b)(1), unless the  authorization is terminated or revoked.  Performed at [facility], [address] [place], [place] [address]          Radiology Studies: DG Chest 1 View  Result Date: 12/21/2020 CLINICAL DATA:  Pain after falling. EXAM: CHEST  1 VIEW COMPARISON:  03/05/2020 FINDINGS: Heart is enlarged and stable in configuration. Stable elevation of the LEFT hemidiaphragm. Lungs are clear. No pneumothorax. No acute displaced fractures. Chronic changes in both shoulders, RIGHT greater than LEFT. IMPRESSION: Stable cardiomegaly.  No evidence for acute  abnormality. Electronically Signed   By: Nolon Nations M.D.   On: 12/21/2020 16:36   CT HEAD WO CONTRAST (5MM)  Result Date: 12/21/2020 CLINICAL DATA:  Mental status change. EXAM: CT HEAD WITHOUT CONTRAST TECHNIQUE: Contiguous axial images were obtained from the base of the skull through the vertex without intravenous contrast. COMPARISON:  03/05/2020 FINDINGS: Brain: Stable age related cerebral atrophy, ventriculomegaly and periventricular white matter disease. No extra-axial fluid collections are identified. No CT findings for acute hemispheric infarction or intracranial hemorrhage. No mass lesions. The brainstem and cerebellum are normal. Vascular: Stable vascular calcifications. No aneurysm or hyperdense vessels. Skull: No skull fracture or bone lesions. Sinuses/Orbits: Paranasal sinuses and mastoid air cells are clear. The globes are intact. Other: No scalp lesions or scalp hematoma. IMPRESSION: 1. Stable age related cerebral atrophy, ventriculomegaly and periventricular white matter disease. 2. No acute intracranial findings or mass lesions. Electronically Signed   By: Marijo Sanes M.D.   On: 12/21/2020 16:37   DG Pelvis Portable  Result Date: 12/22/2020 CLINICAL DATA:  Postop left hip arthroplasty. EXAM: PORTABLE PELVIS 1-2 VIEWS COMPARISON:  Preoperative radiograph yesterday. FINDINGS: Left hip arthroplasty in expected alignment. There is no  periprosthetic lucency or fracture. Recent postsurgical change includes air and edema in the soft tissues as well as lateral skin staples. The bones are under mineralized IMPRESSION: Left hip arthroplasty without immediate postoperative complication. Electronically Signed   By: Keith Rake M.D.   On: 12/22/2020 15:04   DG Pelvis Portable  Result Date: 12/22/2020 CLINICAL DATA:  Elective surgery.  Intraop. EXAM: PORTABLE PELVIS 1-2 VIEWS COMPARISON:  Preoperative radiograph yesterday. FINDINGS: AP view of the pelvis obtained in the operating room during left hip arthroplasty. Femoral component is in place. IMPRESSION: Intraoperative radiograph during left hip arthroplasty. Electronically Signed   By: Keith Rake M.D.   On: 12/22/2020 15:04   DG Pelvis Portable  Result Date: 12/22/2020 CLINICAL DATA:  Elective surgery. EXAM: PORTABLE PELVIS 1-2 VIEWS COMPARISON:  Pelvis and hip  radiograph yesterday. FINDINGS: AP view of the pelvis obtained in the operating room during left hip arthroplasty. Femoral spacer is in place. IMPRESSION: Intraoperative radiograph during left hip arthroplasty. Electronically Signed   By: Keith Rake M.D.   On: 12/22/2020 15:03   DG Hip Unilat With Pelvis 2-3 Views Left  Result Date: 12/21/2020 CLINICAL DATA:  Pain after falling. EXAM: DG HIP (WITH OR WITHOUT PELVIS) 2-3V LEFT COMPARISON:  03/05/2020 FINDINGS: There is acute fracture of the LEFT femoral neck, associated with foreshortening and varus angulation. There is no evidence for dislocation or subluxation of the femoral head. The limited view is somewhat limited evaluation of location of the femoral head. Remainder of the pelvis is intact. Degenerative changes are seen in the LOWER lumbar spine and both hips. Significant stool burden incidentally noted. IMPRESSION: Acute fracture of the LEFT femoral neck. Limited evaluation for dislocation without evidence for dislocation. Significant stool burden. Electronically  Signed   By: Nolon Nations M.D.   On: 12/21/2020 16:35        Scheduled Meds:  acetaminophen  1,000 mg Oral Q8H   Chlorhexidine Gluconate Cloth  6 each Topical Daily   docusate sodium  100 mg Oral BID   enoxaparin (LOVENOX) injection  30 mg Subcutaneous Q24H   feeding supplement  237 mL Oral BID BM   metoprolol tartrate  50 mg Oral BID   Continuous Infusions:   LOS: 2 days    Time spent: 27 minutes    Sharen Hones, MD [facility]   To contact the attending provider between 7A-7P or the covering provider during after hours 7P-7A, please log into the web site [email or web address] and access using universal [place] password for that web site. If you do not have the password, please call the hospital operator.  12/23/2020, 1:57 PM

## 2020-12-23 NOTE — Progress Notes (Signed)
   12/22/20 2050  Assess: MEWS Score  Temp 98.7 F (37.1 C)  BP (!) 119/47  Pulse Rate (!) 108  Resp 18  SpO2 91 %  O2 Device Room Air  Assess: MEWS Score  MEWS Temp 0  MEWS Systolic 0  MEWS Pulse 1  MEWS RR 0  MEWS LOC 1  MEWS Score 2  MEWS Score Color Yellow  Assess: if the MEWS score is Yellow or Red  Were vital signs taken at a resting state? Yes  Focused Assessment No change from prior assessment  Does the patient meet 2 or more of the SIRS criteria? No  MEWS guidelines implemented *See Row Information* Yes  Treat  MEWS Interventions Administered prn meds/treatments  Pain Scale PAINAD  Breathing 0  Negative Vocalization 0  Facial Expression 2  Body Language 0  Consolability 0  PAINAD Score 2  Take Vital Signs  Increase Vital Sign Frequency  Yellow: Q 2hr X 2 then Q 4hr X 2, if remains yellow, continue Q 4hrs  Escalate  MEWS: Escalate Yellow: discuss with charge nurse/RN and consider discussing with provider and RRT  Notify: Charge Nurse/RN  Name of Charge Nurse/RN Notified Olivia, RN  Date Charge Nurse/RN Notified 12/22/20  Time Charge Nurse/RN Notified 2120  Notify: Provider  Provider Name/Title Damita Dunnings, MD  Date Provider Notified 12/22/20  Time Provider Notified 2216  Notification Type Page  Notification Reason Change in status  Provider response See new orders  Date of Provider Response 12/22/20  Document  Patient Outcome Not stable and remains on department  Progress note created (see row info) Yes  Assess: SIRS CRITERIA  SIRS Temperature  0  SIRS Pulse 1  SIRS Respirations  0  SIRS WBC 0  SIRS Score Sum  1  Inserted for [employer] LPN

## 2020-12-23 NOTE — Plan of Care (Signed)

## 2020-12-23 NOTE — NC FL2 (Signed)
[place] LEVEL OF CARE SCREENING TOOL     IDENTIFICATION  Patient Name: Aimee Kirk Birthdate: 08-Jun-1922 Sex: female Admission Date (Current Location): 12/21/2020  [facility] and [place] Number:  Engineering geologist and Address:  [facility], [address], [place], [place] [address]      Provider Number: [id number]  Attending Physician Name and Address:  Sharen Hones, MD  Relative Name and Phone Number:  Jeanell Sparrow, son, [phone]    Current Level of Care: Hospital Recommended Level of Care: [place] Prior Approval Number:    Date Approved/Denied:   PASRR Number: [id number]  Discharge Plan: SNF    Current Diagnoses: Patient Active Problem List   Diagnosis Date Noted   Closed left hip fracture ([place]) 12/21/2020   Protein calorie malnutrition ([place]) 12/21/2020   Leukocytosis 12/21/2020   Chronic diastolic CHF (congestive heart failure) ([place]) 12/21/2020   Essential hypertension 12/21/2020   Abnormal CK    NSTEMI (non-ST elevated myocardial infarction) [facility])    Traumatic rhabdomyolysis ([place])    ACS (acute coronary syndrome) ([place]) 03/05/2020   Sepsis without acute organ dysfunction ([place])    Fall    Positive D dimer    Lactic acidemia    Dementia without behavioral disturbance ([place])     Orientation RESPIRATION BLADDER Height & Weight     Self  Normal Continent, External catheter Weight: 53.4 kg Height:  '5\' 2"'$  (157.5 cm)  BEHAVIORAL SYMPTOMS/MOOD NEUROLOGICAL BOWEL NUTRITION STATUS      Continent Diet (Regular)  AMBULATORY STATUS COMMUNICATION OF NEEDS Skin   Extensive Assist Verbally Surgical wounds                       Personal Care Assistance Level of Assistance  Bathing, Feeding, Dressing, Total care Bathing Assistance: Limited assistance Feeding assistance: Independent Dressing Assistance: Limited assistance Total Care Assistance: Limited assistance   Functional Limitations Info  Hearing,  Sight Sight Info: Impaired Hearing Info: Impaired      SPECIAL CARE FACTORS FREQUENCY  OT (By licensed OT), PT (By licensed PT)     PT Frequency: 5 times per week OT Frequency: 3 times per week            Contractures Contractures Info: Not present    Additional Factors Info  Code Status, Allergies Code Status Info: DNR Allergies Info: Remdesivir           Current Medications (12/23/2020):  This is the current hospital active medication list Current Facility-Administered Medications  Medication Dose Route Frequency Provider Last Rate Last Admin   0.9 %  sodium chloride infusion   Intravenous Continuous Leim Fabry, MD 75 mL/hr at 12/23/20 0545 New Bag at 12/23/20 0545   acetaminophen (TYLENOL) tablet 1,000 mg  1,000 mg Oral Q8H Leim Fabry, MD   1,000 mg at 12/23/20 0645   bisacodyl (DULCOLAX) suppository 10 mg  10 mg Rectal Daily PRN Leim Fabry, MD       Chlorhexidine Gluconate Cloth 2 % PADS 6 each  6 each Topical Daily Sharen Hones, MD   6 each at 12/22/20 1742   docusate sodium (COLACE) capsule 100 mg  100 mg Oral BID Leim Fabry, MD   100 mg at 12/22/20 2124   enoxaparin (LOVENOX) injection 30 mg  30 mg Subcutaneous Q24H Darnelle Bos, [facility]       HYDROmorphone (DILAUDID) injection 0.2-0.4 mg  0.2-0.4 mg Intravenous Q4H PRN Leim Fabry, MD  ketorolac (TORADOL) 15 MG/ML injection 7.5 mg  7.5 mg Intravenous Q6H Leim Fabry, MD   7.5 mg at 12/23/20 [id number]   menthol-cetylpyridinium (CEPACOL) lozenge 3 mg  1 lozenge Oral PRN Leim Fabry, MD       Or   phenol (CHLORASEPTIC) mouth spray 1 spray  1 spray Mouth/Throat PRN Leim Fabry, MD       metoCLOPramide (REGLAN) tablet 5-10 mg  5-10 mg Oral Q8H PRN Leim Fabry, MD       Or   metoCLOPramide (REGLAN) injection 5-10 mg  5-10 mg Intravenous Q8H PRN Leim Fabry, MD       metoprolol tartrate (LOPRESSOR) tablet 50 mg  50 mg Oral BID Leim Fabry, MD       ondansetron [facility]) tablet 4 mg  4 mg Oral Q6H PRN Leim Fabry, MD       Or   ondansetron [facility]) injection 4 mg  4 mg Intravenous Q6H PRN Leim Fabry, MD       oxyCODONE (Oxy IR/ROXICODONE) immediate release tablet 2.5-5 mg  2.5-5 mg Oral Q4H PRN Leim Fabry, MD       oxyCODONE (Oxy IR/ROXICODONE) immediate release tablet 5-10 mg  5-10 mg Oral Q4H PRN Leim Fabry, MD       senna-docusate (Senokot-S) tablet 1 tablet  1 tablet Oral QHS PRN Leim Fabry, MD       sodium phosphate (FLEET) 7-19 GM/118ML enema 1 enema  1 enema Rectal Once PRN Leim Fabry, MD       traMADol Veatrice [facility]bon) tablet 50 mg  50 mg Oral Q6H PRN Leim Fabry, MD   50 mg at 12/22/20 2125     Discharge Medications: Please see discharge summary for a list of discharge medications.  Relevant Imaging Results:  Relevant Lab Results:   Additional Information SSN;[id number]  Su Hilt, RN

## 2020-12-23 NOTE — Progress Notes (Signed)
  Subjective: 1 Day Post-Op Procedure(s) (LRB): ARTHROPLASTY BIPOLAR HIP (HEMIARTHROPLASTY) (Left) Patient reports pain as mild.   Patient is well, and has had no acute complaints or problems Plan is to go Skilled nursing facility after hospital stay. Negative for chest pain and shortness of breath Fever: no Gastrointestinal: Negative for nausea and vomiting  Objective: Vital signs in last 24 hours: Temp:  [97.2 F (36.2 C)-98.7 F (37.1 C)] 98.1 F (36.7 C) (08/09 0512) Pulse Rate:  [76-108] 100 (08/09 0512) Resp:  [15-23] 19 (08/09 0512) BP: (90-146)/(40-70) 114/58 (08/09 0512) SpO2:  [87 %-100 %] 87 % (08/09 0512)  Intake/Output from previous day:  Intake/Output Summary (Last 24 hours) at 12/23/2020 0705 Last data filed at 12/23/2020 0500 Gross per 24 hour  Intake 810 ml  Output 880 ml  Net -70 ml    Intake/Output this shift: No intake/output data recorded.  Labs: Recent Labs    12/21/20 1735 12/22/20 0421 12/23/20 0408  HGB 12.2 10.8* 8.6*   Recent Labs    12/22/20 0421 12/23/20 0408  WBC 11.9* 13.8*  RBC 3.46* 2.78*  HCT 32.5* 26.2*  PLT 259 222   Recent Labs    12/22/20 0421 12/23/20 0408  NA 134* 134*  K 4.2 4.6  CL 101 105  CO2 28 21*  BUN 18 21  CREATININE 0.82 0.95  GLUCOSE 113* 105*  CALCIUM 8.5* 7.5*   Recent Labs    12/22/20 0421  INR 1.0     EXAM General - Patient is Alert and Confused Extremity - Neurovascular intact Dorsiflexion/Plantar flexion intact Compartment soft Dressing/Incision - clean, dry, no drainage Motor Function - intact, moving foot and toes well on exam.   Past Medical History:  Diagnosis Date   Dementia ([place])    Thyroid disease     Assessment/Plan: 1 Day Post-Op Procedure(s) (LRB): ARTHROPLASTY BIPOLAR HIP (HEMIARTHROPLASTY) (Left) Principal Problem:   Closed left hip fracture (H[facility]) Active Problems:   Fall   Protein calorie malnutrition (H[facility])   Leukocytosis   Chronic diastolic CHF (congestive  heart failure) (H[facility])   Essential hypertension  Estimated body mass index is 21.53 kg/m as calculated from the following:   Height as of this encounter: '5\' 2"'$  (1.575 m).   Weight as of this encounter: 53.4 kg. Advance diet Up with therapy  Care management with discharge planning  Follow-up with [facility] orthopedics in 2 weeks for staple removal and x-rays of the left hip.  DVT Prophylaxis - Lovenox, Foot Pumps, and TED hose Weight-Bearing as tolerated to left leg  Reche Dixon, PA-C Orthopaedic Surgery 12/23/2020, 7:05 AM

## 2020-12-23 NOTE — Evaluation (Signed)
Occupational Therapy Evaluation Patient Details Name: Aimee Kirk MRN: [id number] DOB: [birth year] Today's Date: 12/23/2020    History of Present Illness As per MD H and P: Aimee Kirk is a 87 y.o. female with medical history significant for advanced dementia, chronic diastolic heart failure, moderate mitral regurgitation, who is admitted to [facility] on 12/21/2020 with acute left femoral neck fracture after presenting from ALF to [facility] ED for evaluation of fall.  Per these reports, the patient was bending over to tie her shoe, when she fell forward, landing on her left side, including left hip, as the principal point of contact with the floor. No other complaints. Pt is now S/P L hemiarthroplasty, WBAT to LLE.   Clinical Impression   Patient presenting with decreased I in self care, balance, functional mobility/transfer, endurance, and safety awareness. Patient lives at family care home ALF PTA. No family present at time of evaluation. Per chart review, pt ambulates with RW independently at baseline, needs assist with self care, and can self feed herself with encouragement. She does have dementia at baseline and is very confused this session. Oriented to self only nit agreeable. Patient currently functioning at mod A overall with increased time and cuing to initiate tasks. Pt assisted back to bed and abductor pillow placed secondary to pt unable to follow precautions due to inability to recall new information. Patient will benefit from acute OT to increase overall independence in the areas of ADLs, functional mobility, and safety awareness in order to safely discharge to next venue of care.     Follow Up Recommendations  SNF;Supervision/Assistance - 24 hour    Equipment Recommendations  Other (comment) (defer to next venue of care)       Precautions / Restrictions Precautions Precautions: Posterior Hip Precaution Booklet Issued: Yes (comment) Precaution Comments: Pt  is pleasantly confused and unable to recall new information.  Abductor pillow used when in bed. Restrictions Weight Bearing Restrictions: Yes RLE Weight Bearing: Weight bearing as tolerated LLE Weight Bearing: Weight bearing as tolerated      Mobility Bed Mobility Overal bed mobility: Needs Assistance Bed Mobility: Sit to Supine   Sidelying to sit: Max assist   Sit to supine: Max assist   General bed mobility comments: max A for B LEs and trunk secondary to precautions and pt fearful of falling    Transfers Overall transfer level: Needs assistance Equipment used: 1 person hand held assist Transfers: Sit to/from [facility] Sit to Stand: Mod assist Stand pivot transfers: Mod assist       General transfer comment: use of hand held assistance for side steps from recliner chair to bed    Balance Overall balance assessment: Needs assistance Sitting-balance support: Bilateral upper extremity supported;Feet supported Sitting balance-Leahy Scale: Fair     Standing balance support: Bilateral upper extremity supported Standing balance-Leahy Scale: Poor                             ADL either performed or assessed with clinical judgement   ADL Overall ADL's : Needs assistance/impaired     Grooming: Wash/dry hands;Wash/dry face;Sitting;Set up Grooming Details (indicate cue type and reason): cuing to initiate task                 Toilet Transfer: BSC;RW;Moderate assistance Toilet Transfer Details (indicate cue type and reason): simulated transfer Toileting- Clothing Manipulation and Hygiene: Maximal assistance  Functional mobility during ADLs: Moderate assistance;Maximal assistance;Rolling walker       Vision Patient Visual Report: No change from baseline              Pertinent Vitals/Pain Pain Assessment: Faces Faces Pain Scale: Hurts little more Pain Location: L hip Pain Descriptors / Indicators: Aching;Discomfort Pain  Intervention(s): Limited activity within patient's tolerance;Monitored during session;Repositioned;Premedicated before session     Hand Dominance Right   Extremity/Trunk Assessment Upper Extremity Assessment Upper Extremity Assessment: Generalized weakness   Lower Extremity Assessment Lower Extremity Assessment: Defer to PT evaluation LLE Deficits / Details: 3-/5 MMT       Communication Communication Communication: HOH   Cognition Arousal/Alertness: Awake/alert Behavior During Therapy: Restless Overall Cognitive Status: History of cognitive impairments - at baseline                                 General Comments: hx of dementia. Pt is very confused this session and asking for her mother. Pt oriented to self only. she follows 1 steps commands with mod multimodal cuing and increased time to process. She is restless.              Home Living Family/patient expects to be discharged to:: Assisted living                             Home Equipment: Gilford Rile - 2 wheels;Shower seat;Grab bars - toilet;Toilet riser;Hospital bed;Wheelchair - manual   Additional Comments: Pt is poor historian. Information obtained via chart review and staff information.      Prior Functioning/Environment Level of Independence: Needs assistance  Gait / Transfers Assistance Needed: independent in the ALF hallways. ADL's / Homemaking Assistance Needed: assist with self care at baseline. Pt able to self feed but needing encouragement for oral intake with set up A.            OT Problem List: Decreased strength;Cardiopulmonary status limiting activity;Pain;Decreased range of motion;Decreased cognition;Impaired sensation;Increased edema;Decreased activity tolerance;Decreased safety awareness;Impaired balance (sitting and/or standing);Decreased knowledge of use of DME or AE;Decreased knowledge of precautions      OT Treatment/Interventions: Self-care/ADL  training;Modalities;Balance training;Therapeutic exercise;Therapeutic activities;Energy conservation;Cognitive remediation/compensation;DME and/or AE instruction;Patient/family education;Manual therapy    OT Goals(Current goals can be found in the care plan section) Acute Rehab OT Goals Patient Stated Goal: "help me find my mom" OT Goal Formulation: With patient Time For Goal Achievement: 01/06/21 Potential to Achieve Goals: Good ADL Goals Pt Will Perform Grooming: with supervision;sitting Pt Will Perform Lower Body Dressing: with min assist;sit to/from stand Pt Will Transfer to Toilet: with min assist;bedside commode Pt Will Perform Toileting - Clothing Manipulation and hygiene: with min assist;sit to/from stand  OT Frequency: Min 2X/week   Barriers to D/C:    none known at this time          AM-PAC OT "6 Clicks" Daily Activity     Outcome Measure Help from another person eating meals?: A Little Help from another person taking care of personal grooming?: A Little Help from another person toileting, which includes using toliet, bedpan, or urinal?: A Lot Help from another person bathing (including washing, rinsing, drying)?: A Lot Help from another person to put on and taking off regular upper body clothing?: A Little Help from another person to put on and taking off regular lower body clothing?: A Lot 6 Click Score: 15  End of Session Equipment Utilized During Treatment: Oxygen Nurse Communication: Mobility status;Precautions  Activity Tolerance: Patient tolerated treatment well Patient left: in bed;with call bell/phone within reach;with bed alarm set  OT Visit Diagnosis: Unsteadiness on feet (R26.81);Repeated falls (R29.6);Muscle weakness (generalized) (M62.81);History of falling (Z91.81)                Time: 1320-1340 OT Time Calculation (min): 20 min Charges:  OT General Charges $OT Visit: 1 Visit OT Evaluation $OT Eval Low Complexity: 1 Low OT Treatments $Therapeutic  Activity: 8-22 mins  Darleen Crocker, MS, OTR/L , CBIS ascom [phone]  12/23/20, 2:22 PM

## 2020-12-23 NOTE — Progress Notes (Addendum)
[facility]) Hospital Liaison RN Note:   This patient is currently followed by [facility] hospice services at [facility]. She has a hospice diagnosis of CVA and vascular dementia. This is a related admission per Hospice physician Dr. Jewel Baize. Patient was brought t the [facility] ED on 8/7 by her son following a fall at her care home. In the ED xray revealed a left femoral neck fracture, orthopedics were consulted and patient underwent surgical repair 8/8.  Visited patient at bedside. Wells Guiles, daughter in law present. Patient sleeping, but awoke during visit. Denies pain at present. Per nurse, patient had pain earlier requiring IV dilaudid. She has been removing her O2 intermittently due to confusion, causing O2 sats to drop in the 80s at times. ROM exercises performed with PT today. Awaiting decision related to short term rehab. Will continue to follow through discharge disposition.     Patient remains inpatient appropriate related to post op care s/p surgery on 8.8.22 and administration of IV antibiotics.   VS: T-97.8, HR-96, RR-17, BP-116/60, O2 sats - 93% on 2L O2  I/O: 810/880  Abnormal labs: Sodium: 134 (L) CO2: 21 (L) Glucose: 105 (H) Calcium: 7.5 (L) GFR, Estimated: 55 (L) WBC: 13.8 (H) RBC: 2.78 (L) Hemoglobin: 8.6 (L) HCT: 26.2 (L)  Diagnostics: CLINICAL DATA:  Postop left hip arthroplasty.   EXAM: PORTABLE PELVIS 1-2 VIEWS   COMPARISON:  Preoperative radiograph yesterday.   FINDINGS: Left hip arthroplasty in expected alignment. There is no periprosthetic lucency or fracture. Recent postsurgical change includes air and edema in the soft tissues as well as lateral skin staples. The bones are under mineralized   IMPRESSION: Left hip arthroplasty without immediate postoperative complication.    IV/PRN meds: Cefazolin 1 gm IV every 6 hrs, Dilaudid 0.4 mg IV x 1, tramadol 50 mg x 1    Problem List:  Principal  Problem:   Closed left hip fracture [facility]) Active Problems:   Fall   Protein calorie malnutrition ([place])   Leukocytosis   Chronic diastolic CHF (congestive heart failure) ([place])   Essential hypertension   #1.  Left femoral neck fracture. Mechanical fall. Status post surgery. Patient doing well, spoke with the nurse, she was able to eat, but that she had a small appetite.  I will start Ensure. UA was sent out last night based on the urine color, I do not see any evidence of UTI.  Will not treat with antibiotics per  2.  Chronic diastolic congestive heart failure Condition stable   3. Dementia.   4.  Hyponatremia. Continue to follow-up  5.  Acute blood loss anemia. Check iron B12 level.  Recheck CBC tomorrow.  Discharge planning: ongoing, son would be open to short term rehab if patient qualifies   Family contact: Spoke with daughter in Sports coach, Wells Guiles at bedside   IDT: updated   Goals of Care: Patient has an out of facility DNR in place   Transfer summary placed on patient's shadow chart.   Please do not hesitate to call with any hospice related questions or concerns.   Thank you,   Bobbie "Loren Racer, [place], BSN [facility] Liaison [phone]

## 2020-12-23 NOTE — TOC Progression Note (Signed)
Transition of Care [facility]) - Progression Note    Patient Details  Name: Aimee Kirk MRN: [id number] Date of Birth: [birth year]  Transition of Care [facility]) CM/SW [place], RN Phone Number: 12/23/2020, 2:20 PM  Clinical Narrative:    Reached out to the local facilities and requested them to take a look at the patient for a bed offer, awaiting offers        Expected Discharge Plan and Services                                                 Social Determinants of Health (SDOH) Interventions    Readmission Risk Interventions No flowsheet data found.

## 2020-12-23 NOTE — Progress Notes (Signed)
PHARMACIST - PHYSICIAN COMMUNICATION  CONCERNING:  Enoxaparin (Lovenox) for DVT Prophylaxis   DESCRIPTION: Patient was prescribed enoxaprin '40mg'$  q24 hours for VTE prophylaxis.   Filed Weights   12/21/20 1600 12/22/20 0500  Weight: 45.4 kg (100 lb) 53.4 kg (117 lb 11.6 oz)    Body mass index is 21.53 kg/m.  Estimated Creatinine Clearance: 27.4 mL/min (by C-G formula based on SCr of 0.95 mg/dL).  Patient is candidate for enoxaparin '30mg'$  every 24 hours based on CrCl <22m/min or Weight <45kg  RECOMMENDATION: Pharmacy has [facility].  Patient is now receiving enoxaparin 30 mg every 24 hours    MDarnelle Bos PharmD Clinical Pharmacist  12/23/2020 9:03 AM

## 2020-12-23 NOTE — Progress Notes (Signed)
Physical Therapy Treatment Patient Details Name: Aimee Kirk MRN: [id number] DOB: 02-11-1923 Today's Date: 12/23/2020    History of Present Illness As per MD H and P: Aimee Kirk is a 87 y.o. female with medical history significant for advanced dementia, chronic diastolic heart failure, moderate mitral regurgitation, who is admitted to [facility] on 12/21/2020 with acute left femoral neck fracture after presenting from ALF to [facility] ED for evaluation of fall.  Per these reports, the patient was bending over to tie her shoe, when she fell forward, landing on her left side, including left hip, as the principal point of contact with the floor. No other complaints. Pt is now S/P L hemiarthroplasty, WBAT to LLE.    PT Comments    Patient in bed with O2 Cannula not in place. SPO2 84%. With [place] in place at 2L, pt's SPO2 >90% at all time. Pt thinks her [place] is her glasses and constantly removes it to place in her eyes. Pt performed AROM to BLE 2 x 10 reps each including Ankle pumps, SLR, Heel slides, Gluet bridges. Rolling to R. Pt needs Max VC for proper technique and to stay on task. Pt tol tx well. Nurse aware of mobility and pt needs an object to hold to keep the [place] in place as pt's SPO2 drops without O2 supplement.   Follow Up Recommendations  SNF     Equipment Recommendations       Recommendations for Other Services       Precautions / Restrictions Precautions Precautions: Posterior Hip Precaution Booklet Issued: Yes (comment) Precaution Comments: Pt is pleasantly confused and unable to recall new information.  Abductor pillow used when in bed. Restrictions Weight Bearing Restrictions: No RLE Weight Bearing: Weight bearing as tolerated LLE Weight Bearing: Weight bearing as tolerated    Mobility  Bed Mobility Overal bed mobility: Needs Assistance Bed Mobility: Rolling Rolling: Max assist;+2 for physical assistance Sidelying to sit: Max assist   Sit to  supine: Max assist   General bed mobility comments: max A for B LEs and trunk secondary to precautions and pt fearful of falling    Transfers Overall transfer level: Needs assistance Equipment used: 1 person hand held assist Transfers: Sit to/from [facility] Sit to Stand: Mod assist Stand pivot transfers: Mod assist       General transfer comment: use of hand held assistance for side steps from recliner chair to bed  Ambulation/Gait         Gait velocity: decreased/freezes due to decreased loading response to LLE.       Stairs             Wheelchair Mobility    Modified Rankin (Stroke Patients Only)       Balance Overall balance assessment: Needs assistance Sitting-balance support: Bilateral upper extremity supported;Feet supported Sitting balance-Leahy Scale: Fair     Standing balance support: Bilateral upper extremity supported Standing balance-Leahy Scale: Poor                              Cognition Arousal/Alertness: Awake/alert Behavior During Therapy: Restless Overall Cognitive Status: History of cognitive impairments - at baseline                                 General Comments: hx of dementia. Pt is very confused tryinmg to take her O2 tube  out thinking its her eye glasses. Cont. to follow 1 steps commands      Exercises General Exercises - Lower Extremity Ankle Circles/Pumps: AROM;AAROM;20 reps;Supine Gluteal Sets: AROM;20 reps;Supine Heel Slides: AROM;20 reps;Supine Hip Flexion/Marching: AAROM;20 reps;Supine (Bridges 2 x 10 reps with Max VC to remain on task for all exs.)    General Comments        Pertinent Vitals/Pain Pain Assessment: No/denies pain Faces Pain Scale: Hurts little more Pain Location: L hip Pain Descriptors / Indicators: Aching;Discomfort Pain Intervention(s): Limited activity within patient's tolerance;Monitored during session;Repositioned;Premedicated before session     Home Living Family/patient expects to be discharged to:: Assisted living             Home Equipment: Walker - 2 wheels;Shower seat;Grab bars - toilet;Toilet riser;Hospital bed;Wheelchair - manual Additional Comments: Pt is poor historian. Information obtained via chart review and staff information.    Prior Function Level of Independence: Needs assistance  Gait / Transfers Assistance Needed: independent in the ALF hallways. ADL's / Homemaking Assistance Needed: assist with self care at baseline. Pt able to self feed but needing encouragement for oral intake with set up A.     PT Goals (current goals can now be found in the care plan section) Acute Rehab PT Goals Patient Stated Goal: "help me find my mom" PT Goal Formulation: With patient/family Time For Goal Achievement: 01/06/21 Potential to Achieve Goals: Fair Progress towards PT goals: Progressing toward goals    Frequency    BID      PT Plan Current plan remains appropriate    Co-evaluation              AM-PAC PT "6 Clicks" Mobility   Outcome Measure  Help needed turning from your back to your side while in a flat bed without using bedrails?: A Lot Help needed moving from lying on your back to sitting on the side of a flat bed without using bedrails?: A Lot Help needed moving to and from a bed to a chair (including a wheelchair)?: A Lot Help needed standing up from a chair using your arms (e.g., wheelchair or bedside chair)?: A Lot Help needed to walk in hospital room?: A Lot Help needed climbing 3-5 steps with a railing? : A Lot 6 Click Score: 12    End of Session Equipment Utilized During Treatment: Gait belt Activity Tolerance: Patient tolerated treatment well Patient left: in bed;with call bell/phone within reach;with nursing/sitter in room Nurse Communication: Mobility status;Precautions;Weight bearing status PT Visit Diagnosis: Unsteadiness on feet (R26.81);Other abnormalities of gait and  mobility (R26.89);Muscle weakness (generalized) (M62.81);History of falling (Z91.81);Ataxic gait (R26.0);Difficulty in walking, not elsewhere classified (R26.2)     Time: 0205-0229 PT Time Calculation (min) (ACUTE ONLY): 24 min  Charges:  $Therapeutic Exercise: 23-37 mins $Therapeutic Activity: 8-22 mins                     Joaquin Music PT DPT 2:43 PM,12/23/20

## 2020-12-23 NOTE — Progress Notes (Signed)
Applied 2L O2 to pt, sats on RA were 87, on 2L sats are 93. Pt will pull O2 out of her nose.

## 2020-12-23 NOTE — TOC Progression Note (Signed)
Transition of Care [facility]) - Progression Note    Patient Details  Name: Aimee Kirk MRN: [id number] Date of Birth: 14-Apr-1923  Transition of Care [facility]) CM/SW [place], RN Phone Number: 12/23/2020, 9:17 AM  Clinical Narrative:    The patient is a hospice patient with [facility], her don will be revoking Hospice and wants her to go to STR SNF to hopefully get back to baseline where she is a stand by assist and walks with a RW, Bedsearch sent out thru the Hub, PASSR obtained, Fl2 completed, will review bed offers once obtained        Expected Discharge Plan and Services                                                 Social Determinants of Health (SDOH) Interventions    Readmission Risk Interventions No flowsheet data found.

## 2020-12-23 NOTE — Evaluation (Signed)
Physical Therapy Evaluation Patient Details Name: Aimee Kirk MRN: [id number] DOB: 1922/11/21 Today's Date: 12/23/2020   History of Present Illness  As per MD H and P: Aimee Kirk is a 87 y.o. female with medical history significant for advanced dementia, chronic diastolic heart failure, moderate mitral regurgitation, who is admitted to [facility] on 12/21/2020 with acute left femoral neck fracture after presenting from ALF to [facility] ED for evaluation of fall.  Per these reports, the patient was bending over to tie her shoe, when she fell forward, landing on her left side, including left hip, as the principal point of contact with the floor. No other complaints. Pt is now S/P L hemiarthroplasty, WBAT to LLE.  Clinical Impression  The patient is alert pleasant presented in bed with Son present by her side. Pt agreed to participate  with PT evaluation. Pt  demonstrated.  MMT in L Hip 3-. 3/4 in RLE and L knee and ankle. Pt needed max assist 2+ with bed mob and transfer. Pt unable to take forward steps but took 2-3 side steps with max assist and MC vc. Pt motivated to particaipte with all activities with simple one step commands.  Pt will benefit from BID PT interventions to improve QOL and  functional mobility ,   Follow Up Recommendations SNF    Equipment Recommendations      Pt has all equipments at ALF.   Recommendations for Other Services       Precautions / Restrictions Precautions Precautions: Posterior Hip Precaution Booklet Issued: Yes (comment) Precaution Comments: Pt confused and carryover is questionable. Keep Abductor pilllow when in bed otherwise pillow betwen BLE. Restrictions Weight Bearing Restrictions: Yes LLE Weight Bearing: Weight bearing as tolerated      Mobility  Bed Mobility Overal bed mobility: Needs Assistance Bed Mobility: Supine to Sit   Sidelying to sit: Max assist            Transfers Overall transfer level: Needs  assistance Equipment used: Rolling walker (2 wheeled) Transfers: Sit to/from [facility] Sit to Stand: +2 physical assistance Stand pivot transfers: +2 physical assistance          Ambulation/Gait         Gait velocity: decreased/freezes due to decreased loading response to LLE.      Stairs            Wheelchair Mobility    Modified Rankin (Stroke Patients Only)       Balance Overall balance assessment: Needs assistance Sitting-balance support: Bilateral upper extremity supported;Feet supported Sitting balance-Leahy Scale: Fair     Standing balance support: Bilateral upper extremity supported Standing balance-Leahy Scale: Poor                               Pertinent Vitals/Pain Pain Assessment: No/denies pain    Home Living Family/patient expects to be discharged to:: Assisted living               Home Equipment: Walker - 2 wheels;Shower seat;Grab bars - toilet;Toilet riser;Hospital bed;Wheelchair - manual Additional Comments: Pt is a poor hisptiran, PT communicated with Son and found, pt ambualted int he ALF hallways using FW independently. Pt needed assistance with dressing, transfering to stand and to W/C. Set up help for meals.    Prior Function Level of Independence: Needs assistance   Gait / Transfers Assistance Needed: independent in the ALF hallways.  ADL's / Homemaking  Assistance Needed: 1+ assisst with ADLS.        Hand Dominance        Extremity/Trunk Assessment   Upper Extremity Assessment Upper Extremity Assessment: Overall WFL for tasks assessed    Lower Extremity Assessment Lower Extremity Assessment: Generalized weakness;LLE deficits/detail LLE Deficits / Details: 3-/5 MMT       Communication   Communication: HOH  Cognition Arousal/Alertness: Awake/alert Behavior During Therapy:  (Confused) Overall Cognitive Status: History of cognitive impairments - at baseline                                  General Comments: needs repetition of information      General Comments      Exercises     Assessment/Plan    PT Assessment Patient needs continued PT services  PT Problem List Decreased strength;Decreased mobility;Decreased safety awareness;Decreased range of motion;Decreased knowledge of precautions;Decreased activity tolerance;Decreased cognition;Cardiopulmonary status limiting activity;Decreased balance;Decreased knowledge of use of DME       PT Treatment Interventions Gait training;Functional mobility training;Therapeutic activities;Therapeutic exercise;Balance training;Neuromuscular re-education;Cognitive remediation;Patient/family education    PT Goals (Current goals can be found in the Care Plan section)  Acute Rehab PT Goals Patient Stated Goal: The son who is a POA statetd" I want my mom to walkwith FW so that she can return to the ALF." PT Goal Formulation: With patient/family Time For Goal Achievement: 01/06/21 Potential to Achieve Goals: Fair    Frequency BID   Barriers to discharge        Co-evaluation               AM-PAC PT "6 Clicks" Mobility  Outcome Measure Help needed turning from your back to your side while in a flat bed without using bedrails?: A Lot Help needed moving from lying on your back to sitting on the side of a flat bed without using bedrails?: A Lot Help needed moving to and from a bed to a chair (including a wheelchair)?: A Lot Help needed standing up from a chair using your arms (e.g., wheelchair or bedside chair)?: A Lot Help needed to walk in hospital room?: Total Help needed climbing 3-5 steps with a railing? : Total 6 Click Score: 10    End of Session Equipment Utilized During Treatment: Gait belt Activity Tolerance: Patient tolerated treatment well;Patient limited by fatigue (limited due cognitive impairments.) Patient left: in chair;with chair alarm set;with family/visitor present;with call  bell/phone within reach Nurse Communication: Mobility status;Precautions;Weight bearing status PT Visit Diagnosis: Unsteadiness on feet (R26.81);Other abnormalities of gait and mobility (R26.89);Muscle weakness (generalized) (M62.81);History of falling (Z91.81);Ataxic gait (R26.0);Difficulty in walking, not elsewhere classified (R26.2)    Time: [id number] PT Time Calculation (min) (ACUTE ONLY): 39 min   Charges:   PT Evaluation $PT Eval Moderate Complexity: 1 Mod PT Treatments $Therapeutic Activity: 8-22 mins        Joaquin Music PT DPT 12:46 PM,12/23/20

## 2020-12-23 NOTE — Discharge Instructions (Signed)

## 2020-12-23 NOTE — Anesthesia Postprocedure Evaluation (Signed)
Anesthesia Post Note  Patient: Aimee Kirk  Procedure(s) Performed: ARTHROPLASTY BIPOLAR HIP (HEMIARTHROPLASTY) (Left: Hip)  Patient location during evaluation: Nursing Unit Anesthesia Type: Spinal Level of consciousness: awake, oriented and awake and alert Pain management: pain level controlled Vital Signs Assessment: post-procedure vital signs reviewed and stable Respiratory status: spontaneous breathing, respiratory function stable and nonlabored ventilation Cardiovascular status: stable Postop Assessment: no backache and no headache Anesthetic complications: no   No notable events documented.   Last Vitals:  Vitals:   12/23/20 1010 12/23/20 1227  BP: (!) 116/55 116/60  Pulse: (!) 109 96  Resp: 17 17  Temp: 36.7 C 36.6 C  SpO2: 94% 93%    Last Pain:  Vitals:   12/23/20 1002  TempSrc:   PainSc: 6                  [employer]

## 2020-12-24 DIAGNOSIS — I5032 Chronic diastolic (congestive) heart failure: Secondary | ICD-10-CM | POA: Diagnosis not present

## 2020-12-24 DIAGNOSIS — S72002A Fracture of unspecified part of neck of left femur, initial encounter for closed fracture: Secondary | ICD-10-CM | POA: Diagnosis not present

## 2020-12-24 LAB — BASIC METABOLIC PANEL
Anion gap: 6 (ref 5–15)
BUN: 29 mg/dL — ABNORMAL HIGH (ref 8–23)
CO2: 22 mmol/L (ref 22–32)
Calcium: 8.2 mg/dL — ABNORMAL LOW (ref 8.9–10.3)
Chloride: 109 mmol/L (ref 98–111)
Creatinine, Ser: 0.86 mg/dL (ref 0.44–1.00)
GFR, Estimated: >60 mL/min (ref >60)
Glucose, Bld: 114 mg/dL — ABNORMAL HIGH (ref 70–99)
Potassium: 4.6 mmol/L (ref 3.5–5.1)
Sodium: 137 mmol/L (ref 135–145)

## 2020-12-24 LAB — CBC
HCT: 27.9 % — ABNORMAL LOW (ref 36.0–46.0)
Hemoglobin: 9.1 g/dL — ABNORMAL LOW (ref 12.0–15.0)
MCH: 30.6 pg (ref 26.0–34.0)
MCHC: 32.6 g/dL (ref 30.0–36.0)
MCV: 93.9 fL (ref 80.0–100.0)
Platelets: 270 10*3/uL (ref 150–400)
RBC: 2.97 MIL/uL — ABNORMAL LOW (ref 3.87–5.11)
RDW: 14.4 % (ref 11.5–15.5)
WBC: 17.2 10*3/uL — ABNORMAL HIGH (ref 4.0–10.5)
nRBC: 0 % (ref 0.0–0.2)

## 2020-12-24 LAB — SURGICAL PATHOLOGY

## 2020-12-24 MED ORDER — POLYSACCHARIDE IRON COMPLEX 150 MG PO CAPS
150.0000 mg | ORAL_CAPSULE | Freq: Every day | ORAL | Status: DC
  Administered 2020-12-24 – 2020-12-31 (×8): 150 mg via ORAL
  Filled 2020-12-24 (×8): qty 1

## 2020-12-24 MED ORDER — FOSFOMYCIN TROMETHAMINE 3 G PO PACK
3.0000 g | PACK | Freq: Once | ORAL | Status: AC
  Administered 2020-12-24: 3 g via ORAL
  Filled 2020-12-24: qty 3

## 2020-12-24 MED ORDER — CYANOCOBALAMIN 1000 MCG/ML IJ SOLN
1000.0000 ug | Freq: Every day | INTRAMUSCULAR | Status: AC
  Administered 2020-12-24 – 2020-12-26 (×3): 1000 ug via INTRAMUSCULAR
  Filled 2020-12-24 (×3): qty 1

## 2020-12-24 NOTE — TOC Progression Note (Signed)
Transition of Care [facility]) - Progression Note    Patient Details  Name: Aimee Kirk MRN: [id number] Date of Birth: 1922-11-16  Transition of Care [facility]) CM/SW [place], RN Phone Number: 12/24/2020, 2:02 PM  Clinical Narrative:     The patient's son called back and provided the bed choice of [facility], I explained to him that the patient will revoke her hospice if she goes to STR, I explained that [facility] may deny the request due to the patient not being able to follow commands and having dementia, I encouraged him to have an alternative plan, I explained that she does not have the Long term insurance therefore a long term bed would be out of pocket, he stated that he is working on filling out the application for Medicaid, I explained that Medicaid can take a couple of months to get approval or denial, I explained that the patient may end up having to go back home with Hospice, he will call the ALF to see if they are able to accept the patient back if in the even she is denied to go to  [facility] SNF, I called and notified [facility] of the bed choice and requested that they start the insurance auth with [facility]       Expected Discharge Plan and Services                                                 Social Determinants of Health (SDOH) Interventions    Readmission Risk Interventions No flowsheet data found.

## 2020-12-24 NOTE — TOC Progression Note (Signed)
Transition of Care [facility]) - Progression Note    Patient Details  Name: Aimee Kirk MRN: [id number] Date of Birth: [birth year]  Transition of Care [facility]) CM/SW [place], RN Phone Number: 12/24/2020, 9:59 AM  Clinical Narrative:     Spoke with the son Ray and reviewed each of the bed offers, Reviewed star ratings, explained we need to start the insurance process, he will review the bed offers and call me back with a choice        Expected Discharge Plan and Services                                                 Social Determinants of Health (SDOH) Interventions    Readmission Risk Interventions No flowsheet data found.

## 2020-12-24 NOTE — Progress Notes (Addendum)
[facility]) Hospital Liaison RN Note:   This patient is currently followed by [facility] hosp[facility] services at [facility] care home. She has a hospice diagnosis of CVA and vascular dementia. This is a related admission per Hospice physician Dr. Jewel Baize. Patient was brought t the [facility] ED on 8/7 by her son following a fall at her care home. In the ED xray revealed a left femoral neck fracture, orthopedics were consulted and patient underwent surgical repair 8/8.   Visited patient at bedside. Patient's son, Jeanell Sparrow present during visit. Patient has been sleeping frequently this morning. She was awake during visit. Continues to be confused. Denies pain at present. She had worked with PT again this AM. Son continues to state that if patient is approved for short term rehab, he will likely choose to revoke hospice services and patient will transfer to STR. Continue to await final decision related to short term rehab. Will continue to follow through discharge disposition.   Patient remains inpatient appropriate related to post op care s/p surgery on 8.8.22 and monitor effectiveness of symptom management with PO medications.    VS: T-99.8, HR-98, RR-16, BP-106/51, O2 sats - 91% on 2L O2   I/O: 1579.1/50   Abnormal labs: Glucose: 114 (H) BUN: 29 (H) Calcium: 8.2 (L) WBC: 17.2 (H) RBC: 2.97 (L) Hemoglobin: 9.1 (L) HCT: 27.9 (L)   Diagnostics: No new diagnostics    IV/PRN meds: Tramadol 50 mg x 1, oxycodone 5 mg x1   Problem List: Principal Problem:   Closed left hip fracture [facility]) Active Problems:   Fall   Protein calorie malnutrition (HCC)   Leukocytosis   Chronic diastolic CHF (congestive heart failure) (HCC)   Essential hypertension   #1.  Left femoral neck fracture. Mechanical fall. Status post surgery Patient has some baseline confusion.  She has increased leukocytosis today, with abnormal urine yesterday, I will give a dose of  fosfomycin.   #2.  Acute blood loss anemia. Iron deficient anemia B12 deficient anemia. Will give iron, B12 injection.  Follow CBC.  3.  Chronic diastolic congestive heart failure Stable  4.  Dementia.   5.  Hyponatremia Stable.   Discharge planning: ongoing, son would be open to short term rehab if patient qualifies   Family contact: Spoke with son, Ray at bedside   IDT: updated   Goals of Care: Patient has an out of facility DNR in place   Please do not hesitate to call with any hospice related questions or concerns.   Thank you,   Bobbie "Loren Racer, [place], BSN [facility] Liaison [phone]

## 2020-12-24 NOTE — Progress Notes (Signed)
PROGRESS NOTE    Aimee Kirk  [id number] DOB: [birth year] DOA: 12/21/2020 PCP: Orvis Brill, [facility]   Chief complaint.  Left hip pain. Brief Narrative:   Aimee Kirk is a 87 y.o. female with medical history significant for advanced dementia, chronic diastolic heart failure, moderate mitral regurgitation, who is admitted to [facility] on 12/21/2020 with acute left femoral neck fracture after presenting from ALF to [facility] ED for evaluation of fall.  mages study showed left hip fracture.  Patient had surgery performed on 8/8.  Assessment & Plan:   Principal Problem:   Closed left hip fracture [facility]) Active Problems:   Fall   Protein calorie malnutrition (HCC)   Leukocytosis   Chronic diastolic CHF (congestive heart failure) ([place])   Essential hypertension  #1.  Left femoral neck fracture. Mechanical fall. Status post surgery Patient has some baseline confusion.  She has increased leukocytosis today, with abnormal urine yesterday, I will give a dose of fosfomycin.  #2.  Acute blood loss anemia. Iron deficient anemia B12 deficient anemia. Will give iron, B12 injection.  Follow CBC.  3.  Chronic diastolic congestive heart failure Stable  4.  Dementia.   5.  Hyponatremia Stable.   DVT prophylaxis: Lovenox Code Status: DNR Family Communication:  Disposition Plan:    Status is: Inpatient  Remains inpatient appropriate because:Inpatient level of care appropriate due to severity of illness  Dispo: The patient is from: Home              Anticipated d/c is to: SNF              Patient currently is not medically stable to d/c.   Difficult to place patient No        I/O last 3 completed shifts: In: 1679.1 [I.V.:1579.1; IV Piggyback:100] Out: 200 [Urine:200] No intake/output data recorded.     Consultants:  Ortho  Procedures: hip  Antimicrobials: None  Subjective: Patient has some baseline confusion.  She still has a poor  appetite, but no nausea vomiting.  No abdominal pain. No fever chills. Dysuria or hematuria  No short of breath or cough.   Objective: Vitals:   12/23/20 1923 12/23/20 2313 12/24/20 0633 12/24/20 0734  BP: 113/80 (!) 115/56 (!) 124/52 (!) 106/51  Pulse: (!) 107 (!) 102 (!) 102 98  Resp: '18 17 17 16  '$ Temp: 98.8 F (37.1 C) 98.6 F (37 C) 98.7 F (37.1 C) 99.8 F (37.7 C)  TempSrc:    Oral  SpO2: 97% (!) 89% (!) 89% 91%  Weight:      Height:        Intake/Output Summary (Last 24 hours) at 12/24/2020 1234 Last data filed at 12/23/2020 1500 Gross per 24 hour  Intake 1579.08 ml  Output --  Net 1579.08 ml   Filed Weights   12/21/20 1600 12/22/20 0500  Weight: 45.4 kg 53.4 kg    Examination:  General exam: Appears calm and comfortable  Respiratory system: Clear to auscultation. Respiratory effort normal. Cardiovascular system: S1 & S2 heard, RRR. No JVD, murmurs, rubs, gallops or clicks. No pedal edema. Gastrointestinal system: Abdomen is nondistended, soft and nontender. No organomegaly or masses felt. Normal bowel sounds heard. Central nervous system: Alert and oriented x1. No focal neurological deficits. Extremities: Symmetric 5 x 5 power. Skin: No rashes, lesions or ulcers     Data Reviewed: I have personally reviewed following labs and imaging studies  CBC: Recent Labs  Lab 12/21/20 1735 12/22/20 0421  12/23/20 0408 12/24/20 0434  WBC 21.2* 11.9* 13.8* 17.2*  NEUTROABS 19.5* 9.9*  --   --   HGB 12.2 10.8* 8.6* 9.1*  HCT 35.9* 32.5* 26.2* 27.9*  MCV 92.1 93.9 94.2 93.9  PLT 275 259 222 [id number]   Basic Metabolic Panel: Recent Labs  Lab 12/21/20 1735 12/21/20 1935 12/22/20 0421 12/23/20 0408 12/24/20 0434  NA 134*  --  134* 134* 137  K 4.3  --  4.2 4.6 4.6  CL 100  --  101 105 109  CO2 27  --  28 21* 22  GLUCOSE 143*  --  113* 105* 114*  BUN 19  --  18 21 29*  CREATININE 0.93  --  0.82 0.95 0.86  CALCIUM 9.0  --  8.5* 7.5* 8.2*  MG  --  1.9 2.3  --    --    GFR: Estimated Creatinine Clearance: 30.3 mL/min (by C-G formula based on SCr of 0.86 mg/dL). Liver Function Tests: Recent Labs  Lab 12/21/20 1735 12/22/20 0421  AST 36 29  ALT 23 21  ALKPHOS 108 91  BILITOT 1.2 1.5*  PROT 6.9 6.1*  ALBUMIN 4.1 3.5   No results for input(s): LIPASE, AMYLASE in the last 168 hours. No results for input(s): AMMONIA in the last 168 hours. Coagulation Profile: Recent Labs  Lab 12/22/20 0421  INR 1.0   Cardiac Enzymes: No results for input(s): CKTOTAL, CKMB, CKMBINDEX, TROPONINI in the last 168 hours. BNP (last 3 results) No results for input(s): PROBNP in the last 8760 hours. HbA1C: No results for input(s): HGBA1C in the last 72 hours. CBG: No results for input(s): GLUCAP in the last 168 hours. Lipid Profile: No results for input(s): CHOL, HDL, LDLCALC, TRIG, CHOLHDL, LDLDIRECT in the last 72 hours. Thyroid Function Tests: No results for input(s): TSH, T4TOTAL, FREET4, T3FREE, THYROIDAB in the last 72 hours. Anemia Panel: Recent Labs    12/23/20 1439  VITAMINB12 141*  TIBC 211*  IRON 14*   Sepsis Labs: No results for input(s): PROCALCITON, LATICACIDVEN in the last 168 hours.  Recent Results (from the past 240 hour(s))  Resp Panel by RT-PCR (Flu A&B, Covid) Nasopharyngeal Swab     Status: None   Collection Time: 12/21/20  5:35 PM   Specimen: Nasopharyngeal Swab; Nasopharyngeal(NP) swabs in vial transport medium  Result Value Ref Range Status   SARS Coronavirus 2 by RT PCR NEGATIVE NEGATIVE Final    Comment: (NOTE) SARS-CoV-2 target nucleic acids are NOT DETECTED.  The SARS-CoV-2 RNA is generally detectable in upper respiratory specimens during the acute phase of infection. The lowest concentration of SARS-CoV-2 viral copies this assay can detect is 138 copies/mL. A negative result does not preclude SARS-Cov-2 infection and should not be used as the sole basis for treatment or other patient management decisions. A negative  result may occur with  improper specimen collection/handling, submission of specimen other than nasopharyngeal swab, presence of viral mutation(s) within the areas targeted by this assay, and inadequate number of viral copies(<138 copies/mL). A negative result must be combined with clinical observations, patient history, and epidemiological information. The expected result is Negative.  Fact Sheet for Patients:  EntrepreneurPulse.com.au  Fact Sheet for Healthcare Providers:  IncredibleEmployment.be  This test is no t yet approved or cleared by the [place] FDA and  has been authorized for detection and/or diagnosis of SARS-CoV-2 by FDA under an Emergency Use Authorization (EUA). This EUA will remain  in effect (meaning this test can be used) for  the duration of the COVID-19 declaration under Section 564(b)(1) of the Act, 21 U.S.C.section 360bbb-3(b)(1), unless the authorization is terminated  or revoked sooner.       Influenza A by PCR NEGATIVE NEGATIVE Final   Influenza B by PCR NEGATIVE NEGATIVE Final    Comment: (NOTE) The Xpert Xpress SARS-CoV-2/FLU/RSV plus assay is intended as an aid in the diagnosis of influenza from Nasopharyngeal swab specimens and should not be used as a sole basis for treatment. Nasal washings and aspirates are unacceptable for Xpert Xpress SARS-CoV-2/FLU/RSV testing.  Fact Sheet for Patients: EntrepreneurPulse.com.au  Fact Sheet for Healthcare Providers: IncredibleEmployment.be  This test is not yet approved or cleared by the [place] FDA and has been authorized for detection and/or diagnosis of SARS-CoV-2 by FDA under an Emergency Use Authorization (EUA). This EUA will remain in effect (meaning this test can be used) for the duration of the COVID-19 declaration under Section 564(b)(1) of the Act, 21 U.S.C. section 360bbb-3(b)(1), unless the authorization is  terminated or revoked.  Performed at [facility], [address], [place], [place] [address]          Radiology Studies: DG Pelvis Portable  Result Date: 12/22/2020 CLINICAL DATA:  Postop left hip arthroplasty. EXAM: PORTABLE PELVIS 1-2 VIEWS COMPARISON:  Preoperative radiograph yesterday. FINDINGS: Left hip arthroplasty in expected alignment. There is no periprosthetic lucency or fracture. Recent postsurgical change includes air and edema in the soft tissues as well as lateral skin staples. The bones are under mineralized IMPRESSION: Left hip arthroplasty without immediate postoperative complication. Electronically Signed   By: Keith Rake M.D.   On: 12/22/2020 15:04   DG Pelvis Portable  Result Date: 12/22/2020 CLINICAL DATA:  Elective surgery.  Intraop. EXAM: PORTABLE PELVIS 1-2 VIEWS COMPARISON:  Preoperative radiograph yesterday. FINDINGS: AP view of the pelvis obtained in the operating room during left hip arthroplasty. Femoral component is in place. IMPRESSION: Intraoperative radiograph during left hip arthroplasty. Electronically Signed   By: Keith Rake M.D.   On: 12/22/2020 15:04   DG Pelvis Portable  Result Date: 12/22/2020 CLINICAL DATA:  Elective surgery. EXAM: PORTABLE PELVIS 1-2 VIEWS COMPARISON:  Pelvis and hip radiograph yesterday. FINDINGS: AP view of the pelvis obtained in the operating room during left hip arthroplasty. Femoral spacer is in place. IMPRESSION: Intraoperative radiograph during left hip arthroplasty. Electronically Signed   By: Keith Rake M.D.   On: 12/22/2020 15:03        Scheduled Meds:  Chlorhexidine Gluconate Cloth  6 each Topical Daily   cyanocobalamin  1,000 mcg Intramuscular Daily   docusate sodium  100 mg Oral BID   enoxaparin (LOVENOX) injection  30 mg Subcutaneous Q24H   feeding supplement  237 mL Oral BID BM   iron polysaccharides  150 mg Oral Daily   metoprolol tartrate  50 mg Oral BID   Continuous  Infusions:   LOS: 3 days    Time spent: 27 minutes    Sharen Hones, MD [facility]   To contact the attending provider between 7A-7P or the covering provider during after hours 7P-7A, please log into the web site [email or web address] and access using universal [place] password for that web site. If you do not have the password, please call the hospital operator.  12/24/2020, 12:34 PM

## 2020-12-24 NOTE — Progress Notes (Signed)
Physical Therapy Treatment Patient Details Name: Aimee Kirk MRN: [id number] DOB: Dec 01, 1922 Today's Date: 12/24/2020    History of Present Illness As per MD H and P: Aimee Kirk is a 87 y.o. female with medical history significant for advanced dementia, chronic diastolic heart failure, moderate mitral regurgitation, who is admitted to [facility] on 12/21/2020 with acute left femoral neck fracture after presenting from ALF to [facility] ED for evaluation of fall.  Per these reports, the patient was bending over to tie her shoe, when she fell forward, landing on her left side, including left hip, as the principal point of contact with the floor. No other complaints. Pt is now S/P L hemiarthroplasty, WBAT to LLE.    PT Comments    Patient received in bed with O2 in place via [place]. Pt difficult to arouse. Pt unable to follow VC and therefore received  PROM to BLE 2 x 10 reps in all planes while maintaining Post hip precaution. Pt appears to demonstrate decrease in ability to participate in therapy as compare to yesterday and today AM session. PT will re-assess pt tomorrow to determine frequency of PT treatments based on pt's tolerance and overall clinical manifestations. Continue skilled pT interventions.   Follow Up Recommendations  SNF     Equipment Recommendations  Rolling walker with 5" wheels    Recommendations for Other Services       Precautions / Restrictions Precautions Precautions: Fall Precaution Comments: Pt is pleasantly confused and unable to recall new information.  Abductor pillow used when in bed. Restrictions Weight Bearing Restrictions: Yes LLE Weight Bearing: Weight bearing as tolerated    Mobility  Bed Mobility Overal bed mobility: Needs Assistance Bed Mobility: Rolling;Supine to Sit Rolling: Max assist;+2 for physical assistance Sidelying to sit: Max assist;+2 for physical assistance Supine to sit: +2 for physical assistance Sit to supine:  +2 for physical assistance   General bed mobility comments: max A for B LEs and trunk secondary to precautions and pt fearful of falling    Transfers Overall transfer level: Needs assistance Equipment used: 2 person hand held assist Transfers: Sit to/from [employer] Transfers Sit to Stand: Max assist Stand pivot transfers: +2 physical assistance          Ambulation/Gait                 Stairs             Wheelchair Mobility    Modified Rankin (Stroke Patients Only)       Balance Overall balance assessment: Needs assistance Sitting-balance support: Feet supported;Bilateral upper extremity supported Sitting balance-Leahy Scale: Zero   Postural control: Posterior lean Standing balance support: Bilateral upper extremity supported Standing balance-Leahy Scale: Zero                              Cognition Arousal/Alertness: Lethargic   Overall Cognitive Status: Impaired/Different from baseline                                 General Comments: hx of dementia. Pt is very confused tryinmg to take her O2 tube out thinking its her eye glasses. Could not follow any commands.      Exercises Total Joint Exercises Ankle Circles/Pumps: PROM;10 reps;20 reps;5 reps Heel Slides: PROM;20 reps Hip ABduction/ADduction: PROM;20 reps    General Comments  Pertinent Vitals/Pain Pain Assessment: Faces (Pt coginition limits pt's reporting.) Pain Location: L hip Pain Intervention(s):  (RN notified about pain)    Home Living                      Prior Function            PT Goals (current goals can now be found in the care plan section) Progress towards PT goals: PT to reassess next treatment    Frequency    BID      PT Plan Current plan remains appropriate (will reasses pt regarding frequency after tomorrow session.)    Co-evaluation              AM-PAC PT "6 Clicks" Mobility   Outcome Measure                    End of Session Equipment Utilized During Treatment: Gait belt Activity Tolerance: Patient limited by fatigue;Patient limited by pain;Patient limited by lethargy Patient left: in bed;with bed alarm set Nurse Communication: Mobility status;Patient requests pain meds PT Visit Diagnosis: Unsteadiness on feet (R26.81);Other abnormalities of gait and mobility (R26.89);Muscle weakness (generalized) (M62.81);History of falling (Z91.81);Ataxic gait (R26.0);Difficulty in walking, not elsewhere classified (R26.2)     Time: [id number] PT Time Calculation (min) (ACUTE ONLY): 23 min  Charges:  $Therapeutic Exercise: 8-22 mins $Therapeutic Activity: 8-22 mins                    Joaquin Music PT DPT 4:07 PM,12/24/20

## 2020-12-24 NOTE — Progress Notes (Signed)
  Subjective: 2 Days Post-Op Procedure(s) (LRB): ARTHROPLASTY BIPOLAR HIP (HEMIARTHROPLASTY) (Left) Patient reports pain as mild.  Patient resting. Patient is well, and has had no acute complaints or problems Plan is to go Skilled nursing facility after hospital stay. Negative for chest pain and shortness of breath Fever: no Gastrointestinal: Negative for nausea and vomiting  Objective: Vital signs in last 24 hours: Temp:  [97.8 F (36.6 C)-98.8 F (37.1 C)] 98.7 F (37.1 C) (08/10 0633) Pulse Rate:  [82-109] 102 (08/10 0633) Resp:  [17-18] 17 (08/10 0633) BP: (104-124)/(47-80) 124/52 (08/10 0633) SpO2:  [89 %-97 %] 89 % (08/10 0633)  Intake/Output from previous day:  Intake/Output Summary (Last 24 hours) at 12/24/2020 0656 Last data filed at 12/23/2020 1500 Gross per 24 hour  Intake 1579.08 ml  Output 50 ml  Net 1529.08 ml    Intake/Output this shift: No intake/output data recorded.  Labs: Recent Labs    12/21/20 1735 12/22/20 0421 12/23/20 0408 12/24/20 0434  HGB 12.2 10.8* 8.6* 9.1*   Recent Labs    12/23/20 0408 12/24/20 0434  WBC 13.8* 17.2*  RBC 2.78* 2.97*  HCT 26.2* 27.9*  PLT 222 270   Recent Labs    12/23/20 0408 12/24/20 0434  NA 134* 137  K 4.6 4.6  CL 105 109  CO2 21* 22  BUN 21 29*  CREATININE 0.95 0.86  GLUCOSE 105* 114*  CALCIUM 7.5* 8.2*   Recent Labs    12/22/20 0421  INR 1.0     EXAM General - Patient is Alert and Confused Extremity - Neurovascular intact Dorsiflexion/Plantar flexion intact Compartment soft Dressing/Incision - clean, dry, no drainage Motor Function - intact, moving foot and toes well on exam.  Normal physical therapy.  Past Medical History:  Diagnosis Date   Dementia ([place])    Thyroid disease     Assessment/Plan: 2 Days Post-Op Procedure(s) (LRB): ARTHROPLASTY BIPOLAR HIP (HEMIARTHROPLASTY) (Left) Principal Problem:   Closed left hip fracture (HCC) Active Problems:   Fall   Protein calorie  malnutrition (HCC)   Leukocytosis   Chronic diastolic CHF (congestive heart failure) (HCC)   Essential hypertension  Estimated body mass index is 21.53 kg/m as calculated from the following:   Height as of this encounter: '5\' 2"'$  (1.575 m).   Weight as of this encounter: 53.4 kg. Advance diet Up with therapy  Care management with discharge planning  Acute blood loss anemia.  Hemoglobin 9.1.  Follow-up with [facility] clinic orthopedics in 2 weeks for staple removal and x-rays of the left hip.  DVT Prophylaxis - Lovenox, Foot Pumps, and TED hose Weight-Bearing as tolerated to left leg  Reche Dixon, PA-C Orthopaedic Surgery 12/24/2020, 6:56 AM

## 2020-12-24 NOTE — Progress Notes (Signed)
Physical Therapy Treatment Patient Details Name: Aimee Kirk MRN: [id number] DOB: 04/06/1923 Today's Date: 12/24/2020    History of Present Illness As per MD H and P: Aimee Kirk is a 87 y.o. female with medical history significant for advanced dementia, chronic diastolic heart failure, moderate mitral regurgitation, who is admitted to [facility] on 12/21/2020 with acute left femoral neck fracture after presenting from ALF to [facility] ED for evaluation of fall.  Per these reports, the patient was bending over to tie her shoe, when she fell forward, landing on her left side, including left hip, as the principal point of contact with the floor. No other complaints. Pt is now S/P L hemiarthroplasty, WBAT to LLE.    PT Comments    Patient received in bed with O2 via [place] fast as sleep. Pt needed Mac VC and TC to arouse the tp. Pt appeared lethargic and weak. Severely confused. Pt needed max assist of 2 to roll on Both side and to maintain side lying position. Pt performed STS and SPT to reliner with max of 2. STS from chair with max of 1. Pt needed increased assistance due to decreased ability to  follow commands and to participate in therapy as compared to yesterday. Pt will benefit from continued skilled PT interventions.   Follow Up Recommendations  SNF     Equipment Recommendations  Rolling walker with 5" wheels    Recommendations for Other Services       Precautions / Restrictions Precautions Precautions: Fall;Posterior Hip Precaution Comments: Pt is pleasantly confused and unable to recall new information.  Abductor pillow used when in bed. Restrictions Weight Bearing Restrictions: Yes LLE Weight Bearing: Weight bearing as tolerated    Mobility  Bed Mobility Overal bed mobility: Needs Assistance Bed Mobility: Rolling;Supine to Sit Rolling: Max assist;+2 for physical assistance Sidelying to sit: Max assist;+2 for physical assistance       General bed  mobility comments: max A for B LEs and trunk secondary to precautions and pt fearful of falling    Transfers Overall transfer level: Needs assistance Equipment used: 2 person hand held assist Transfers: Sit to/from [employer] Sit to Stand: Max assist Stand pivot transfers: +2 physical assistance          Ambulation/Gait                 Stairs             Wheelchair Mobility    Modified Rankin (Stroke Patients Only)       Balance Overall balance assessment: Needs assistance Sitting-balance support: Feet supported;Bilateral upper extremity supported Sitting balance-Leahy Scale: Fair   Postural control: Posterior lean Standing balance support: Bilateral upper extremity supported Standing balance-Leahy Scale: Poor                              Cognition Arousal/Alertness: Lethargic   Overall Cognitive Status: History of cognitive impairments - at baseline                                 General Comments: hx of dementia. Pt is very confused tryinmg to take her O2 tube out thinking its her eye glasses. Cont. to follow 1 steps commands      Exercises      General Comments        Pertinent Vitals/Pain Pain Assessment:  Faces Pain Location: L hip Pain Intervention(s):  (RN notified about pain)    Home Living                      Prior Function            PT Goals (current goals can now be found in the care plan section) Progress towards PT goals: PT to reassess next treatment    Frequency    BID      PT Plan Current plan remains appropriate    Co-evaluation              AM-PAC PT "6 Clicks" Mobility   Outcome Measure                   End of Session Equipment Utilized During Treatment: Gait belt Activity Tolerance: Patient limited by fatigue;Patient limited by pain;Patient limited by lethargy Patient left: in chair;with call bell/phone within reach;with chair alarm  set;with family/visitor present Nurse Communication: Mobility status;Precautions;Weight bearing status;Patient requests pain meds PT Visit Diagnosis: Unsteadiness on feet (R26.81);Other abnormalities of gait and mobility (R26.89);Muscle weakness (generalized) (M62.81);History of falling (Z91.81);Ataxic gait (R26.0);Difficulty in walking, not elsewhere classified (R26.2)     Time: 1937-9024 PT Time Calculation (min) (ACUTE ONLY): 37 min  Charges:  $Therapeutic Activity: 23-37 mins                    Aimee Kirk PT DPT 1:55 PM,12/24/20

## 2020-12-25 DIAGNOSIS — S72002A Fracture of unspecified part of neck of left femur, initial encounter for closed fracture: Secondary | ICD-10-CM | POA: Diagnosis not present

## 2020-12-25 DIAGNOSIS — E871 Hypo-osmolality and hyponatremia: Secondary | ICD-10-CM

## 2020-12-25 DIAGNOSIS — E43 Unspecified severe protein-calorie malnutrition: Secondary | ICD-10-CM

## 2020-12-25 DIAGNOSIS — I5032 Chronic diastolic (congestive) heart failure: Secondary | ICD-10-CM | POA: Diagnosis not present

## 2020-12-25 LAB — CBC
HCT: 24.9 % — ABNORMAL LOW (ref 36.0–46.0)
Hemoglobin: 8.2 g/dL — ABNORMAL LOW (ref 12.0–15.0)
MCH: 31.1 pg (ref 26.0–34.0)
MCHC: 32.9 g/dL (ref 30.0–36.0)
MCV: 94.3 fL (ref 80.0–100.0)
Platelets: 300 10*3/uL (ref 150–400)
RBC: 2.64 MIL/uL — ABNORMAL LOW (ref 3.87–5.11)
RDW: 14.7 % (ref 11.5–15.5)
WBC: 15.3 10*3/uL — ABNORMAL HIGH (ref 4.0–10.5)
nRBC: 0 % (ref 0.0–0.2)

## 2020-12-25 LAB — BASIC METABOLIC PANEL
Anion gap: 5 (ref 5–15)
BUN: 38 mg/dL — ABNORMAL HIGH (ref 8–23)
CO2: 24 mmol/L (ref 22–32)
Calcium: 8.1 mg/dL — ABNORMAL LOW (ref 8.9–10.3)
Chloride: 110 mmol/L (ref 98–111)
Creatinine, Ser: 0.86 mg/dL (ref 0.44–1.00)
GFR, Estimated: >60 mL/min (ref >60)
Glucose, Bld: 111 mg/dL — ABNORMAL HIGH (ref 70–99)
Potassium: 4.7 mmol/L (ref 3.5–5.1)
Sodium: 139 mmol/L (ref 135–145)

## 2020-12-25 MED ORDER — VITAMIN D 25 MCG (1000 UNIT) PO TABS
1000.0000 [IU] | ORAL_TABLET | Freq: Every day | ORAL | Status: DC
  Administered 2020-12-25 – 2020-12-31 (×7): 1000 [IU] via ORAL
  Filled 2020-12-25 (×7): qty 1

## 2020-12-25 MED ORDER — TAMSULOSIN HCL 0.4 MG PO CAPS
0.4000 mg | ORAL_CAPSULE | Freq: Every day | ORAL | Status: DC
  Administered 2020-12-25 – 2020-12-31 (×7): 0.4 mg via ORAL
  Filled 2020-12-25 (×7): qty 1

## 2020-12-25 NOTE — Progress Notes (Addendum)
[facility] [address] [facility]) Hospitalized Hospice Patient   Aimee Kirk is a current hospice patient with a terminal diagnosis of end stage cerebrovascular disease and vascular dementia.   Aimee Kirk was brought to the emergency room on 8/7 by her son following a fall at her facility. Imaging revealed a left femoral neck fracture, orthopedics were consulted and patient underwent surgical repair 8/8. She is admitted for management of left closed hip fracture. Per Dr. Jewel Baize with [facility], this is a related hospital admission.  Met with patient at the bedside. She had been mobilized by PT and was sitting in her recliner. She is pleasantly confused and cooperative. She endorses no pain or unmet needs by the staff. She is very grateful for all the care she is receiving. Placement at [facility] in ongoing and insurance verification issues are being worked through by Engineer, technical sales. Discussed with son Aimee Kirk on the phone, he understands that medicare does will not cover hospice and rehab and he would prefer to revoke hospice services and pursue rehab once all is set up at facility.  V/S: 97.9, 140/58, HR 87, RR 18, SPO2 100% on RA I&O: 0/700 for 8/11 Labs:  WBCs 15.3, RBC 2.64, hgb 8.2, HCT 24.9 Diagnostics: none IVS/PRNS: oxycodone 2.5 mg PO x 1 for pain, oxycodone 5 mg PO x 1 for pain  Problem List: - left hip fracture - s/p repair, mobilized today, plan for SNF - UTI - felt to be secondary to retention, flomax added  She is waiting placement at [facility] facility pending insurance clarification. Patient has been changed to routine level of care.   IDT: Team updated Family: Family updated GOC: Clear, pursue rehab to see if mobility can be restored  D/C planning: SNF and revoke hospice per the families wishes  Venia Carbon RN, BSN, [facility] Liaison

## 2020-12-25 NOTE — Care Management Important Message (Signed)
Important Message  Patient Details  Name: Aimee Kirk MRN: [id number] Date of Birth: Oct 30, 1922   Medicare Important Message Given:  Other (see comment)  I called HCPOA, Gwendolynne Debonis, son at [phone] to review the Important Message from [facility] but had to leave a voice message. I have asked him to contact my co-worker Dannette Barbara at [phone] so she can review it with him.   [name] 12/25/2020, 3:44 PM

## 2020-12-25 NOTE — TOC Progression Note (Addendum)
Transition of Care [facility]) - Progression Note    Patient Details  Name: KERRIGAN RUPRIGHT MRN: [id number] Date of Birth: [birth year]  Transition of Care [facility]) CM/SW Contact  Su Hilt, RN Phone Number: 12/25/2020, 10:12 AM  Clinical Narrative:    I called Ebony Hail at [facility] in [place] and let her know that the patient is now showing up at [facility] and [facility], she has started the insurance auth for [employer], Awaiting a call back with insurance verification.  After [facility] rehab pulled up the insurance they verify that Holland Falling is Primary and the Josem Kaufmann is still pending      Expected Discharge Plan and Services                                                 Social Determinants of Health (SDOH) Interventions    Readmission Risk Interventions No flowsheet data found.

## 2020-12-25 NOTE — Progress Notes (Signed)
PROGRESS NOTE    Aimee Kirk  [id number] DOB: 04-24-1923 DOA: 12/21/2020 PCP: Orvis Brill, [facility]   Chief complaint left hip pain. Brief Narrative:   Aimee Kirk is a 87 y.o. female with medical history significant for advanced dementia, chronic diastolic heart failure, moderate mitral regurgitation, who is admitted to [facility] on 12/21/2020 with acute left femoral neck fracture after presenting from ALF to [facility] ED for evaluation of fall.  mages study showed left hip fracture.  Patient had surgery performed on 8/8.  Assessment & Plan:   Principal Problem:   Closed left hip fracture [facility]) Active Problems:   Fall   Protein calorie malnutrition ([place])   Leukocytosis   Chronic diastolic CHF (congestive heart failure) (HCC)   Essential hypertension   Protein-calorie malnutrition, severe  #1.  Left femoral neck fracture. Mechanical fall. Status post surgery Patient still has confusion, she is affected by nurse.  She is pending for nursing home placement.  2.  Urinary tract infection. Urinary retention secondary to urinary tract infection. Patient received a dose of fosfomycin for UTI yesterday.  Today, she developed urinary retention, Foley cath was anchored, she was also treated with Flomax.   3.  Acute blood loss anemia Iron deficient anemia B12 deficient anemia Continue injected B12 and oral iron.  Recheck CBC tomorrow  4.  Chronic diastolic congestive heart failure Stable for  5.  Dementia plan   DVT prophylaxis: Lovenox Code Status: DNR Family Communication: son updated Disposition Plan:      Status is: Inpatient   Remains inpatient appropriate because:Inpatient level of care appropriate due to severity of illness   Dispo: The patient is from: Home              Anticipated d/c is to: SNF              Patient currently is not medically stable to d/c.              Difficult to place patient No           No  intake/output data recorded. Total I/O In: 0  Out: 36 [Urine:700]     Consultants:  Ortho  Procedures: Hip  Antimicrobials: none  Subjective: Patient is confused, she is fed by the nurse.  When I see her, she appeared to have distended bladder, residual over 800 mL, Foley catheter was anchored. She denies any short of breath or cough. She does not have any abdominal pain or nausea vomiting.  No fever or chills.  Objective: Vitals:   12/24/20 2309 12/25/20 0313 12/25/20 0745 12/25/20 1228  BP: (!) 123/59 (!) 122/47 (!) 135/47 (!) 141/57  Pulse: (!) 101 81 79 87  Resp: '20 16 17 18  '$ Temp: 98.9 F (37.2 C) 98.5 F (36.9 C) 98.7 F (37.1 C) 98.3 F (36.8 C)  TempSrc:      SpO2: 94% 93% 94% 98%  Weight:      Height:        Intake/Output Summary (Last 24 hours) at 12/25/2020 1229 Last data filed at 12/25/2020 1011 Gross per 24 hour  Intake 0 ml  Output 700 ml  Net -700 ml   Filed Weights   12/21/20 1600 12/22/20 0500  Weight: 45.4 kg 53.4 kg    Examination:  General exam: Appears calm and comfortable  Respiratory system: Clear to auscultation. Respiratory effort normal. Cardiovascular system: S1 & S2 heard, RRR. No JVD, murmurs, rubs, gallops or clicks. No pedal edema.  Gastrointestinal system: Abdomen is nondistended, soft and nontender. No organomegaly or masses felt. Normal bowel sounds heard. Central nervous system: Alert and oriented x1. No focal neurological deficits. Extremities: Symmetric 5 x 5 power. Skin: No rashes, lesions or ulcers Psychiatry: Mood & affect appropriate.     Data Reviewed: I have personally reviewed following labs and imaging studies  CBC: Recent Labs  Lab 12/21/20 1735 12/22/20 0421 12/23/20 0408 12/24/20 0434 12/25/20 0449  WBC 21.2* 11.9* 13.8* 17.2* 15.3*  NEUTROABS 19.5* 9.9*  --   --   --   HGB 12.2 10.8* 8.6* 9.1* 8.2*  HCT 35.9* 32.5* 26.2* 27.9* 24.9*  MCV 92.1 93.9 94.2 93.9 94.3  PLT 275 259 222 270 [id number]    Basic Metabolic Panel: Recent Labs  Lab 12/21/20 1735 12/21/20 1935 12/22/20 0421 12/23/20 0408 12/24/20 0434 12/25/20 0449  NA 134*  --  134* 134* 137 139  K 4.3  --  4.2 4.6 4.6 4.7  CL 100  --  101 105 109 110  CO2 27  --  28 21* 22 24  GLUCOSE 143*  --  113* 105* 114* 111*  BUN 19  --  18 21 29* 38*  CREATININE 0.93  --  0.82 0.95 0.86 0.86  CALCIUM 9.0  --  8.5* 7.5* 8.2* 8.1*  MG  --  1.9 2.3  --   --   --    GFR: Estimated Creatinine Clearance: 30.3 mL/min (by C-G formula based on SCr of 0.86 mg/dL). Liver Function Tests: Recent Labs  Lab 12/21/20 1735 12/22/20 0421  AST 36 29  ALT 23 21  ALKPHOS 108 91  BILITOT 1.2 1.5*  PROT 6.9 6.1*  ALBUMIN 4.1 3.5   No results for input(s): LIPASE, AMYLASE in the last 168 hours. No results for input(s): AMMONIA in the last 168 hours. Coagulation Profile: Recent Labs  Lab 12/22/20 0421  INR 1.0   Cardiac Enzymes: No results for input(s): CKTOTAL, CKMB, CKMBINDEX, TROPONINI in the last 168 hours. BNP (last 3 results) No results for input(s): PROBNP in the last 8760 hours. HbA1C: No results for input(s): HGBA1C in the last 72 hours. CBG: No results for input(s): GLUCAP in the last 168 hours. Lipid Profile: No results for input(s): CHOL, HDL, LDLCALC, TRIG, CHOLHDL, LDLDIRECT in the last 72 hours. Thyroid Function Tests: No results for input(s): TSH, T4TOTAL, FREET4, T3FREE, THYROIDAB in the last 72 hours. Anemia Panel: Recent Labs    12/23/20 1439  VITAMINB12 141*  TIBC 211*  IRON 14*   Sepsis Labs: No results for input(s): PROCALCITON, LATICACIDVEN in the last 168 hours.  Recent Results (from the past 240 hour(s))  Resp Panel by RT-PCR (Flu A&B, Covid) Nasopharyngeal Swab     Status: None   Collection Time: 12/21/20  5:35 PM   Specimen: Nasopharyngeal Swab; Nasopharyngeal(NP) swabs in vial transport medium  Result Value Ref Range Status   SARS Coronavirus 2 by RT PCR NEGATIVE NEGATIVE Final     Comment: (NOTE) SARS-CoV-2 target nucleic acids are NOT DETECTED.  The SARS-CoV-2 RNA is generally detectable in upper respiratory specimens during the acute phase of infection. The lowest concentration of SARS-CoV-2 viral copies this assay can detect is 138 copies/mL. A negative result does not preclude SARS-Cov-2 infection and should not be used as the sole basis for treatment or other patient management decisions. A negative result may occur with  improper specimen collection/handling, submission of specimen other than nasopharyngeal swab, presence of viral mutation(s) within the areas  targeted by this assay, and inadequate number of viral copies(<138 copies/mL). A negative result must be combined with clinical observations, patient history, and epidemiological information. The expected result is Negative.  Fact Sheet for Patients:  EntrepreneurPulse.com.au  Fact Sheet for Healthcare Providers:  IncredibleEmployment.be  This test is no t yet approved or cleared by the [place] FDA and  has been authorized for detection and/or diagnosis of SARS-CoV-2 by FDA under an Emergency Use Authorization (EUA). This EUA will remain  in effect (meaning this test can be used) for the duration of the COVID-19 declaration under Section 564(b)(1) of the Act, 21 U.S.C.section 360bbb-3(b)(1), unless the authorization is terminated  or revoked sooner.       Influenza A by PCR NEGATIVE NEGATIVE Final   Influenza B by PCR NEGATIVE NEGATIVE Final    Comment: (NOTE) The Xpert Xpress SARS-CoV-2/FLU/RSV plus assay is intended as an aid in the diagnosis of influenza from Nasopharyngeal swab specimens and should not be used as a sole basis for treatment. Nasal washings and aspirates are unacceptable for Xpert Xpress SARS-CoV-2/FLU/RSV testing.  Fact Sheet for Patients: EntrepreneurPulse.com.au  Fact Sheet for Healthcare  Providers: IncredibleEmployment.be  This test is not yet approved or cleared by the [place] FDA and has been authorized for detection and/or diagnosis of SARS-CoV-2 by FDA under an Emergency Use Authorization (EUA). This EUA will remain in effect (meaning this test can be used) for the duration of the COVID-19 declaration under Section 564(b)(1) of the Act, 21 U.S.C. section 360bbb-3(b)(1), unless the authorization is terminated or revoked.  Performed at [facility], [address], [place], [place] [address]          Radiology Studies: No results found.      Scheduled Meds:  Chlorhexidine Gluconate Cloth  6 each Topical Daily   cholecalciferol  1,000 Units Oral Daily   cyanocobalamin  1,000 mcg Intramuscular Daily   docusate sodium  100 mg Oral BID   enoxaparin (LOVENOX) injection  30 mg Subcutaneous Q24H   feeding supplement  237 mL Oral BID BM   iron polysaccharides  150 mg Oral Daily   metoprolol tartrate  50 mg Oral BID   tamsulosin  0.4 mg Oral Daily   Continuous Infusions:   LOS: 4 days    Time spent: 24 minutes    Sharen Hones, MD [facility]   To contact the attending provider between 7A-7P or the covering provider during after hours 7P-7A, please log into the web site [email or web address] and access using universal [place] password for that web site. If you do not have the password, please call the hospital operator.  12/25/2020, 12:29 PM

## 2020-12-25 NOTE — Progress Notes (Signed)
Physical Therapy Treatment Patient Details Name: Aimee Kirk MRN: [id number] DOB: 1923-02-02 Today's Date: 12/25/2020    History of Present Illness As per MD H and P: Aimee Kirk is a 87 y.o. female with medical history significant for advanced dementia, chronic diastolic heart failure, moderate mitral regurgitation, who is admitted to [facility] on 12/21/2020 with acute left femoral neck fracture after presenting from ALF to [facility] ED for evaluation of fall.  Per these reports, the patient was bending over to tie her shoe, when she fell forward, landing on her left side, including left hip, as the principal point of contact with the floor. No other complaints. Pt is now S/P L hemiarthroplasty, WBAT to LLE.    PT Comments    Patient received in recliner without O2 and without distress. Easily arousal.  Pt readily participated in  therapy. Pt STS with Min ot mod assist. Pt ambulated 3 ft, 7 ft x2 with rest breaks in between with mod assist of 1. Pt needed Max VX for postural control and sequencing. Pt performed AROM with max VC for proper technique and to remain on task. Pt is showing progress and will benefit form continued skilled PT interventions to achieve her goals and improve QOL.   Follow Up Recommendations  SNF     Equipment Recommendations  Rolling walker with 5" wheels    Recommendations for Other Services       Precautions / Restrictions Precautions Precautions: Fall Precaution Comments: Pt is pleasantly confused and unable to recall new information.  Abductor pillow used when in bed. Restrictions Weight Bearing Restrictions: Yes RLE Weight Bearing: Weight bearing as tolerated LLE Weight Bearing: Weight bearing as tolerated    Mobility  Bed Mobility Overal bed mobility: Needs Assistance Bed Mobility: Supine to Sit;Sit to Supine Rolling: Max assist   Supine to sit: Max assist Sit to supine: Total assist   General bed mobility comments: Pt  remains fearful but ready to participate    Transfers Overall transfer level: Needs assistance Equipment used: Rolling walker (2 wheeled) Transfers: Sit to/from Stand Sit to Stand: Mod assist Stand pivot transfers: +2 physical assistance          Ambulation/Gait Ambulation/Gait assistance: Mod assist Gait Distance (Feet): 7 Feet (x2) Assistive device: Rolling walker (2 wheeled) Gait Pattern/deviations: Step-to pattern;Decreased stride length;Decreased stance time - left;Ataxic;Shuffle Gait velocity: decreased       Stairs             Wheelchair Mobility    Modified Rankin (Stroke Patients Only)       Balance Overall balance assessment: Needs assistance Sitting-balance support: Feet supported Sitting balance-Leahy Scale: Good   Postural control: Posterior lean Standing balance support: Bilateral upper extremity supported Standing balance-Leahy Scale: Fair                 High Level Balance Comments: Pt is showing progress with balance since yesterday.            Cognition Arousal/Alertness: Awake/alert (pleasantly confused) Behavior During Therapy: Flat affect Overall Cognitive Status: Impaired/Different from baseline                                 General Comments: hx of dementia      Exercises Total Joint Exercises Ankle Circles/Pumps: AROM;20 reps Short Arc Quad: AROM;10 reps Heel Slides: AROM;20 reps Hip ABduction/ADduction: PROM;20 reps Long Arc Quad: AROM;10 reps Knee Flexion:  PROM;AROM;10 reps    General Comments        Pertinent Vitals/Pain Pain Assessment: Faces Faces Pain Scale: Hurts little more Pain Location: L hip Pain Descriptors / Indicators: Aching;Discomfort Pain Intervention(s): Monitored during session;Limited activity within patient's tolerance    Home Living                      Prior Function            PT Goals (current goals can now be found in the care plan section) Acute  Rehab PT Goals Patient Stated Goal: none stated Potential to Achieve Goals: Fair Progress towards PT goals: Progressing toward goals    Frequency    BID      PT Plan Current plan remains appropriate    Co-evaluation              AM-PAC PT "6 Clicks" Mobility   Outcome Measure                   End of Session Equipment Utilized During Treatment: Gait belt Activity Tolerance: Patient tolerated treatment well;Patient limited by fatigue Patient left: in chair;with chair alarm set;with call bell/phone within reach Nurse Communication: Mobility status;Precautions;Weight bearing status PT Visit Diagnosis: Unsteadiness on feet (R26.81);Other abnormalities of gait and mobility (R26.89);Muscle weakness (generalized) (M62.81);History of falling (Z91.81);Ataxic gait (R26.0);Difficulty in walking, not elsewhere classified (R26.2)     Time: [id number] PT Time Calculation (min) (ACUTE ONLY): 39 min  Charges:  $Gait Training: 23-37 mins $Therapeutic Exercise: 8-22 mins $Therapeutic Activity: 23-37 mins                    Joaquin Music PT DPT 4:38 PM,12/25/20

## 2020-12-25 NOTE — Progress Notes (Signed)
  Subjective: 3 Days Post-Op Procedure(s) (LRB): ARTHROPLASTY BIPOLAR HIP (HEMIARTHROPLASTY) (Left) Patient reports pain as mild.  Patient agitated. Patient is well, and has had no acute complaints or problems Plan is to go Skilled nursing facility after hospital stay. Negative for chest pain and shortness of breath Fever: no Gastrointestinal: Negative for nausea and vomiting  Objective: Vital signs in last 24 hours: Temp:  [98 F (36.7 C)-99.8 F (37.7 C)] 98.5 F (36.9 C) (08/11 0313) Pulse Rate:  [81-108] 81 (08/11 0313) Resp:  [15-20] 16 (08/11 0313) BP: (106-138)/(47-75) 122/47 (08/11 0313) SpO2:  [91 %-100 %] 93 % (08/11 0313)  Intake/Output from previous day: No intake or output data in the 24 hours ending 12/25/20 0703   Intake/Output this shift: No intake/output data recorded.  Labs: Recent Labs    12/23/20 0408 12/24/20 0434 12/25/20 0449  HGB 8.6* 9.1* 8.2*   Recent Labs    12/24/20 0434 12/25/20 0449  WBC 17.2* 15.3*  RBC 2.97* 2.64*  HCT 27.9* 24.9*  PLT 270 300   Recent Labs    12/24/20 0434 12/25/20 0449  NA 137 139  K 4.6 4.7  CL 109 110  CO2 22 24  BUN 29* 38*  CREATININE 0.86 0.86  GLUCOSE 114* 111*  CALCIUM 8.2* 8.1*   No results for input(s): LABPT, INR in the last 72 hours.    EXAM General - Patient is Alert and Confused Extremity - Neurovascular intact Dorsiflexion/Plantar flexion intact Compartment soft Dressing/Incision - clean, dry, no drainage Motor Function - intact, moving foot and toes well on exam.  Limited physical therapy.  Past Medical History:  Diagnosis Date   Dementia ([place])    Thyroid disease     Assessment/Plan: 3 Days Post-Op Procedure(s) (LRB): ARTHROPLASTY BIPOLAR HIP (HEMIARTHROPLASTY) (Left) Principal Problem:   Closed left hip fracture (HCC) Active Problems:   Fall   Protein calorie malnutrition (HCC)   Leukocytosis   Chronic diastolic CHF (congestive heart failure) (HCC)   Essential  hypertension  Estimated body mass index is 21.53 kg/m as calculated from the following:   Height as of this encounter: '5\' 2"'$  (1.575 m).   Weight as of this encounter: 53.4 kg. Advance diet Up with therapy  Care management with discharge planning  Acute blood loss anemia.  Hemoglobin 8.2.  Follow-up with [facility] clinic orthopedics in 2 weeks for staple removal and x-rays of the left hip.  DVT Prophylaxis - Lovenox, Foot Pumps, and TED hose Weight-Bearing as tolerated to left leg  Reche Dixon, PA-C Orthopaedic Surgery 12/25/2020, 7:03 AM

## 2020-12-25 NOTE — Progress Notes (Signed)
Physical Therapy Treatment Patient Details Name: Aimee Kirk MRN: [id number] DOB: 27-Mar-1923 Today's Date: 12/25/2020    History of Present Illness As per MD H and P: Aimee Kirk is a 87 y.o. female with medical history significant for advanced dementia, chronic diastolic heart failure, moderate mitral regurgitation, who is admitted to [facility] on 12/21/2020 with acute left femoral neck fracture after presenting from ALF to [facility] ED for evaluation of fall.  Per these reports, the patient was bending over to tie her shoe, when she fell forward, landing on her left side, including left hip, as the principal point of contact with the floor. No other complaints. Pt is now S/P L hemiarthroplasty, WBAT to LLE.    PT Comments    Patient received in bed without [place] for O2  in place. Son present int the room. With minimal VC and TC pt aroused and remained awake though out the session. Pt remained fearful and persevered" Pls help me, don't let me fall" Pt needed max assist of 1 for bed mobility and STS transfers.Pt sit to 5 reps 1 mins each time. Pt performed Wt shifting and partial mini squats with max assist of 1 with Max VC for  proper technique to calm the pt from feal of falling. Pt's Son educated to perform SPT with 2+ assist to recliner. Pt ability to participate increased today. pT will continue to assess pt's progress to establish realistic goals. Pt needs Repetitive instructions to increase carryover. PROM exs to ROM and to facilitate AROM. Overall pt tol tx well in chair without distress. Pt will benefit form continued PT interventions.   Follow Up Recommendations  SNF     Equipment Recommendations  Rolling walker with 5" wheels    Recommendations for Other Services       Precautions / Restrictions Precautions Precautions: Fall Precaution Comments: Pt is pleasantly confused and unable to recall new information.  Abductor pillow used when in  bed. Restrictions Weight Bearing Restrictions: Yes RLE Weight Bearing: Weight bearing as tolerated LLE Weight Bearing: Weight bearing as tolerated    Mobility  Bed Mobility Overal bed mobility: Needs Assistance Bed Mobility: Supine to Sit;Sit to Supine Rolling: Max assist   Supine to sit: Max assist Sit to supine: Total assist   General bed mobility comments: Pt remains fearful but ready to participate    Transfers Overall transfer level: Needs assistance Equipment used: Rolling walker (2 wheeled) Transfers: Sit to/from [facility] Sit to Stand: Max assist Stand pivot transfers: +2 physical assistance          Ambulation/Gait         Gait velocity: decreased/freezes due to decreased loading response to LLE.       Stairs             Wheelchair Mobility    Modified Rankin (Stroke Patients Only)       Balance Overall balance assessment: Needs assistance Sitting-balance support: Feet supported;Bilateral upper extremity supported Sitting balance-Leahy Scale: Fair   Postural control: Posterior lean Standing balance support: Bilateral upper extremity supported Standing balance-Leahy Scale: Poor                              Cognition Arousal/Alertness: Lethargic (awake) Behavior During Therapy: Flat affect Overall Cognitive Status: Impaired/Different from baseline  General Comments: hx of dementia      Exercises Total Joint Exercises Ankle Circles/Pumps: 10 reps;PROM Short Arc Quad: AROM;10 reps Long Arc Quad: PROM;AROM;10 reps Knee Flexion: PROM;AROM;10 reps    General Comments        Pertinent Vitals/Pain Pain Assessment: Faces Faces Pain Scale: Hurts little more Pain Location: L hip Pain Descriptors / Indicators: Aching;Discomfort Pain Intervention(s): Monitored during session;Limited activity within patient's tolerance    Home Living                       Prior Function            PT Goals (current goals can now be found in the care plan section) Acute Rehab PT Goals Patient Stated Goal: none stated Potential to Achieve Goals: Fair Progress towards PT goals: Progressing toward goals    Frequency    BID      PT Plan Current plan remains appropriate    Co-evaluation              AM-PAC PT "6 Clicks" Mobility   Outcome Measure                   End of Session Equipment Utilized During Treatment: Gait belt Activity Tolerance: Patient limited by fatigue;Patient limited by pain;Patient limited by lethargy;Patient tolerated treatment well Patient left: in chair;with call bell/phone within reach;with chair alarm set;with family/visitor present Nurse Communication: Mobility status;Patient requests pain meds PT Visit Diagnosis: Unsteadiness on feet (R26.81);Other abnormalities of gait and mobility (R26.89);Muscle weakness (generalized) (M62.81);History of falling (Z91.81);Ataxic gait (R26.0);Difficulty in walking, not elsewhere classified (R26.2)     Time: [id number] PT Time Calculation (min) (ACUTE ONLY): 38 min  Charges:  $Therapeutic Exercise: 8-22 mins $Therapeutic Activity: 23-37 mins                    Briawna Carver PT DPT 1:34 PM,12/25/20

## 2020-12-25 NOTE — Care Management Important Message (Signed)
Important Message  Patient Details  Name: Aimee Kirk MRN: [id number] Date of Birth: 1923-05-11   Medicare Important Message Given:  Yes  HCPOA, [name] returned my call and I reviewed the Important Message from Medicare with him and he was in agreement with the discharge plan. He said, they are waiting for insurance authorization. I asked if he would like me to send a copy of the form and he replied no, as he has the copy from the admission and did not need it.  I wished his mother well and thanked him for his time.  [name] 12/25/2020, 4:12 PM

## 2020-12-25 NOTE — Progress Notes (Signed)
Occupational Therapy Treatment Patient Details Name: Aimee Kirk MRN: [id number] DOB: 03-06-1923 Today's Date: 12/25/2020    History of present illness As per MD H and P: Aimee Kirk is a 87 y.o. female with medical history significant for advanced dementia, chronic diastolic heart failure, moderate mitral regurgitation, who is admitted to [facility] on 12/21/2020 with acute left femoral neck fracture after presenting from ALF to [facility] ED for evaluation of fall.  Per these reports, the patient was bending over to tie her shoe, when she fell forward, landing on her left side, including left hip, as the principal point of contact with the floor. No other complaints. Pt is now S/P L hemiarthroplasty, WBAT to LLE.   OT comments  Upon entering the room, pt supine in bed with eyes closed. RN reports having just cleaned pt and new foley placed. Pt has sweet disposition but very confused. OT attempting to orient pt to situation before bed mobility. Max A to EOB with pt needing min guard for sitting balance while combing hair and washing face. Pt seated for~ 12 minutes and IV team arrived. Pt needing total A to return to bed and abductor pillow placed for safety with precautions. Pt needing mod - max multimodal cuing to initiate and sequence tasks. Son arrived at end of session and therapist answered his questions regarding pt's progress with OT. All needs within reach upon exiting the room.    Follow Up Recommendations  SNF;Supervision/Assistance - 24 hour    Equipment Recommendations  Other (comment) (defer to next venue of care)       Precautions / Restrictions Precautions Precautions: Fall Precaution Comments: Pt is pleasantly confused and unable to recall new information.  Abductor pillow used when in bed. Restrictions Weight Bearing Restrictions: Yes RLE Weight Bearing: Weight bearing as tolerated LLE Weight Bearing: Weight bearing as tolerated       Mobility Bed  Mobility Overal bed mobility: Needs Assistance Bed Mobility: Supine to Sit;Sit to Supine     Supine to sit: Max assist Sit to supine: Total assist   General bed mobility comments: Pt very fearful this session              Balance Overall balance assessment: Needs assistance Sitting-balance support: Feet supported;Bilateral upper extremity supported Sitting balance-Leahy Scale: Fair                                     ADL either performed or assessed with clinical judgement   ADL Overall ADL's : Needs assistance/impaired     Grooming: Wash/dry hands;Wash/dry face;Sitting;Set up;Min guard                                       Vision Patient Visual Report: No change from baseline            Cognition Arousal/Alertness: Lethargic Behavior During Therapy: Flat affect Overall Cognitive Status: Impaired/Different from baseline                                 General Comments: hx of dementia                   Pertinent Vitals/ Pain       Pain Assessment: Faces Faces Pain  Scale: Hurts little more Pain Location: L hip Pain Descriptors / Indicators: Aching;Discomfort Pain Intervention(s): Limited activity within patient's tolerance;Monitored during session;Repositioned                                                Frequency  Min 2X/week        Progress Toward Goals  OT Goals(current goals can now be found in the care plan section)  Progress towards OT goals: Progressing toward goals  Acute Rehab OT Goals Patient Stated Goal: none stated OT Goal Formulation: Patient unable to participate in goal setting Time For Goal Achievement: 01/06/21 Potential to Achieve Goals: Good  Plan Discharge plan remains appropriate;Frequency remains appropriate       AM-PAC OT "6 Clicks" Daily Activity     Outcome Measure   Help from another person eating meals?: A Little Help from another person  taking care of personal grooming?: A Little Help from another person toileting, which includes using toliet, bedpan, or urinal?: A Lot Help from another person bathing (including washing, rinsing, drying)?: A Lot Help from another person to put on and taking off regular upper body clothing?: A Little Help from another person to put on and taking off regular lower body clothing?: A Lot 6 Click Score: 15    End of Session    OT Visit Diagnosis: Unsteadiness on feet (R26.81);Repeated falls (R29.6);Muscle weakness (generalized) (M62.81);History of falling (Z91.81)   Activity Tolerance Patient limited by pain   Patient Left in bed;with call bell/phone within reach;with bed alarm set;Other (comment);with family/visitor present (IV team)   Nurse Communication Mobility status        Time: [id number] OT Time Calculation (min): 23 min  Charges: OT General Charges $OT Visit: 1 Visit OT Treatments $Self Care/Home Management : 23-37 mins  Darleen Crocker, MS, OTR/L , CBIS ascom [phone]  12/25/20, 12:54 PM

## 2020-12-25 NOTE — Progress Notes (Addendum)
Nutrition Follow-up  DOCUMENTATION CODES:  Severe malnutrition in context of social or environmental circumstances  INTERVENTION:  Adjust to puree diet, encourage PO intake.  Pt requires assistance with meals (set-up/feeding) Ensure Enlive po BID, each supplement provides 350 kcal and 20 grams of protein Magic cup TID with meals after diet advancement, each supplement provides 290 kcal and 9 grams of protein 1000 IU of vitamin d daily for deficiency on labs  NUTRITION DIAGNOSIS:  Severe Malnutrition (in the context of social/environmental factors) related to  (inadequate oral intake) as evidenced by severe muscle depletion, severe fat depletion.  GOAL:  Patient will meet greater than or equal to 90% of their needs  MONITOR:  PO intake, Supplement acceptance, Skin  REASON FOR ASSESSMENT:  Consult Assessment of nutrition requirement/status, Malnutrition Eval  ASSESSMENT:  87 y.o. female presented to ED after a fall at her ALF. Hx of advanced dementia, unable to provide a hx but complained of left sided pain. Imaging in ED showed  a displaced left femoral neck fracture. PMH: HTN  8/8 - Op, Left hip hemiarthroplasty  Pt resting in bed at the time of assessment, lethargic and dozing in and out of sleep, did wake during physical assessment but did not speak. Son was at bedside and was able to provide a nutrition hx.   RN reports that pt has not been eating well, seems to be having a hard time chewing and staying awake. Son reports that at her facility, she goes to a dining room for meals and eats a normal consistency. He does however agree that pt is not at her baseline and would benefit from a puree diet. Will adjust to allow for easier feeding.  Discussed nutrition supplements with son. States that pt does like them, but he has not been able to get her to drink much. Did consume one ensure yesterday. Will continue as ordered to be given when pt is alert and awake.   Upon exam, pt very  thin with significant muscle and fat deficits noted. Skin is thin and fragile. Several skin tears present (but dressed). Assessment consistent with severe malnutrition.   Noted that pt's Vitamin D and B12 were low when assessed last week. B12 is ordered to be replaced, will also add Vitamin D supplement to regimen.   Average Meal Intake: 8/7-8/9: 13% intake x 2 recorded meals  Nutritionally Relevant Medications: Scheduled Meds:  cyanocobalamin  1,000 mcg Intramuscular Daily   docusate sodium  100 mg Oral BID   feeding supplement  237 mL Oral BID BM   iron polysaccharides  150 mg Oral Daily   PRN Meds:.bisacodyl, metoCLOPramide, ondansetron, senna-docusate, sodium phosphate  Labs Reviewed: BUN 38 Vitamin B12, 141(8/9) Vitamin D, 17.7 (8/8)  NUTRITION - FOCUSED PHYSICAL EXAM: Flowsheet Row Most Recent Value  Orbital Region Severe depletion  Upper Arm Region Moderate depletion  Thoracic and Lumbar Region Moderate depletion  Buccal Region Severe depletion  Temple Region Severe depletion  Clavicle Bone Region Moderate depletion  Clavicle and Acromion Bone Region Severe depletion  Scapular Bone Region Unable to assess  Dorsal Hand Severe depletion  Patellar Region Mild depletion  [edema]  Anterior Thigh Region Mild depletion  [edema]  Posterior Calf Region Severe depletion  Edema (RD Assessment) Moderate  [thighs, left arm]  Hair Reviewed  Eyes Unable to assess  Mouth Reviewed  Skin Reviewed  [ecchymosis]  Nails Reviewed   Diet Order:   Diet Order  DIET - DYS 1 Room service appropriate? No; Fluid consistency: Thin  Diet effective now                  EDUCATION NEEDS:  No education needs have been identified at this time  Skin:  Skin Assessment: Reviewed RN Assessment (skin tears, surgical incisions)  Last BM:  8/11 - type 7  Height:  Ht Readings from Last 1 Encounters:  12/21/20 '5\' 2"'$  (1.575 m)    Weight:  Wt Readings from Last 1 Encounters:   12/22/20 53.4 kg    Ideal Body Weight:  50 kg  BMI:  Body mass index is 21.53 kg/m.  Estimated Nutritional Needs:  Kcal:  1300-1500 kcal/d Protein:  65-75 g/d Fluid:  >1.5 L/d   Ranell Patrick, RD, LDN Clinical Dietitian Pager on [place]

## 2020-12-26 DIAGNOSIS — E871 Hypo-osmolality and hyponatremia: Secondary | ICD-10-CM

## 2020-12-26 DIAGNOSIS — I5032 Chronic diastolic (congestive) heart failure: Secondary | ICD-10-CM | POA: Diagnosis not present

## 2020-12-26 DIAGNOSIS — S72002A Fracture of unspecified part of neck of left femur, initial encounter for closed fracture: Secondary | ICD-10-CM | POA: Diagnosis not present

## 2020-12-26 LAB — CBC WITH DIFFERENTIAL/PLATELET
Abs Immature Granulocytes: 0.04 10*3/uL (ref 0.00–0.07)
Basophils Absolute: 0 10*3/uL (ref 0.0–0.1)
Basophils Relative: 0 %
Eosinophils Absolute: 0.3 10*3/uL (ref 0.0–0.5)
Eosinophils Relative: 3 %
HCT: 24 % — ABNORMAL LOW (ref 36.0–46.0)
Hemoglobin: 7.8 g/dL — ABNORMAL LOW (ref 12.0–15.0)
Immature Granulocytes: 0 %
Lymphocytes Relative: 10 %
Lymphs Abs: 1.1 10*3/uL (ref 0.7–4.0)
MCH: 31.5 pg (ref 26.0–34.0)
MCHC: 32.5 g/dL (ref 30.0–36.0)
MCV: 96.8 fL (ref 80.0–100.0)
Monocytes Absolute: 0.7 10*3/uL (ref 0.1–1.0)
Monocytes Relative: 7 %
Neutro Abs: 8.4 10*3/uL — ABNORMAL HIGH (ref 1.7–7.7)
Neutrophils Relative %: 80 %
Platelets: 332 10*3/uL (ref 150–400)
RBC: 2.48 MIL/uL — ABNORMAL LOW (ref 3.87–5.11)
RDW: 14.7 % (ref 11.5–15.5)
WBC: 10.5 10*3/uL (ref 4.0–10.5)
nRBC: 0 % (ref 0.0–0.2)

## 2020-12-26 MED ORDER — SODIUM CHLORIDE 0.9 % IV SOLN
300.0000 mg | Freq: Once | INTRAVENOUS | Status: AC
  Administered 2020-12-26: 300 mg via INTRAVENOUS
  Filled 2020-12-26: qty 15

## 2020-12-26 MED ORDER — VITAMIN B-12 1000 MCG PO TABS
1000.0000 ug | ORAL_TABLET | Freq: Every day | ORAL | Status: DC
  Administered 2020-12-26 – 2020-12-31 (×6): 1000 ug via ORAL
  Filled 2020-12-26 (×5): qty 1

## 2020-12-26 NOTE — Progress Notes (Signed)
  Subjective: 4 Days Post-Op Procedure(s) (LRB): ARTHROPLASTY BIPOLAR HIP (HEMIARTHROPLASTY) (Left) Patient reports pain as mild.  Patient agitated. Patient is well, and has had no acute complaints or problems Plan is to go Skilled nursing facility after hospital stay. Negative for chest pain and shortness of breath Fever: no Gastrointestinal: Negative for nausea and vomiting  Objective: Vital signs in last 24 hours: Temp:  [97.9 F (36.6 C)-99.1 F (37.3 C)] 99.1 F (37.3 C) (08/12 0729) Pulse Rate:  [87-109] 98 (08/12 0729) Resp:  [16-20] 16 (08/12 0729) BP: (135-147)/(57-66) 135/63 (08/12 0729) SpO2:  [95 %-98 %] 98 % (08/12 0729)  Intake/Output from previous day:  Intake/Output Summary (Last 24 hours) at 12/26/2020 0931 Last data filed at 12/25/2020 2322 Gross per 24 hour  Intake 120 ml  Output 1100 ml  Net -980 ml     Intake/Output this shift: No intake/output data recorded.  Labs: Recent Labs    12/24/20 0434 12/25/20 0449 12/26/20 0526  HGB 9.1* 8.2* 7.8*   Recent Labs    12/25/20 0449 12/26/20 0526  WBC 15.3* 10.5  RBC 2.64* 2.48*  HCT 24.9* 24.0*  PLT 300 332   Recent Labs    12/24/20 0434 12/25/20 0449  NA 137 139  K 4.6 4.7  CL 109 110  CO2 22 24  BUN 29* 38*  CREATININE 0.86 0.86  GLUCOSE 114* 111*  CALCIUM 8.2* 8.1*   No results for input(s): LABPT, INR in the last 72 hours.    EXAM General - Patient is Alert and Confused Extremity - Neurovascular intact Dorsiflexion/Plantar flexion intact Compartment soft Dressing/Incision - clean, dry, with scant drainage Motor Function - intact, moving foot and toes well on exam.  Limited physical therapy.  Past Medical History:  Diagnosis Date   Dementia ([place])    Thyroid disease     Assessment/Plan: 4 Days Post-Op Procedure(s) (LRB): ARTHROPLASTY BIPOLAR HIP (HEMIARTHROPLASTY) (Left) Principal Problem:   Closed left hip fracture (HCC) Active Problems:   Fall   Protein calorie  malnutrition (HCC)   Leukocytosis   Chronic diastolic CHF (congestive heart failure) (HCC)   Essential hypertension   Protein-calorie malnutrition, severe   Hyponatremia  Estimated body mass index is 21.53 kg/m as calculated from the following:   Height as of this encounter: '5\' 2"'$  (1.575 m).   Weight as of this encounter: 53.4 kg. Advance diet Up with therapy  Care management with discharge planning  Acute blood loss anemia.  Hemoglobin 7.8.  Await Medicine decision for transfusion.    Follow-up with [facility] orthopedics in 2 weeks for staple removal and x-rays of the left hip.  DVT Prophylaxis - Lovenox, Foot Pumps, and TED hose Weight-Bearing as tolerated to left leg  Reche Dixon, PA-C Orthopaedic Surgery 12/26/2020, 9:31 AM

## 2020-12-26 NOTE — TOC Progression Note (Signed)
Transition of Care [facility]) - Progression Note    Patient Details  Name: SHAMEEKA MCMICHAEL MRN: [id number] Date of Birth: 1923/02/10  Transition of Care [facility]) CM/SW [place], RN Phone Number: 12/26/2020, 3:10 PM  Clinical Narrative:   Left 2 messages for [facility] healthcare, no response re: transfer and auth.         Expected Discharge Plan and Services                                                 Social Determinants of Health (SDOH) Interventions    Readmission Risk Interventions No flowsheet data found.

## 2020-12-26 NOTE — Progress Notes (Signed)
PT Cancellation Note  Patient Details Name: Aimee Kirk MRN: [id number] DOB: 06-Sep-1922   Cancelled Treatment:    Reason Eval/Treat Not Completed: Fatigue/lethargy limiting ability to participate.  Pt sleeping in bed upon PT arrival.  Able to briefly wake pt 2x's (and pt able to verbalize name 1x although did not verbalize otherwise) but pt then appearing to fall back asleep.  Therapist and pt's son and daughter in law attempted to wake pt with vc's and tactile cues but unable to get pt awake/alert to participate in therapy (discussed with pt's nurse who reports pt had a bath this morning; pt also with iron infusing currently).  Will re-attempt PT session this afternoon.  Leitha Bleak, PT 12/26/20, 11:30 AM

## 2020-12-26 NOTE — Progress Notes (Signed)
Physical Therapy Treatment Patient Details Name: Aimee Kirk MRN: [id number] DOB: [birth year] Today's Date: 12/26/2020    History of Present Illness As per MD H and P: Aimee Kirk is a 87 y.o. female with medical history significant for advanced dementia, chronic diastolic heart failure, moderate mitral regurgitation, who is admitted to [facility] on 12/21/2020 with acute left femoral neck fracture after presenting from ALF to [facility] ED for evaluation of fall.  Per these reports, the patient was bending over to tie her shoe, when she fell forward, landing on her left side, including left hip, as the principal point of contact with the floor. No other complaints. Pt is now S/P L hemiarthroplasty, WBAT to LLE.    PT Comments    Pt alert upon PT arrival; pt agreeable to activity with encouragement but appearing fearful throughout mobility requiring extra time, re-assurance, and cueing for functional mobility.  Max assist semi-supine to sitting edge of bed; mod assist with transfers; and mod assist to ambulate a few feet bed to recliner with RW use.  Pt had difficulty following cues during activities (complicated d/t pt's HOH) requiring extra time and visual/tactile cues.  Pt appearing comfortable in chair end of session watching television.  PT frequency updated to QD based on pt's therapy participation.  Will continue to focus on strengthening and progressive functional mobility during hospitalization.   Follow Up Recommendations  SNF     Equipment Recommendations  Rolling walker with 5" wheels    Recommendations for Other Services       Precautions / Restrictions Precautions Precautions: Fall;Posterior Hip Precaution Booklet Issued: Yes (comment) Precaution Comments: Abductor pillow used when in bed. Restrictions Weight Bearing Restrictions: Yes RLE Weight Bearing: Weight bearing as tolerated LLE Weight Bearing: Weight bearing as tolerated    Mobility  Bed  Mobility Overal bed mobility: Needs Assistance Bed Mobility: Supine to Sit     Supine to sit: Max assist;HOB elevated     General bed mobility comments: assist for trunk and B LE's (pt appearing fearful and requiring extra time, reassurance, and cueing)    Transfers Overall transfer level: Needs assistance Equipment used: Rolling walker (2 wheeled) Transfers: Sit to/from Stand Sit to Stand: Mod assist         General transfer comment: x2 trials standing from bed; vc's and tactile cues for UE placement; assist to initiate stand from bed and control descent sitting  Ambulation/Gait Ambulation/Gait assistance: Mod assist Gait Distance (Feet): 3 Feet (bed to recliner) Assistive device: Rolling walker (2 wheeled)   Gait velocity: decreased   General Gait Details: decreased stance time L LE; small short steps with max cueing (significant extra time to cue pt to take steps towards recliner required); assist for walker management   Stairs             Wheelchair Mobility    Modified Rankin (Stroke Patients Only)       Balance Overall balance assessment: Needs assistance Sitting-balance support: Single extremity supported;Feet supported Sitting balance-Leahy Scale: Fair Sitting balance - Comments: steady static sitting   Standing balance support: Bilateral upper extremity supported Standing balance-Leahy Scale: Fair Standing balance comment: steady static standing with B UE support on RW                            Cognition Arousal/Alertness: Awake/alert Behavior During Therapy: Anxious Overall Cognitive Status: No family/caregiver present to determine baseline cognitive functioning  General Comments: h/o dementia      Exercises      General Comments        Pertinent Vitals/Pain Pain Assessment: Faces Faces Pain Scale: Hurts a little bit (2/10 at rest; 4/10 with activity) Pain Location: L  hip Pain Descriptors / Indicators: Grimacing;Guarding Pain Intervention(s): Limited activity within patient's tolerance;Monitored during session;Repositioned HR 104 bpm at rest and increased up to 118 bpm with activity; O2 on 2 L via nasal cannula 94% or greater during treatment session.    Home Living                      Prior Function            PT Goals (current goals can now be found in the care plan section) Acute Rehab PT Goals Patient Stated Goal: none stated PT Goal Formulation: With patient/family Time For Goal Achievement: 01/06/21 Potential to Achieve Goals: Fair Progress towards PT goals: Progressing toward goals    Frequency    7X/week      PT Plan Frequency needs to be updated    Co-evaluation              AM-PAC PT "6 Clicks" Mobility   Outcome Measure  Help needed turning from your back to your side while in a flat bed without using bedrails?: A Lot Help needed moving from lying on your back to sitting on the side of a flat bed without using bedrails?: A Lot Help needed moving to and from a bed to a chair (including a wheelchair)?: A Lot Help needed standing up from a chair using your arms (e.g., wheelchair or bedside chair)?: A Lot Help needed to walk in hospital room?: Total Help needed climbing 3-5 steps with a railing? : Total 6 Click Score: 10    End of Session Equipment Utilized During Treatment: Gait belt Activity Tolerance: Patient tolerated treatment well Patient left: in chair;with call bell/phone within reach;with chair alarm set;with SCD's reapplied;Other (comment) (pillow between pt's knees for posterior THP's and B heels floating via pillow support) Nurse Communication: Mobility status;Precautions;Weight bearing status;Other (comment) (Discussed pt's mobility assist levels with NT) PT Visit Diagnosis: Unsteadiness on feet (R26.81);Other abnormalities of gait and mobility (R26.89);Muscle weakness (generalized)  (M62.81);History of falling (Z91.81);Ataxic gait (R26.0);Difficulty in walking, not elsewhere classified (R26.2)     Time: [id number] PT Time Calculation (min) (ACUTE ONLY): 28 min  Charges:  $Therapeutic Activity: 23-37 mins                     Leitha Bleak, PT 12/26/20, 2:19 PM

## 2020-12-26 NOTE — Progress Notes (Signed)
PROGRESS NOTE    Aimee Kirk  [id number] DOB: [birth year] DOA: 12/21/2020 PCP: Orvis Brill, [facility]   Chief complaint.  Left hip pain. Brief Narrative:  Aimee Kirk is a 87 y.o. female with medical history significant for advanced dementia, chronic diastolic heart failure, moderate mitral regurgitation, who is admitted to [facility] on 12/21/2020 with acute left femoral neck fracture after presenting from ALF to [facility] for evaluation of fall.  mages study showed left hip fracture.  Patient had surgery performed on 8/8.   Assessment & Plan:   Principal Problem:   Closed left hip fracture Aimee Kirk) Active Problems:   Fall   Protein calorie malnutrition (HCC)   Leukocytosis   Chronic diastolic CHF (congestive heart failure) (HCC)   Essential hypertension   Protein-calorie malnutrition, severe   Hyponatremia  #1.  Left femoral neck fracture. Mechanical fall. Status post surgery Condition stable, patient pain is under control.  2.  Urinary tract infection. Urinary retention secondary to urinary tract infection. Constipation. Patient received a dose of fosfomycin for UTI.  She also developed urinary retention.  Foley cath was anchored.  She was also started on Flomax.  Constipation was caused by UTI as well as constipation, she had a large bowel movement today.  We will attempt to remove Foley catheter tomorrow prior to discharge if there is a bed available.  3.  Acute blood loss anemia Iron deficient anemia B12 deficient anemia Hemoglobin is lower today.  Recheck level tomorrow.  Patient received 3 days of B12 injection, start oral B12.  I will also give IV iron today.  4.  Chronic diastolic congestive heart failure. Dementia. Stable.  DVT prophylaxis: Lovenox Code Status: DNR Family Communication: son updated Disposition Plan:    Status is: Inpatient  Remains inpatient appropriate because:Inpatient level of care appropriate due to  severity of illness  Dispo: The patient is from: Home              Anticipated d/c is to: SNF              Patient currently is not medically stable to d/c.   Difficult to place patient No        I/O last 3 completed shifts: In: 120 [P.O.:120] Out: 1100 [Urine:1100] No intake/output data recorded.     Consultants:  orthopedics  Procedures: knee  Antimicrobials: none  Subjective: Patient is doing better.  Spoke with the nurse, she had a good dinner last night. She does not have any short of breath or cough. No abdominal pain nausea vomiting.  She had very big bowel movement yesterday just after stool softener. No fever chills  No chest pain palpitation  Objective: Vitals:   12/25/20 2236 12/26/20 0513 12/26/20 0729 12/26/20 1145  BP: (!) 147/60 139/66 135/63 (!) 151/64  Pulse: (!) 109 94 98 88  Resp: '20 18 16 16  '$ Temp: 98.6 F (37 C) 98.7 F (37.1 C) 99.1 F (37.3 C) 98.7 F (37.1 C)  TempSrc:    Oral  SpO2: 95% 97% 98% 100%  Weight:      Height:        Intake/Output Summary (Last 24 hours) at 12/26/2020 1429 Last data filed at 12/26/2020 1043 Gross per 24 hour  Intake 120 ml  Output 400 ml  Net -280 ml   Filed Weights   12/21/20 1600 12/22/20 0500  Weight: 45.4 kg 53.4 kg    Examination:  General exam: Appears calm and comfortable  Respiratory  system: Clear to auscultation. Respiratory effort normal. Cardiovascular system: S1 & S2 heard, RRR. No JVD, murmurs, rubs, gallops or clicks. No pedal edema. Gastrointestinal system: Abdomen is nondistended, soft and nontender. No organomegaly or masses felt. Normal bowel sounds heard. Central nervous system: Alert and oriented x1. No focal neurological deficits. Extremities: Symmetric 5 x 5 power. Skin: No rashes, lesions or ulcers Psychiatry: Mood & affect appropriate.     Data Reviewed: I have personally reviewed following labs and imaging studies  CBC: Recent Labs  Lab 12/21/20 1735  12/22/20 0421 12/23/20 0408 12/24/20 0434 12/25/20 0449 12/26/20 0526  WBC 21.2* 11.9* 13.8* 17.2* 15.3* 10.5  NEUTROABS 19.5* 9.9*  --   --   --  8.4*  HGB 12.2 10.8* 8.6* 9.1* 8.2* 7.8*  HCT 35.9* 32.5* 26.2* 27.9* 24.9* 24.0*  MCV 92.1 93.9 94.2 93.9 94.3 96.8  PLT 275 259 222 270 300 [id number]   Basic Metabolic Panel: Recent Labs  Lab 12/21/20 1735 12/21/20 1935 12/22/20 0421 12/23/20 0408 12/24/20 0434 12/25/20 0449  NA 134*  --  134* 134* 137 139  K 4.3  --  4.2 4.6 4.6 4.7  CL 100  --  101 105 109 110  CO2 27  --  28 21* 22 24  GLUCOSE 143*  --  113* 105* 114* 111*  BUN 19  --  18 21 29* 38*  CREATININE 0.93  --  0.82 0.95 0.86 0.86  CALCIUM 9.0  --  8.5* 7.5* 8.2* 8.1*  MG  --  1.9 2.3  --   --   --    GFR: Estimated Creatinine Clearance: 30.3 mL/min (by C-G formula based on SCr of 0.86 mg/dL). Liver Function Tests: Recent Labs  Lab 12/21/20 1735 12/22/20 0421  AST 36 29  ALT 23 21  ALKPHOS 108 91  BILITOT 1.2 1.5*  PROT 6.9 6.1*  ALBUMIN 4.1 3.5   No results for input(s): LIPASE, AMYLASE in the last 168 hours. No results for input(s): AMMONIA in the last 168 hours. Coagulation Profile: Recent Labs  Lab 12/22/20 0421  INR 1.0   Cardiac Enzymes: No results for input(s): CKTOTAL, CKMB, CKMBINDEX, TROPONINI in the last 168 hours. BNP (last 3 results) No results for input(s): PROBNP in the last 8760 hours. HbA1C: No results for input(s): HGBA1C in the last 72 hours. CBG: No results for input(s): GLUCAP in the last 168 hours. Lipid Profile: No results for input(s): CHOL, HDL, LDLCALC, TRIG, CHOLHDL, LDLDIRECT in the last 72 hours. Thyroid Function Tests: No results for input(s): TSH, T4TOTAL, FREET4, T3FREE, THYROIDAB in the last 72 hours. Anemia Panel: Recent Labs    12/23/20 1439  VITAMINB12 141*  TIBC 211*  IRON 14*   Sepsis Labs: No results for input(s): PROCALCITON, LATICACIDVEN in the last 168 hours.  Recent Results (from the past 240  hour(s))  Resp Panel by RT-PCR (Flu A&B, Covid) Nasopharyngeal Swab     Status: None   Collection Time: 12/21/20  5:35 PM   Specimen: Nasopharyngeal Swab; Nasopharyngeal(NP) swabs in vial transport medium  Result Value Ref Range Status   SARS Coronavirus 2 by RT PCR NEGATIVE NEGATIVE Final    Comment: (NOTE) SARS-CoV-2 target nucleic acids are NOT DETECTED.  The SARS-CoV-2 RNA is generally detectable in upper respiratory specimens during the acute phase of infection. The lowest concentration of SARS-CoV-2 viral copies this assay can detect is 138 copies/mL. A negative result does not preclude SARS-Cov-2 infection and should not be used as the sole  basis for treatment or other patient management decisions. A negative result may occur with  improper specimen collection/handling, submission of specimen other than nasopharyngeal swab, presence of viral mutation(s) within the areas targeted by this assay, and inadequate number of viral copies(<138 copies/mL). A negative result must be combined with clinical observations, patient history, and epidemiological information. The expected result is Negative.  Fact Sheet for Patients:  EntrepreneurPulse.com.au  Fact Sheet for Healthcare Providers:  IncredibleEmployment.be  This test is no t yet approved or cleared by the [place] FDA and  has been authorized for detection and/or diagnosis of SARS-CoV-2 by FDA under an Emergency Use Authorization (EUA). This EUA will remain  in effect (meaning this test can be used) for the duration of the COVID-19 declaration under Section 564(b)(1) of the Act, 21 U.S.C.section 360bbb-3(b)(1), unless the authorization is terminated  or revoked sooner.       Influenza A by PCR NEGATIVE NEGATIVE Final   Influenza B by PCR NEGATIVE NEGATIVE Final    Comment: (NOTE) The Xpert Xpress SARS-CoV-2/FLU/RSV plus assay is intended as an aid in the diagnosis of influenza from  Nasopharyngeal swab specimens and should not be used as a sole basis for treatment. Nasal washings and aspirates are unacceptable for Xpert Xpress SARS-CoV-2/FLU/RSV testing.  Fact Sheet for Patients: EntrepreneurPulse.com.au  Fact Sheet for Healthcare Providers: IncredibleEmployment.be  This test is not yet approved or cleared by the [place] FDA and has been authorized for detection and/or diagnosis of SARS-CoV-2 by FDA under an Emergency Use Authorization (EUA). This EUA will remain in effect (meaning this test can be used) for the duration of the COVID-19 declaration under Section 564(b)(1) of the Act, 21 U.S.C. section 360bbb-3(b)(1), unless the authorization is terminated or revoked.  Performed at [facility], [address], [place], [place] [address]          Radiology Studies: No results found.      Scheduled Meds:  Chlorhexidine Gluconate Cloth  6 each Topical Daily   cholecalciferol  1,000 Units Oral Daily   docusate sodium  100 mg Oral BID   enoxaparin (LOVENOX) injection  30 mg Subcutaneous Q24H   feeding supplement  237 mL Oral BID BM   iron polysaccharides  150 mg Oral Daily   metoprolol tartrate  50 mg Oral BID   tamsulosin  0.4 mg Oral Daily   Continuous Infusions:   LOS: 5 days    Time spent: 27 minutes    Sharen Hones, MD [facility]   To contact the attending provider between 7A-7P or the covering provider during after hours 7P-7A, please log into the web site [email or web address] and access using universal [place] password for that web site. If you do not have the password, please call the hospital operator.  12/26/2020, 2:29 PM

## 2020-12-26 NOTE — TOC Progression Note (Signed)
Transition of Care [facility]) - Progression Note    Patient Details  Name: Aimee Kirk MRN: [id number] Date of Birth: [birth year]  Transition of Care [facility]) CM/SW [place], RN Phone Number: 12/26/2020, 3:16 PM  Clinical Narrative:   per Ebony Hail, patient has [facility] Kaufmann, can go tomorrow.         Expected Discharge Plan and Services                                                 Social Determinants of Health (SDOH) Interventions    Readmission Risk Interventions No flowsheet data found.

## 2020-12-26 NOTE — Progress Notes (Addendum)
[facility] [address] [facility]) Hospitalized Hospice Patient   Aimee Kirk is a current hospice patient with a terminal diagnosis of end stage cerebrovascular disease and vascular dementia.   Aimee Kirk was brought to the emergency room on 8/7 by her son following a fall at her facility. Imaging revealed a left femoral neck fracture, orthopedics were consulted and patient underwent surgical repair 8/8. She is admitted for management of left closed hip fracture. Per Dr. Jewel Baize with [facility], this is a related hospital admission.  Visited patient at bedside. Sleeping in bed. Awakens to voice. Denies pain. Pleasantly confused. Spoke with nurse caring for patient. States patient was awake during bath, but has been sleeping a lot since. Due to low hgb, pt receiving IV iron. Spoke with son Aimee Kirk over the phone. Plan remains to await approval of short term rehab.   Order had been received from Dr. Eulas Post, [facility] physician, patient was no longer general inpatient level of care. Patient was no longer receiving IV medications and remained in the hospital to await approval for short term rehab. Due to decreasing hgb and patient receiving IV iron, order from Dr. Eulas Post that patient has returned to general inpatient level of care.   Vital Signs: 99.1, 135/63, HR 98, RR 16, SPO2 98% on RA  I/O: 120/1100  Abnormal Labs:  RBC 2.48, hgb 7.8, HCT 24.0  Diagnostics: none  IV/PRN Medications: iron sucrose (VENOFER) 300 mg in sodium chloride 0.9 % 250 mL IVPB Dose: 300 mg Once Route: IV, oxycodone 5 mg PO x 2 for pain   Problem List: #1.  Left femoral neck fracture. Mechanical fall. Status post surgery Patient still has confusion, she is affected by nurse.  She is pending for nursing home placement.  2.  Urinary tract infection. Urinary retention secondary to urinary tract infection. Patient received a dose of fosfomycin for UTI yesterday.  Today, she developed urinary  retention, Foley cath was anchored, she was also treated with Flomax.  3.  Acute blood loss anemia Iron deficient anemia B12 deficient anemia Continue injected B12 and oral iron.  Recheck CBC tomorrow   D/C Planning: SNF and revoke hospice per the families wishes  Family: Phone call to son for update   IDT: Updated  GOC: Clear, pursue rehab to see if mobility can be restored   Please do not hesitate to call with any hospice related questions.   Thank you,   Aimee Kirk, Aimee Kirk, Aimee Kirk [facility] Liaison [phone]

## 2020-12-27 DIAGNOSIS — I10 Essential (primary) hypertension: Secondary | ICD-10-CM

## 2020-12-27 DIAGNOSIS — R630 Anorexia: Secondary | ICD-10-CM

## 2020-12-27 DIAGNOSIS — S72002A Fracture of unspecified part of neck of left femur, initial encounter for closed fracture: Secondary | ICD-10-CM | POA: Diagnosis not present

## 2020-12-27 DIAGNOSIS — I5032 Chronic diastolic (congestive) heart failure: Secondary | ICD-10-CM

## 2020-12-27 DIAGNOSIS — J690 Pneumonitis due to inhalation of food and vomit: Secondary | ICD-10-CM

## 2020-12-27 LAB — CBC WITH DIFFERENTIAL/PLATELET
Abs Immature Granulocytes: 0.06 10*3/uL (ref 0.00–0.07)
Basophils Absolute: 0 10*3/uL (ref 0.0–0.1)
Basophils Relative: 0 %
Eosinophils Absolute: 0.3 10*3/uL (ref 0.0–0.5)
Eosinophils Relative: 3 %
HCT: 23.9 % — ABNORMAL LOW (ref 36.0–46.0)
Hemoglobin: 7.7 g/dL — ABNORMAL LOW (ref 12.0–15.0)
Immature Granulocytes: 1 %
Lymphocytes Relative: 12 %
Lymphs Abs: 1.2 10*3/uL (ref 0.7–4.0)
MCH: 31.4 pg (ref 26.0–34.0)
MCHC: 32.2 g/dL (ref 30.0–36.0)
MCV: 97.6 fL (ref 80.0–100.0)
Monocytes Absolute: 0.8 10*3/uL (ref 0.1–1.0)
Monocytes Relative: 8 %
Neutro Abs: 7.3 10*3/uL (ref 1.7–7.7)
Neutrophils Relative %: 76 %
Platelets: 375 10*3/uL (ref 150–400)
RBC: 2.45 MIL/uL — ABNORMAL LOW (ref 3.87–5.11)
RDW: 14.6 % (ref 11.5–15.5)
WBC: 9.6 10*3/uL (ref 4.0–10.5)
nRBC: 0 % (ref 0.0–0.2)

## 2020-12-27 LAB — RESP PANEL BY RT-PCR (FLU A&B, COVID) ARPGX2
Influenza A by PCR: NEGATIVE
Influenza B by PCR: NEGATIVE
SARS Coronavirus 2 by RT PCR: NEGATIVE

## 2020-12-27 MED ORDER — POLYSACCHARIDE IRON COMPLEX 150 MG PO CAPS: 150.0000 mg | ORAL_CAPSULE | Freq: Every day | ORAL | 0 refills | Status: AC

## 2020-12-27 MED ORDER — CYANOCOBALAMIN 1000 MCG PO TABS: 1000.0000 ug | ORAL_TABLET | Freq: Every day | ORAL | 0 refills | Status: AC

## 2020-12-27 MED ORDER — DOCUSATE SODIUM 100 MG PO CAPS: 100.0000 mg | ORAL_CAPSULE | Freq: Two times a day (BID) | ORAL | 0 refills | Status: AC

## 2020-12-27 NOTE — Progress Notes (Signed)
  Subjective: 5 Days Post-Op Procedure(s) (LRB): ARTHROPLASTY BIPOLAR HIP (HEMIARTHROPLASTY) (Left) Patient is resting comfortably this morning. Patient is sleeping without any visible signs of pain. Plan is to go Skilled nursing facility after hospital stay. Patient has had a BM.  Objective: Vital signs in last 24 hours: Temp:  [98.4 F (36.9 C)-98.7 F (37.1 C)] 98.5 F (36.9 C) (08/13 0737) Pulse Rate:  [85-97] 85 (08/13 0737) Resp:  [15-18] 15 (08/13 0737) BP: (119-152)/(51-64) 148/55 (08/13 0737) SpO2:  [95 %-100 %] 95 % (08/13 0737)  Intake/Output from previous day:  Intake/Output Summary (Last 24 hours) at 12/27/2020 0756 Last data filed at 12/27/2020 0559 Gross per 24 hour  Intake 1 ml  Output 1450 ml  Net -1449 ml     Intake/Output this shift: No intake/output data recorded.  Labs: Recent Labs    12/25/20 0449 12/26/20 0526 12/27/20 0450  HGB 8.2* 7.8* 7.7*   Recent Labs    12/26/20 0526 12/27/20 0450  WBC 10.5 9.6  RBC 2.48* 2.45*  HCT 24.0* 23.9*  PLT 332 375   Recent Labs    12/25/20 0449  NA 139  K 4.7  CL 110  CO2 24  BUN 38*  CREATININE 0.86  GLUCOSE 111*  CALCIUM 8.1*   No results for input(s): LABPT, INR in the last 72 hours.    EXAM General - Patient is sleeping comfortably this AM. Extremity - Neurovascular intact Dorsiflexion/Plantar flexion intact Compartment soft Dressing/Incision - clean, dry, with scant drainage Motor Function - intact, moving foot and toes well on exam.  Past Medical History:  Diagnosis Date   Dementia ([place])    Thyroid disease     Assessment/Plan: 5 Days Post-Op Procedure(s) (LRB): ARTHROPLASTY BIPOLAR HIP (HEMIARTHROPLASTY) (Left) Principal Problem:   Closed left hip fracture (HCC) Active Problems:   Fall   Protein calorie malnutrition ([place])   Leukocytosis   Chronic diastolic CHF (congestive heart failure) (HCC)   Essential hypertension   Protein-calorie malnutrition, severe    Hyponatremia  Estimated body mass index is 21.53 kg/m as calculated from the following:   Height as of this encounter: '5\' 2"'$  (1.575 m).   Weight as of this encounter: 53.4 kg. Advance diet Up with therapy  Care management with discharge planning.  Plan is for discharge to SNF today. Hg stable at 7.7 this morning.    Continue Lovenox '40mg'$  daily for 14 days for DVT prophylaxis. Follow-up with [facility] clinic orthopedics in 2 weeks for staple removal and x-rays of the left hip.  DVT Prophylaxis - Lovenox, Foot Pumps, and TED hose Weight-Bearing as tolerated to left leg  J. Cameron Proud, PA-C Orthopaedic Surgery 12/27/2020, 7:56 AM

## 2020-12-27 NOTE — TOC Progression Note (Signed)
Transition of Care [facility]) - Progression Note    Patient Details  Name: Aimee Kirk MRN: [id number] Date of Birth: 1923/01/09  [facility]) CM/SW Contact  Boris Sharper, LCSW Phone Number: 12/27/2020, 2:40 PM  Clinical Narrative:    Pt unable to discharge at this time, due to facility not having quarantine bed available, Quarantine bed should be available on Monday or Tuesday. A new authorization request will need to be initiated from the facility.        Expected Discharge Plan and Services           Expected Discharge Date: 12/27/20                                     Social Determinants of Health (SDOH) Interventions    Readmission Risk Interventions No flowsheet data found.

## 2020-12-27 NOTE — Progress Notes (Signed)
Physical Therapy Treatment Patient Details Name: Aimee Kirk MRN: [id number] DOB: [birth year] Today's Date: 12/27/2020    History of Present Illness As per MD H and P: Aimee Kirk is a 87 y.o. female with medical history significant for advanced dementia, chronic diastolic heart failure, moderate mitral regurgitation, who is admitted to [facility] on 12/21/2020 with acute left femoral neck fracture after presenting from ALF to [facility] ED for evaluation of fall.  Per these reports, the patient was bending over to tie her shoe, when she fell forward, landing on her left side, including left hip, as the principal point of contact with the floor. No other complaints. Pt is now S/P L hemiarthroplasty, WBAT to LLE.    PT Comments    Pt was long sitting in bed upon arriving. She is alert but confused and disoriented. Anxious throughout session but with constant encouragement was willing to participate. She was able to exit bed, stand to RW, and ambulate around EOB to recliner. Author assisted pt with lunch tray set up. Once set up, was able to take bites without assistance. Acute PT continues to recommend DC to rehab to continue to progress to PLOF.    Follow Up Recommendations  SNF     Equipment Recommendations  Rolling walker with 5" wheels       Precautions / Restrictions Precautions Precautions: Fall;Posterior Hip Precaution Booklet Issued: Yes (comment) Precaution Comments: Abductor pillow used when in bed. Restrictions Weight Bearing Restrictions: Yes RLE Weight Bearing: Weight bearing as tolerated LLE Weight Bearing: Weight bearing as tolerated    Mobility  Bed Mobility Overal bed mobility: Needs Assistance Bed Mobility: Supine to Sit Rolling: Max assist Sidelying to sit: Max assist Supine to sit: Max assist     General bed mobility comments: Increased time to perform rolling to short sit. vcs and tactle cues throughout. pt very fearful     Transfers Overall transfer level: Needs assistance Equipment used: Rolling walker (2 wheeled) Transfers: Sit to/from Stand Sit to Stand: Min assist;Mod assist;From elevated surface         General transfer comment: pt was able to stand from elevated bed height with min-mod assist fo one. RW for UE support once in standing.  Ambulation/Gait Ambulation/Gait assistance: Min assist Gait Distance (Feet): 10 Feet Assistive device: Rolling walker (2 wheeled) Gait Pattern/deviations: Step-to pattern;Decreased stride length;Decreased stance time - left;Ataxic;Shuffle Gait velocity: decreased   General Gait Details: Pt was able to ambulate around bed to recliner no LOB however cognition impacts her ability to follow commands    Balance Overall balance assessment: Needs assistance Sitting-balance support: Single extremity supported;Feet supported Sitting balance-Leahy Scale: Good     Standing balance support: Bilateral upper extremity supported Standing balance-Leahy Scale: Fair Standing balance comment: high fall risk due to cognition moreso tahn balance/strength deficits        Cognition Arousal/Alertness: Awake/alert Behavior During Therapy: Anxious Overall Cognitive Status: No family/caregiver present to determine baseline cognitive functioning        General Comments: h/o dementia, Pt very anxious but was cooperative. Cognition greatly impacts session progression             Pertinent Vitals/Pain Pain Assessment: No/denies pain     PT Goals (current goals can now be found in the care plan section) Acute Rehab PT Goals Patient Stated Goal: none stated Progress towards PT goals: Progressing toward goals    Frequency    7X/week      PT Plan Current plan  remains appropriate       AM-PAC PT "6 Clicks" Mobility   Outcome Measure  Help needed turning from your back to your side while in a flat bed without using bedrails?: A Lot Help needed moving from lying  on your back to sitting on the side of a flat bed without using bedrails?: A Lot Help needed moving to and from a bed to a chair (including a wheelchair)?: A Lot Help needed standing up from a chair using your arms (e.g., wheelchair or bedside chair)?: A Lot Help needed to walk in hospital room?: A Lot Help needed climbing 3-5 steps with a railing? : A Lot 6 Click Score: 12    End of Session Equipment Utilized During Treatment: Gait belt Activity Tolerance: Patient tolerated treatment well Patient left: in chair;with call bell/phone within reach;with chair alarm set;with SCD's reapplied;Other (comment) Nurse Communication: Mobility status;Precautions;Weight bearing status;Other (comment) PT Visit Diagnosis: Unsteadiness on feet (R26.81);Other abnormalities of gait and mobility (R26.89);Muscle weakness (generalized) (M62.81);History of falling (Z91.81);Ataxic gait (R26.0);Difficulty in walking, not elsewhere classified (R26.2)     Time: [id number] PT Time Calculation (min) (ACUTE ONLY): 17 min  Charges:  $Therapeutic Activity: 8-22 mins                     Julaine Fusi PTA 12/27/20, 12:59 PM

## 2020-12-27 NOTE — Discharge Summary (Addendum)
Physician Discharge Summary  Patient ID: Aimee Kirk MRN: [id number] DOB/AGE: Oct 08, 1922 87 y.o.  Admit date: 12/21/2020 Discharge date: 12/31/2020    Discharge Diagnoses:  Principal Problem:   Closed left hip fracture [facility]) Active Problems:   Fall   Protein calorie malnutrition (HCC)   Leukocytosis   Chronic diastolic CHF (congestive heart failure) (HCC)   Essential hypertension   Protein-calorie malnutrition, severe   Hyponatremia   Failure to thrive in adult   Anorexia   Aspiration pneumonia of both lower lobes due to gastric secretions [facility])   Discharged Condition: fair  Hospital Course:  Aimee Kirk is a 87 y.o. female with medical history significant for advanced dementia, chronic diastolic heart failure, moderate mitral regurgitation, who is admitted to [facility] on 12/21/2020 with acute left femoral neck fracture after presenting from ALF to [facility] ED for evaluation of fall.  mages study showed left hip fracture.  Patient had surgery performed on 8/8.   Addendum: 8/17- pt has fever, cxr with possible right lower lobe aspiration PNA. Was started on Unasyn. 02 sat on RA is 96%. Will switch unasyn to Augmentin on discharge to complete 5 more days.   #1.  Left femoral neck fracture. Mechanical fall. Status post surgery Condition stable, patient pain is under control. Per Orthopedics: Continue Lovenox '40mg'$  daily for 14 days for DVT prophylaxis. Follow-up with [facility] clinic orthopedics in 2 weeks for staple removal and x-rays of the left hip.   DVT Prophylaxis - Lovenox, Foot Pumps, and TED hose Weight-Bearing as tolerated to left leg  2.  Urinary tract infection. Urinary retention secondary to urinary tract infection. Constipation. Patient received a dose of fosfomycin for UTI.  She also developed urinary retention.  Foley cath was anchored.  She was also started on Flomax.  Constipation was caused by UTI as well as constipation, she had a  large bowel movement.   Will need voiding trial in 2-3 days at SNF  3.  Acute blood loss anemia Iron deficient anemia B12 deficient anemia Hemoglobin is stable at 7.7.  Patient received 3 days of B12 injection, start oral B12.  He also received 1 dose of IV iron.  We will continue oral supplement with B12 and iron.  Please check another CBC in 7 days, transfuse if hemoglobin less than 7.   4.  Chronic diastolic congestive heart failure. Dementia. Stable.  5. Aspiration PNA- on Room air.  To complete 5 more days of Augmentin DIET: Dysphagia I diet (puree)   Consults: orthopedic surgery  Significant Diagnostic Studies:   Treatments: Hip surgery  Discharge Exam: Blood pressure 132/67, pulse 99, temperature 98.6 F (37 C), resp. rate 20, height '5\' 2"'$  (1.575 m), weight 53.4 kg, SpO2 96 %. General appearance: alert and cooperative Resp: clear to auscultation bilaterally Cardio: regular rate and rhythm, S1, S2 normal, no murmur, click, rub or gallop GI: soft, non-tender; bowel sounds normal; no masses,  no organomegaly Extremities: extremities normal, atraumatic, no cyanosis or edema  Disposition:   DIET: Dysphagia I diet. Moderate RISK for Aspiratin  Discharge Instructions     Diet - low sodium heart healthy   Complete by: As directed    Discharge wound care:   Complete by: As directed    RN to follow   Increase activity slowly   Complete by: As directed       Allergies as of 12/31/2020       Reactions   Remdesivir    Pt son  states he absolutely does not want his mother to receive this medication         Medication List     STOP taking these medications    ferrous sulfate 325 (65 FE) MG tablet       TAKE these medications    amoxicillin-clavulanate 875-125 MG tablet Commonly known as: AUGMENTIN Take 1 tablet by mouth every 12 (twelve) hours.   aspirin 81 MG chewable tablet Chew 1 tablet (81 mg total) by mouth daily.   cyanocobalamin 1000 MCG  tablet Take 1 tablet (1,000 mcg total) by mouth daily.   docusate sodium 100 MG capsule Commonly known as: COLACE Take 1 capsule (100 mg total) by mouth 2 (two) times daily.   enoxaparin 40 MG/0.4ML injection Commonly known as: LOVENOX Inject 0.4 mLs (40 mg total) into the skin daily for 14 days.   iron polysaccharides 150 MG capsule Commonly known as: NIFEREX Take 1 capsule (150 mg total) by mouth daily.   metoprolol tartrate 50 MG tablet Commonly known as: LOPRESSOR Take 1 tablet (50 mg total) by mouth 2 (two) times daily.   oxyCODONE 5 MG immediate release tablet Commonly known as: Oxy IR/ROXICODONE Take 0.5-1 tablets (2.5-5 mg total) by mouth every 4 (four) hours as needed for severe pain.   tamsulosin 0.4 MG Caps capsule Commonly known as: FLOMAX Take 1 capsule (0.4 mg total) by mouth daily.   traMADol 50 MG tablet Commonly known as: ULTRAM Take 1 tablet (50 mg total) by mouth every 6 (six) hours as needed for moderate pain.               Discharge Care Instructions  (From admission, onward)           Start     Ordered   12/27/20 0000  Discharge wound care:       Comments: RN to follow   12/27/20 1014            Contact information for follow-up providers     [facility] [place], [place]. Schedule an appointment as soon as possible for a visit in 2 week(s).   Specialty: Orthopedic Surgery Why: For staple removal and x-rays of the left hip Contact information: [address] [place] [address] [phone]              Contact information for after-discharge care     [facility] .   Service: Skilled Nursing Contact information: [address] [place] [phone]                     Signed: Nolberto Hanlon 12/31/2020, 9:33 AM   40 min

## 2020-12-28 DIAGNOSIS — R627 Adult failure to thrive: Secondary | ICD-10-CM

## 2020-12-28 DIAGNOSIS — S72002A Fracture of unspecified part of neck of left femur, initial encounter for closed fracture: Secondary | ICD-10-CM | POA: Diagnosis not present

## 2020-12-28 DIAGNOSIS — R630 Anorexia: Secondary | ICD-10-CM

## 2020-12-28 LAB — CBC WITH DIFFERENTIAL/PLATELET
Abs Immature Granulocytes: 0.15 10*3/uL — ABNORMAL HIGH (ref 0.00–0.07)
Basophils Absolute: 0 10*3/uL (ref 0.0–0.1)
Basophils Relative: 0 %
Eosinophils Absolute: 0.4 10*3/uL (ref 0.0–0.5)
Eosinophils Relative: 4 %
HCT: 24.9 % — ABNORMAL LOW (ref 36.0–46.0)
Hemoglobin: 8.2 g/dL — ABNORMAL LOW (ref 12.0–15.0)
Immature Granulocytes: 1 %
Lymphocytes Relative: 13 %
Lymphs Abs: 1.4 10*3/uL (ref 0.7–4.0)
MCH: 31.7 pg (ref 26.0–34.0)
MCHC: 32.9 g/dL (ref 30.0–36.0)
MCV: 96.1 fL (ref 80.0–100.0)
Monocytes Absolute: 0.8 10*3/uL (ref 0.1–1.0)
Monocytes Relative: 7 %
Neutro Abs: 8.3 10*3/uL — ABNORMAL HIGH (ref 1.7–7.7)
Neutrophils Relative %: 75 %
Platelets: 467 10*3/uL — ABNORMAL HIGH (ref 150–400)
RBC: 2.59 MIL/uL — ABNORMAL LOW (ref 3.87–5.11)
RDW: 14.6 % (ref 11.5–15.5)
WBC: 11.1 10*3/uL — ABNORMAL HIGH (ref 4.0–10.5)
nRBC: 0.3 % — ABNORMAL HIGH (ref 0.0–0.2)

## 2020-12-28 MED ORDER — LACTATED RINGERS IV SOLN: INTRAVENOUS | Status: DC

## 2020-12-28 NOTE — Progress Notes (Signed)
  Subjective: 6 Days Post-Op Procedure(s) (LRB): ARTHROPLASTY BIPOLAR HIP (HEMIARTHROPLASTY) (Left) Patient is resting comfortably this morning. Patient is sleeping without any visible signs of pain. Plan is to go Skilled nursing facility after hospital stay. Patient has had a BM.  Objective: Vital signs in last 24 hours: Temp:  [98.2 F (36.8 C)-98.7 F (37.1 C)] 98.3 F (36.8 C) (08/14 0444) Pulse Rate:  [89-101] 96 (08/14 0444) Resp:  [15-20] 20 (08/14 0444) BP: (132-147)/(55-60) 132/56 (08/14 0444) SpO2:  [96 %-99 %] 97 % (08/14 0444)  Intake/Output from previous day: No intake or output data in the 24 hours ending 12/28/20 0808    Intake/Output this shift: No intake/output data recorded.  Labs: Recent Labs    12/26/20 0526 12/27/20 0450  HGB 7.8* 7.7*   Recent Labs    12/26/20 0526 12/27/20 0450  WBC 10.5 9.6  RBC 2.48* 2.45*  HCT 24.0* 23.9*  PLT 332 375   No results for input(s): NA, K, CL, CO2, BUN, CREATININE, GLUCOSE, CALCIUM in the last 72 hours.  No results for input(s): LABPT, INR in the last 72 hours.    EXAM General - Patient is sleeping comfortably this AM. Extremity - Neurovascular intact Dorsiflexion/Plantar flexion intact Compartment soft Dressing/Incision - Old drainage noted to the left hip, no erythema or signs of infection.  Honeycomb intact. Motor Function - intact, moving foot and toes well on exam.  Past Medical History:  Diagnosis Date   Dementia ([place])    Thyroid disease     Assessment/Plan: 6 Days Post-Op Procedure(s) (LRB): ARTHROPLASTY BIPOLAR HIP (HEMIARTHROPLASTY) (Left) Principal Problem:   Closed left hip fracture (HCC) Active Problems:   Fall   Protein calorie malnutrition ([place])   Leukocytosis   Chronic diastolic CHF (congestive heart failure) (HCC)   Essential hypertension   Protein-calorie malnutrition, severe   Hyponatremia  Estimated body mass index is 21.53 kg/m as calculated from the following:    Height as of this encounter: '5\' 2"'$  (1.575 m).   Weight as of this encounter: 53.4 kg. Advance diet Up with therapy   Care management with discharge planning.  Plan is for discharge to SNF when available. Continue Lovenox '40mg'$  daily for 14 days for DVT prophylaxis. Follow-up with [facility] clinic orthopedics in 2 weeks for staple removal and x-rays of the left hip.  DVT Prophylaxis - Lovenox, Foot Pumps, and TED hose Weight-Bearing as tolerated to left leg  J. Cameron Proud, PA-C Orthopaedic Surgery 12/28/2020, 8:08 AM

## 2020-12-28 NOTE — Plan of Care (Signed)
No acute events this shift. °Problem: Education: °Goal: Knowledge of General Education information will improve °Description: Including pain rating scale, medication(s)/side effects and non-pharmacologic comfort measures °Outcome: Progressing °  °Problem: Health Behavior/Discharge Planning: °Goal: Ability to manage health-related needs will improve °Outcome: Progressing °  °Problem: Clinical Measurements: °Goal: Ability to maintain clinical measurements within normal limits will improve °Outcome: Progressing °Goal: Will remain free from infection °Outcome: Progressing °Goal: Diagnostic test results will improve °Outcome: Progressing °Goal: Respiratory complications will improve °Outcome: Progressing °Goal: Cardiovascular complication will be avoided °Outcome: Progressing °  °Problem: Activity: °Goal: Risk for activity intolerance will decrease °Outcome: Progressing °  °Problem: Nutrition: °Goal: Adequate nutrition will be maintained °Outcome: Progressing °  °Problem: Coping: °Goal: Level of anxiety will decrease °Outcome: Progressing °  °Problem: Elimination: °Goal: Will not experience complications related to bowel motility °Outcome: Progressing °Goal: Will not experience complications related to urinary retention °Outcome: Progressing °  °Problem: Pain Managment: °Goal: General experience of comfort will improve °Outcome: Progressing °  °Problem: Safety: °Goal: Ability to remain free from injury will improve °Outcome: Progressing °  °Problem: Skin Integrity: °Goal: Risk for impaired skin integrity will decrease °Outcome: Progressing °  °

## 2020-12-28 NOTE — Progress Notes (Addendum)
PROGRESS NOTE    Aimee Kirk  [id number] DOB: 07/27/1922 DOA: 12/21/2020 PCP: Orvis Brill, [facility]   Chief complaint.  Left hip pain. Brief Narrative:   Aimee Kirk is a 87 y.o. female with medical history significant for advanced dementia, chronic diastolic heart failure, moderate mitral regurgitation, who is admitted to [facility] on 12/21/2020 with acute left femoral neck fracture after presenting from ALF to [facility] ED for evaluation of fall.  Images study showed left hip fracture.  Patient had surgery performed on 8/8.  Assessment & Plan:   Principal Problem:   Closed left hip fracture [facility]) Active Problems:   Fall   Protein calorie malnutrition (HCC)   Leukocytosis   Chronic diastolic CHF (congestive heart failure) (HCC)   Essential hypertension   Protein-calorie malnutrition, severe   Hyponatremia  #1.  Left femoral neck fracture. Mechanical fall. Status post surgery Patient condition is stable.  2.  Failure to thrive. Anorexia. Sinus tachycardia. Patient developed sinus tachycardia due to dehydration.  Patient has very poor appetite, she is fed by a nurse, but still has very limited p.o. intake.  She developed sinus tachycardia again today due to dehydration.  I will start fluids and recheck level tomorrow.  Urinary tract infection. Urinary retention secondary to urinary tract infection. Constipation. Conditions has resolved.  Acute blood loss anemia Iron deficient anemia B12 deficient anemia Hemoglobin is better.  Chronic diastolic congestive heart failure. Dementia. No volume overload.  Patient condition appears to be terminal, she is 87 years old with a severe dementia.  She has not been eating, or drinking.  She gets dehydrated pretty quickly.  Call to the son's phone without response.  I believe patient should be put back on hospice, will discuss with the family.   DVT prophylaxis: lovenox Code Status: dnr Family  Communication:  Disposition Plan:    Status is: Inpatient  Remains inpatient appropriate because:IV treatments appropriate due to intensity of illness or inability to take PO and Inpatient level of care appropriate due to severity of illness  Dispo: The patient is from: Home              Anticipated d/c is to: SNF              Patient currently is not medically stable to d/c.   Difficult to place patient No        I/O last 3 completed shifts: In: 1 [P.O.:1] Out: 800 [Urine:800] No intake/output data recorded.     Consultants:  Orthopedics  Procedures: Hip  Antimicrobials: None   Subjective: Patient was not able to be discharged yesterday, SNF will not accept patient due to the fact that the patient did not have COVID-vaccine.  Patient developed significant tachycardia, she is very confused.  She has not been ambulating.  She has very poor appetite, she does not drink or eat voluntarily. Does not have any short of breath or cough. No abdominal pain nausea vomiting or No fever or chills.  Objective: Vitals:   12/27/20 2116 12/28/20 0444 12/28/20 0811 12/28/20 1205  BP: (!) 147/60 (!) 132/56 (!) 133/50 (!) 127/58  Pulse: (!) 101 96 70 (!) 118  Resp: '18 20 16 13  '$ Temp: 98.7 F (37.1 C) 98.3 F (36.8 C) 98.8 F (37.1 C) 98.9 F (37.2 C)  TempSrc:    Axillary  SpO2: 99% 97% 99% 97%  Weight:      Height:       No intake or output  data in the 24 hours ending 12/28/20 1219 Filed Weights   12/21/20 1600 12/22/20 0500  Weight: 45.4 kg 53.4 kg    Examination:  General exam: Appears calm and comfortable, ill appearing Respiratory system: Clear to auscultation. Respiratory effort normal. Cardiovascular system: S1 & S2 heard, RRR. No JVD, murmurs, rubs, gallops or clicks. No pedal edema. Gastrointestinal system: Abdomen is nondistended, soft and nontender. No organomegaly or masses felt. Normal bowel sounds heard. Central nervous system: Drowsy and very confused.  No focal neurological deficits. Extremities: Symmetric 5 x 5 power. Skin: No rashes, lesions or ulcers      Data Reviewed: I have personally reviewed following labs and imaging studies  CBC: Recent Labs  Lab 12/21/20 1735 12/22/20 0421 12/23/20 0408 12/24/20 0434 12/25/20 0449 12/26/20 0526 12/27/20 0450 12/28/20 0751  WBC 21.2* 11.9*   < > 17.2* 15.3* 10.5 9.6 11.1*  NEUTROABS 19.5* 9.9*  --   --   --  8.4* 7.3 8.3*  HGB 12.2 10.8*   < > 9.1* 8.2* 7.8* 7.7* 8.2*  HCT 35.9* 32.5*   < > 27.9* 24.9* 24.0* 23.9* 24.9*  MCV 92.1 93.9   < > 93.9 94.3 96.8 97.6 96.1  PLT 275 259   < > 270 300 332 375 467*   < > = values in this interval not displayed.   Basic Metabolic Panel: Recent Labs  Lab 12/21/20 1735 12/21/20 1935 12/22/20 0421 12/23/20 0408 12/24/20 0434 12/25/20 0449  NA 134*  --  134* 134* 137 139  K 4.3  --  4.2 4.6 4.6 4.7  CL 100  --  101 105 109 110  CO2 27  --  28 21* 22 24  GLUCOSE 143*  --  113* 105* 114* 111*  BUN 19  --  18 21 29* 38*  CREATININE 0.93  --  0.82 0.95 0.86 0.86  CALCIUM 9.0  --  8.5* 7.5* 8.2* 8.1*  MG  --  1.9 2.3  --   --   --    GFR: Estimated Creatinine Clearance: 30.3 mL/min (by C-G formula based on SCr of 0.86 mg/dL). Liver Function Tests: Recent Labs  Lab 12/21/20 1735 12/22/20 0421  AST 36 29  ALT 23 21  ALKPHOS 108 91  BILITOT 1.2 1.5*  PROT 6.9 6.1*  ALBUMIN 4.1 3.5   No results for input(s): LIPASE, AMYLASE in the last 168 hours. No results for input(s): AMMONIA in the last 168 hours. Coagulation Profile: Recent Labs  Lab 12/22/20 0421  INR 1.0   Cardiac Enzymes: No results for input(s): CKTOTAL, CKMB, CKMBINDEX, TROPONINI in the last 168 hours. BNP (last 3 results) No results for input(s): PROBNP in the last 8760 hours. HbA1C: No results for input(s): HGBA1C in the last 72 hours. CBG: No results for input(s): GLUCAP in the last 168 hours. Lipid Profile: No results for input(s): CHOL, HDL, LDLCALC,  TRIG, CHOLHDL, LDLDIRECT in the last 72 hours. Thyroid Function Tests: No results for input(s): TSH, T4TOTAL, FREET4, T3FREE, THYROIDAB in the last 72 hours. Anemia Panel: No results for input(s): VITAMINB12, FOLATE, FERRITIN, TIBC, IRON, RETICCTPCT in the last 72 hours. Sepsis Labs: No results for input(s): PROCALCITON, LATICACIDVEN in the last 168 hours.  Recent Results (from the past 240 hour(s))  Resp Panel by RT-PCR (Flu A&B, Covid) Nasopharyngeal Swab     Status: None   Collection Time: 12/21/20  5:35 PM   Specimen: Nasopharyngeal Swab; Nasopharyngeal(NP) swabs in vial transport medium  Result Value Ref Range  Status   SARS Coronavirus 2 by RT PCR NEGATIVE NEGATIVE Final    Comment: (NOTE) SARS-CoV-2 target nucleic acids are NOT DETECTED.  The SARS-CoV-2 RNA is generally detectable in upper respiratory specimens during the acute phase of infection. The lowest concentration of SARS-CoV-2 viral copies this assay can detect is 138 copies/mL. A negative result does not preclude SARS-Cov-2 infection and should not be used as the sole basis for treatment or other patient management decisions. A negative result may occur with  improper specimen collection/handling, submission of specimen other than nasopharyngeal swab, presence of viral mutation(s) within the areas targeted by this assay, and inadequate number of viral copies(<138 copies/mL). A negative result must be combined with clinical observations, patient history, and epidemiological information. The expected result is Negative.  Fact Sheet for Patients:  EntrepreneurPulse.com.au  Fact Sheet for Healthcare Providers:  IncredibleEmployment.be  This test is no t yet approved or cleared by the [place] FDA and  has been authorized for detection and/or diagnosis of SARS-CoV-2 by FDA under an Emergency Use Authorization (EUA). This EUA will remain  in effect (meaning this test can be  used) for the duration of the COVID-19 declaration under Section 564(b)(1) of the Act, 21 U.S.C.section 360bbb-3(b)(1), unless the authorization is terminated  or revoked sooner.       Influenza A by PCR NEGATIVE NEGATIVE Final   Influenza B by PCR NEGATIVE NEGATIVE Final    Comment: (NOTE) The Xpert Xpress SARS-CoV-2/FLU/RSV plus assay is intended as an aid in the diagnosis of influenza from Nasopharyngeal swab specimens and should not be used as a sole basis for treatment. Nasal washings and aspirates are unacceptable for Xpert Xpress SARS-CoV-2/FLU/RSV testing.  Fact Sheet for Patients: EntrepreneurPulse.com.au  Fact Sheet for Healthcare Providers: IncredibleEmployment.be  This test is not yet approved or cleared by the [place] FDA and has been authorized for detection and/or diagnosis of SARS-CoV-2 by FDA under an Emergency Use Authorization (EUA). This EUA will remain in effect (meaning this test can be used) for the duration of the COVID-19 declaration under Section 564(b)(1) of the Act, 21 U.S.C. section 360bbb-3(b)(1), unless the authorization is terminated or revoked.  Performed at [facility], [place], [place] [address]   Resp Panel by RT-PCR (Flu A&B, Covid) Nasopharyngeal Swab     Status: None   Collection Time: 12/27/20 10:45 AM   Specimen: Nasopharyngeal Swab; Nasopharyngeal(NP) swabs in vial transport medium  Result Value Ref Range Status   SARS Coronavirus 2 by RT PCR NEGATIVE NEGATIVE Final    Comment: (NOTE) SARS-CoV-2 target nucleic acids are NOT DETECTED.  The SARS-CoV-2 RNA is generally detectable in upper respiratory specimens during the acute phase of infection. The lowest concentration of SARS-CoV-2 viral copies this assay can detect is 138 copies/mL. A negative result does not preclude SARS-Cov-2 infection and should not be used as the sole basis for treatment or other patient  management decisions. A negative result may occur with  improper specimen collection/handling, submission of specimen other than nasopharyngeal swab, presence of viral mutation(s) within the areas targeted by this assay, and inadequate number of viral copies(<138 copies/mL). A negative result must be combined with clinical observations, patient history, and epidemiological information. The expected result is Negative.  Fact Sheet for Patients:  EntrepreneurPulse.com.au  Fact Sheet for Healthcare Providers:  IncredibleEmployment.be  This test is no t yet approved or cleared by the [place] FDA and  has been authorized for detection and/or diagnosis of SARS-CoV-2 by  FDA under an Emergency Use Authorization (EUA). This EUA will remain  in effect (meaning this test can be used) for the duration of the COVID-19 declaration under Section 564(b)(1) of the Act, 21 U.S.C.section 360bbb-3(b)(1), unless the authorization is terminated  or revoked sooner.       Influenza A by PCR NEGATIVE NEGATIVE Final   Influenza B by PCR NEGATIVE NEGATIVE Final    Comment: (NOTE) The Xpert Xpress SARS-CoV-2/FLU/RSV plus assay is intended as an aid in the diagnosis of influenza from Nasopharyngeal swab specimens and should not be used as a sole basis for treatment. Nasal washings and aspirates are unacceptable for Xpert Xpress SARS-CoV-2/FLU/RSV testing.  Fact Sheet for Patients: EntrepreneurPulse.com.au  Fact Sheet for Healthcare Providers: IncredibleEmployment.be  This test is not yet approved or cleared by the [place] FDA and has been authorized for detection and/or diagnosis of SARS-CoV-2 by FDA under an Emergency Use Authorization (EUA). This EUA will remain in effect (meaning this test can be used) for the duration of the COVID-19 declaration under Section 564(b)(1) of the Act, 21 U.S.C. section 360bbb-3(b)(1),  unless the authorization is terminated or revoked.  Performed at [facility], [address], [place], [place] [address]          Radiology Studies: No results found.      Scheduled Meds:  Chlorhexidine Gluconate Cloth  6 each Topical Daily   cholecalciferol  1,000 Units Oral Daily   docusate sodium  100 mg Oral BID   enoxaparin (LOVENOX) injection  30 mg Subcutaneous Q24H   feeding supplement  237 mL Oral BID BM   iron polysaccharides  150 mg Oral Daily   metoprolol tartrate  50 mg Oral BID   tamsulosin  0.4 mg Oral Daily   vitamin B-12  1,000 mcg Oral Daily   Continuous Infusions:  lactated ringers       LOS: 7 days    Time spent: 32 minutes    Sharen Hones, MD [facility]   To contact the attending provider between 7A-7P or the covering provider during after hours 7P-7A, please log into the web site [email or web address] and access using universal [place] password for that web site. If you do not have the password, please call the hospital operator.  12/28/2020, 12:19 PM

## 2020-12-28 NOTE — Progress Notes (Signed)
Physical Therapy Treatment Patient Details Name: Aimee Kirk MRN: [id number] DOB: November 18, 1922 Today's Date: 12/28/2020    History of Present Illness As per MD H and P: Aimee Kirk is a 87 y.o. female with medical history significant for advanced dementia, chronic diastolic heart failure, moderate mitral regurgitation, who is admitted to [facility] on 12/21/2020 with acute left femoral neck fracture after presenting from ALF to [facility] ED for evaluation of fall.  Per these reports, the patient was bending over to tie her shoe, when she fell forward, landing on her left side, including left hip, as the principal point of contact with the floor. No other complaints. Pt is now S/P L hemiarthroplasty, WBAT to LLE.    PT Comments    LLE AAROM for comfort.  Pt appears generally anxious in bed (seems to be baseline per prior notes).  HR checked 129 at rest.  Discussed with RN who is in to give meds along with some to address HR.  Returned at 11:58 in hopes to get pt up for lunch.  HR remains elevated 123 at rest.  RN aware.  Given age (89) and max HR protocol, mobility deferred at this time.     Follow Up Recommendations  SNF     Equipment Recommendations       Recommendations for Other Services       Precautions / Restrictions Precautions Precautions: Fall;Posterior Hip Precaution Booklet Issued: Yes (comment) Precaution Comments: Abductor pillow used when in bed. Restrictions Weight Bearing Restrictions: Yes RLE Weight Bearing: Weight bearing as tolerated LLE Weight Bearing: Weight bearing as tolerated    Mobility  Bed Mobility                    Transfers                    Ambulation/Gait                 Stairs             Wheelchair Mobility    Modified Rankin (Stroke Patients Only)       Balance                                            Cognition Arousal/Alertness: Awake/alert Behavior  During Therapy: Anxious Overall Cognitive Status: No family/caregiver present to determine baseline cognitive functioning                                        Exercises Other Exercises Other Exercises: LLE AAROM for comfort    General Comments        Pertinent Vitals/Pain Pain Assessment: No/denies pain Pain Location: denies Pain Intervention(s): Monitored during session    Home Living                      Prior Function            PT Goals (current goals can now be found in the care plan section) Progress towards PT goals: Progressing toward goals    Frequency    7X/week      PT Plan Current plan remains appropriate    Co-evaluation  AM-PAC PT "6 Clicks" Mobility   Outcome Measure  Help needed turning from your back to your side while in a flat bed without using bedrails?: A Lot Help needed moving from lying on your back to sitting on the side of a flat bed without using bedrails?: A Lot Help needed moving to and from a bed to a chair (including a wheelchair)?: A Lot Help needed standing up from a chair using your arms (e.g., wheelchair or bedside chair)?: A Lot Help needed to walk in hospital room?: A Lot Help needed climbing 3-5 steps with a railing? : A Lot 6 Click Score: 12    End of Session   Activity Tolerance: Treatment limited secondary to medical complications (Comment) Patient left: in bed;with call bell/phone within reach;with bed alarm set Nurse Communication: Mobility status;Other (comment) PT Visit Diagnosis: Unsteadiness on feet (R26.81);Other abnormalities of gait and mobility (R26.89);Muscle weakness (generalized) (M62.81);History of falling (Z91.81);Ataxic gait (R26.0);Difficulty in walking, not elsewhere classified (R26.2)     Time: 0955-1006 PT Time Calculation (min) (ACUTE ONLY): 11 min  Charges:  $Therapeutic Exercise: 8-22 mins                    Chesley Noon, PTA 12/28/20, 12:32  PM 12/28/2020, 12:30 PM

## 2020-12-29 DIAGNOSIS — S72002A Fracture of unspecified part of neck of left femur, initial encounter for closed fracture: Secondary | ICD-10-CM | POA: Diagnosis not present

## 2020-12-29 DIAGNOSIS — R627 Adult failure to thrive: Secondary | ICD-10-CM | POA: Diagnosis not present

## 2020-12-29 LAB — BASIC METABOLIC PANEL
Anion gap: 8 (ref 5–15)
BUN: 26 mg/dL — ABNORMAL HIGH (ref 8–23)
CO2: 26 mmol/L (ref 22–32)
Calcium: 8.4 mg/dL — ABNORMAL LOW (ref 8.9–10.3)
Chloride: 109 mmol/L (ref 98–111)
Creatinine, Ser: 0.63 mg/dL (ref 0.44–1.00)
GFR, Estimated: >60 mL/min (ref >60)
Glucose, Bld: 110 mg/dL — ABNORMAL HIGH (ref 70–99)
Potassium: 4.4 mmol/L (ref 3.5–5.1)
Sodium: 143 mmol/L (ref 135–145)

## 2020-12-29 LAB — CBC WITH DIFFERENTIAL/PLATELET
Abs Immature Granulocytes: 0.27 10*3/uL — ABNORMAL HIGH (ref 0.00–0.07)
Basophils Absolute: 0.1 10*3/uL (ref 0.0–0.1)
Basophils Relative: 0 %
Eosinophils Absolute: 0.3 10*3/uL (ref 0.0–0.5)
Eosinophils Relative: 2 %
HCT: 26.5 % — ABNORMAL LOW (ref 36.0–46.0)
Hemoglobin: 8.4 g/dL — ABNORMAL LOW (ref 12.0–15.0)
Immature Granulocytes: 2 %
Lymphocytes Relative: 9 %
Lymphs Abs: 1.3 10*3/uL (ref 0.7–4.0)
MCH: 30.8 pg (ref 26.0–34.0)
MCHC: 31.7 g/dL (ref 30.0–36.0)
MCV: 97.1 fL (ref 80.0–100.0)
Monocytes Absolute: 1.1 10*3/uL — ABNORMAL HIGH (ref 0.1–1.0)
Monocytes Relative: 8 %
Neutro Abs: 10.9 10*3/uL — ABNORMAL HIGH (ref 1.7–7.7)
Neutrophils Relative %: 79 %
Platelets: 541 10*3/uL — ABNORMAL HIGH (ref 150–400)
RBC: 2.73 MIL/uL — ABNORMAL LOW (ref 3.87–5.11)
RDW: 15 % (ref 11.5–15.5)
WBC: 13.9 10*3/uL — ABNORMAL HIGH (ref 4.0–10.5)
nRBC: 0.1 % (ref 0.0–0.2)

## 2020-12-29 LAB — MAGNESIUM: Magnesium: 2.2 mg/dL (ref 1.7–2.4)

## 2020-12-29 NOTE — Progress Notes (Signed)
PROGRESS NOTE    Aimee Kirk  [id number] DOB: 09/10/1922 DOA: 12/21/2020 PCP: Orvis Brill, [facility]    Brief Narrative:  Aimee Kirk is a 87 y.o. female with medical history significant for advanced dementia, chronic diastolic heart failure, moderate mitral regurgitation, who is admitted to [facility] on 12/21/2020 with acute left femoral neck fracture after presenting from ALF to [facility] ED for evaluation of fall.  Images study showed left hip fracture.  Patient had surgery performed on 8/8.   Assessment & Plan:   Principal Problem:   Closed left hip fracture [facility]) Active Problems:   Fall   Protein calorie malnutrition (HCC)   Leukocytosis   Chronic diastolic CHF (congestive heart failure) (HCC)   Essential hypertension   Protein-calorie malnutrition, severe   Hyponatremia   Failure to thrive in adult   Anorexia  #1.  Left femoral neck fracture. Mechanical fall. Status post surgery Patient still has some pain, taking pain medicine.  2.  Failure to thrive. Anorexia. Sinus tachycardia. Patient received fluids overnight, tachycardia is getting better.  Overall patient has poor prognosis.   3. Urinary tract infection. Urinary retention secondary to urinary tract infection. Constipation. Patient has completed antibiotics, repeated bladder scan did not show any additional urinary retention.  Condition has resolved.  4. Acute blood loss anemia Iron deficient anemia B12 deficient anemia Continue iron and B12 supplements.   5. Chronic diastolic congestive heart failure. Dementia. Stable.      DVT prophylaxis: lovenox Code Status: dnr Family Communication: son is updated Disposition Plan:      Status is: Inpatient   Remains inpatient appropriate because:IV treatments appropriate due to intensity of illness or inability to take PO and Inpatient level of care appropriate due to severity of illness   Dispo: The patient is from:  Home              Anticipated d/c is to: SNF              Patient currently is medically stable to d/c. Unsafe discharge.              Difficult to place patient No Patient nursing home with denied accepting patient due to patient was not maximized on Friday.  Now they are saying that patient need recertification from insurance company.     I/O last 3 completed shifts: In: 766 [I.V.:766] Out: -  No intake/output data recorded.    Consultants:  Orthopedics   Procedures: Hip   Antimicrobials: None      Subjective: Patient received fluids overnight, tachycardia is better.  She still has a poor appetite, but no nausea vomiting. She does not have short of breath or hypoxia. No abdominal pain. No dysuria hematuria No fever or chills.  Objective: Vitals:   12/28/20 2015 12/28/20 2235 12/29/20 0521 12/29/20 0726  BP: (!) 114/59 127/60 140/68 (!) 157/64  Pulse: (!) 109 (!) 104 (!) 106 (!) 108  Resp: '20  19 20  '$ Temp: 98.5 F (36.9 C)  98 F (36.7 C) 99 F (37.2 C)  TempSrc:    Oral  SpO2: 90%  94% 96%  Weight:      Height:        Intake/Output Summary (Last 24 hours) at 12/29/2020 0953 Last data filed at 12/29/2020 0300 Gross per 24 hour  Intake 766.01 ml  Output --  Net 766.01 ml   Filed Weights   12/21/20 1600 12/22/20 0500  Weight: 45.4 kg 53.4 kg  Examination:  General exam: Appears calm and comfortable , frail Respiratory system: Clear to auscultation. Respiratory effort normal. Cardiovascular system: S1 & S2 heard, RRR. No JVD, murmurs, rubs, gallops or clicks. No pedal edema. Gastrointestinal system: Abdomen is nondistended, soft and nontender. No organomegaly or masses felt. Normal bowel sounds heard. Central nervous system: Alert and oriented x1. No focal neurological deficits. Extremities: Symmetric 5 x 5 power. Skin: No rashes, lesions or ulcers Psychiatry: Judgement and insight appear normal. Mood & affect appropriate.     Data Reviewed: I  have personally reviewed following labs and imaging studies  CBC: Recent Labs  Lab 12/25/20 0449 12/26/20 0526 12/27/20 0450 12/28/20 0751 12/29/20 0602  WBC 15.3* 10.5 9.6 11.1* 13.9*  NEUTROABS  --  8.4* 7.3 8.3* 10.9*  HGB 8.2* 7.8* 7.7* 8.2* 8.4*  HCT 24.9* 24.0* 23.9* 24.9* 26.5*  MCV 94.3 96.8 97.6 96.1 97.1  PLT 300 332 375 467* [id number]*   Basic Metabolic Panel: Recent Labs  Lab 12/23/20 0408 12/24/20 0434 12/25/20 0449 12/29/20 0602  NA 134* 137 139 143  K 4.6 4.6 4.7 4.4  CL 105 109 110 109  CO2 21* '22 24 26  '$ GLUCOSE 105* 114* 111* 110*  BUN 21 29* 38* 26*  CREATININE 0.95 0.86 0.86 0.63  CALCIUM 7.5* 8.2* 8.1* 8.4*  MG  --   --   --  2.2   GFR: Estimated Creatinine Clearance: 32.5 mL/min (by C-G formula based on SCr of 0.63 mg/dL). Liver Function Tests: No results for input(s): AST, ALT, ALKPHOS, BILITOT, PROT, ALBUMIN in the last 168 hours. No results for input(s): LIPASE, AMYLASE in the last 168 hours. No results for input(s): AMMONIA in the last 168 hours. Coagulation Profile: No results for input(s): INR, PROTIME in the last 168 hours. Cardiac Enzymes: No results for input(s): CKTOTAL, CKMB, CKMBINDEX, TROPONINI in the last 168 hours. BNP (last 3 results) No results for input(s): PROBNP in the last 8760 hours. HbA1C: No results for input(s): HGBA1C in the last 72 hours. CBG: No results for input(s): GLUCAP in the last 168 hours. Lipid Profile: No results for input(s): CHOL, HDL, LDLCALC, TRIG, CHOLHDL, LDLDIRECT in the last 72 hours. Thyroid Function Tests: No results for input(s): TSH, T4TOTAL, FREET4, T3FREE, THYROIDAB in the last 72 hours. Anemia Panel: No results for input(s): VITAMINB12, FOLATE, FERRITIN, TIBC, IRON, RETICCTPCT in the last 72 hours. Sepsis Labs: No results for input(s): PROCALCITON, LATICACIDVEN in the last 168 hours.  Recent Results (from the past 240 hour(s))  Resp Panel by RT-PCR (Flu A&B, Covid) Nasopharyngeal Swab      Status: None   Collection Time: 12/21/20  5:35 PM   Specimen: Nasopharyngeal Swab; Nasopharyngeal(NP) swabs in vial transport medium  Result Value Ref Range Status   SARS Coronavirus 2 by RT PCR NEGATIVE NEGATIVE Final    Comment: (NOTE) SARS-CoV-2 target nucleic acids are NOT DETECTED.  The SARS-CoV-2 RNA is generally detectable in upper respiratory specimens during the acute phase of infection. The lowest concentration of SARS-CoV-2 viral copies this assay can detect is 138 copies/mL. A negative result does not preclude SARS-Cov-2 infection and should not be used as the sole basis for treatment or other patient management decisions. A negative result may occur with  improper specimen collection/handling, submission of specimen other than nasopharyngeal swab, presence of viral mutation(s) within the areas targeted by this assay, and inadequate number of viral copies(<138 copies/mL). A negative result must be combined with clinical observations, patient history, and epidemiological information. The  expected result is Negative.  Fact Sheet for Patients:  EntrepreneurPulse.com.au  Fact Sheet for Healthcare Providers:  IncredibleEmployment.be  This test is no t yet approved or cleared by the [place] FDA and  has been authorized for detection and/or diagnosis of SARS-CoV-2 by FDA under an Emergency Use Authorization (EUA). This EUA will remain  in effect (meaning this test can be used) for the duration of the COVID-19 declaration under Section 564(b)(1) of the Act, 21 U.S.C.section 360bbb-3(b)(1), unless the authorization is terminated  or revoked sooner.       Influenza A by PCR NEGATIVE NEGATIVE Final   Influenza B by PCR NEGATIVE NEGATIVE Final    Comment: (NOTE) The Xpert Xpress SARS-CoV-2/FLU/RSV plus assay is intended as an aid in the diagnosis of influenza from Nasopharyngeal swab specimens and should not be used as a sole basis  for treatment. Nasal washings and aspirates are unacceptable for Xpert Xpress SARS-CoV-2/FLU/RSV testing.  Fact Sheet for Patients: EntrepreneurPulse.com.au  Fact Sheet for Healthcare Providers: IncredibleEmployment.be  This test is not yet approved or cleared by the [place] FDA and has been authorized for detection and/or diagnosis of SARS-CoV-2 by FDA under an Emergency Use Authorization (EUA). This EUA will remain in effect (meaning this test can be used) for the duration of the COVID-19 declaration under Section 564(b)(1) of the Act, 21 U.S.C. section 360bbb-3(b)(1), unless the authorization is terminated or revoked.  Performed at [facility], [place], [place] [address]   Resp Panel by RT-PCR (Flu A&B, Covid) Nasopharyngeal Swab     Status: None   Collection Time: 12/27/20 10:45 AM   Specimen: Nasopharyngeal Swab; Nasopharyngeal(NP) swabs in vial transport medium  Result Value Ref Range Status   SARS Coronavirus 2 by RT PCR NEGATIVE NEGATIVE Final    Comment: (NOTE) SARS-CoV-2 target nucleic acids are NOT DETECTED.  The SARS-CoV-2 RNA is generally detectable in upper respiratory specimens during the acute phase of infection. The lowest concentration of SARS-CoV-2 viral copies this assay can detect is 138 copies/mL. A negative result does not preclude SARS-Cov-2 infection and should not be used as the sole basis for treatment or other patient management decisions. A negative result may occur with  improper specimen collection/handling, submission of specimen other than nasopharyngeal swab, presence of viral mutation(s) within the areas targeted by this assay, and inadequate number of viral copies(<138 copies/mL). A negative result must be combined with clinical observations, patient history, and epidemiological information. The expected result is Negative.  Fact Sheet for Patients:   EntrepreneurPulse.com.au  Fact Sheet for Healthcare Providers:  IncredibleEmployment.be  This test is no t yet approved or cleared by the [place] FDA and  has been authorized for detection and/or diagnosis of SARS-CoV-2 by FDA under an Emergency Use Authorization (EUA). This EUA will remain  in effect (meaning this test can be used) for the duration of the COVID-19 declaration under Section 564(b)(1) of the Act, 21 U.S.C.section 360bbb-3(b)(1), unless the authorization is terminated  or revoked sooner.       Influenza A by PCR NEGATIVE NEGATIVE Final   Influenza B by PCR NEGATIVE NEGATIVE Final    Comment: (NOTE) The Xpert Xpress SARS-CoV-2/FLU/RSV plus assay is intended as an aid in the diagnosis of influenza from Nasopharyngeal swab specimens and should not be used as a sole basis for treatment. Nasal washings and aspirates are unacceptable for Xpert Xpress SARS-CoV-2/FLU/RSV testing.  Fact Sheet for Patients: EntrepreneurPulse.com.au  Fact Sheet for Healthcare Providers: IncredibleEmployment.be  This  test is not yet approved or cleared by the [place] and has been authorized for detection and/or diagnosis of SARS-CoV-2 by FDA under an Emergency Use Authorization (EUA). This EUA will remain in effect (meaning this test can be used) for the duration of the COVID-19 declaration under Section 564(b)(1) of the Act, 21 U.S.C. section 360bbb-3(b)(1), unless the authorization is terminated or revoked.  Performed at [facility], [address] [place], [place] [address]          Radiology Studies: No results found.      Scheduled Meds:  Chlorhexidine Gluconate Cloth  6 each Topical Daily   cholecalciferol  1,000 Units Oral Daily   docusate sodium  100 mg Oral BID   enoxaparin (LOVENOX) injection  30 mg Subcutaneous Q24H   feeding supplement  237 mL Oral BID BM    iron polysaccharides  150 mg Oral Daily   metoprolol tartrate  50 mg Oral BID   tamsulosin  0.4 mg Oral Daily   vitamin B-12  1,000 mcg Oral Daily   Continuous Infusions:   LOS: 8 days    Time spent: 28 minutes    Sharen Hones, MD [facility]   To contact the attending provider between 7A-7P or the covering provider during after hours 7P-7A, please log into the web site [email or web address] and access using universal [place] password for that web site. If you do not have the password, please call the hospital operator.  12/29/2020, 9:53 AM

## 2020-12-29 NOTE — Progress Notes (Signed)
   12/29/20 2051  Assess: MEWS Score  Temp 99.1 F (37.3 C)  BP (!) 138/52  Pulse Rate (!) 122  Resp 20  SpO2 97 %  O2 Device Room Air  Assess: MEWS Score  MEWS Temp 0  MEWS Systolic 0  MEWS Pulse 2  MEWS RR 0  MEWS LOC 0  MEWS Score 2  MEWS Score Color Yellow  Assess: if the MEWS score is Yellow or Red  Were vital signs taken at a resting state? Yes  Focused Assessment No change from prior assessment  Does the patient meet 2 or more of the SIRS criteria? No  MEWS guidelines implemented *See Row Information* Yes  Treat  MEWS Interventions Administered scheduled meds/treatments (metoprolol)  Pain Scale Faces  Faces Pain Scale 2  Pain Type Surgical pain  Pain Location Hip  Pain Orientation Left  Pain Intervention(s) Rest  Take Vital Signs  Increase Vital Sign Frequency  Yellow: Q 2hr X 2 then Q 4hr X 2, if remains yellow, continue Q 4hrs  Notify: Charge Nurse/RN  Name of Charge Nurse/RN Notified Olivia, RN  Date Charge Nurse/RN Notified 12/29/20  Time Charge Nurse/RN Notified 2100  Notify: Provider  Provider Name/Title Argie Ramming  Date Provider Notified 12/29/20  Time Provider Notified 2109  Notification Type  (Epic secure chat)  Notification Reason Other (Comment) (MEWS yellow)  Provider response No new orders  Date of Provider Response 12/29/20  Time of Provider Response 2117  Document  Patient Outcome Other (Comment) (Stable, no new changes)  Progress note created (see row info) Yes  Assess: SIRS CRITERIA  SIRS Temperature  0  SIRS Pulse 1  SIRS Respirations  0  SIRS WBC 0  SIRS Score Sum  1

## 2020-12-29 NOTE — Progress Notes (Addendum)
Physical Therapy Treatment Patient Details Name: Aimee Kirk MRN: [id number] DOB: Oct 04, 1922 Today's Date: 12/29/2020    History of Present Illness As per MD H and P: Aimee Kirk is a 87 y.o. female with medical history significant for advanced dementia, chronic diastolic heart failure, moderate mitral regurgitation, who is admitted to [facility] on 12/21/2020 with acute left femoral neck fracture after presenting from ALF to [facility] ED for evaluation of fall.  Per these reports, the patient was bending over to tie her shoe, when she fell forward, landing on her left side, including left hip, as the principal point of contact with the floor. No other complaints. Pt is now S/P L hemiarthroplasty, WBAT to LLE.    PT Comments    Pt in bed.  Anxious asking "help me" which she repeats constantly during session.  She does state she needs to use the bathroom.  She is assisted to sitting at EOB with mod/max a x 1.  Steady in sitting.  Stand pivot to commode at bedside with mod/max a x 1.  Brief wet and she is incontinent of urine during transfer.  She is able to continue voiding on commode.  Urine very dark and foul smelling.  Saved in commode and RN notified. She is able to stand with RW for care and +1 assist.  Transfers to recliner by standing and a second person to remove commode and bring recliner up behind her.  HR 100's today during session.  Unable to progress gait today as standing was poor with walker and she is unable to advance feet.   Follow Up Recommendations  SNF     Equipment Recommendations       Recommendations for Other Services       Precautions / Restrictions Precautions Precautions: Fall;Posterior Hip Precaution Booklet Issued: Yes (comment) Precaution Comments: Abductor pillow used when in bed. Restrictions Weight Bearing Restrictions: Yes RLE Weight Bearing: Weight bearing as tolerated LLE Weight Bearing: Weight bearing as tolerated     Mobility  Bed Mobility Overal bed mobility: Needs Assistance Bed Mobility: Supine to Sit     Supine to sit: Mod assist;Max assist          Transfers Overall transfer level: Needs assistance Equipment used: Rolling walker (2 wheeled);None Transfers: Sit to/from [facility] Sit to Stand: Mod assist Stand pivot transfers: Mod assist;Max assist       General transfer comment: able to stand with RW for pericare but for transfers needed stand pivot  Ambulation/Gait             General Gait Details: uanble to take steps today - trying to sit and not step   Stairs             Wheelchair Mobility    Modified Rankin (Stroke Patients Only)       Balance Overall balance assessment: Needs assistance Sitting-balance support: Single extremity supported;Feet supported Sitting balance-Leahy Scale: Good     Standing balance support: Bilateral upper extremity supported Standing balance-Leahy Scale: Poor                              Cognition Arousal/Alertness: Awake/alert Behavior During Therapy: Anxious Overall Cognitive Status: No family/caregiver present to determine baseline cognitive functioning  General Comments: repeats "help me" almost constantly during session      Exercises      General Comments        Pertinent Vitals/Pain Pain Assessment: Faces Faces Pain Scale: Hurts a little bit Pain Descriptors / Indicators: Grimacing;Guarding Pain Intervention(s): Limited activity within patient's tolerance;Monitored during session;Repositioned    Home Living                      Prior Function            PT Goals (current goals can now be found in the care plan section) Progress towards PT goals: Progressing toward goals    Frequency    7X/week      PT Plan Current plan remains appropriate    Co-evaluation              AM-PAC PT "6 Clicks"  Mobility   Outcome Measure  Help needed turning from your back to your side while in a flat bed without using bedrails?: A Lot Help needed moving from lying on your back to sitting on the side of a flat bed without using bedrails?: A Lot Help needed moving to and from a bed to a chair (including a wheelchair)?: A Lot Help needed standing up from a chair using your arms (e.g., wheelchair or bedside chair)?: A Lot Help needed to walk in hospital room?: Total Help needed climbing 3-5 steps with a railing? : Total 6 Click Score: 10    End of Session   Activity Tolerance: Treatment limited secondary to medical complications (Comment) Patient left: in bed;with call bell/phone within reach;with bed alarm set Nurse Communication: Mobility status;Other (comment) PT Visit Diagnosis: Unsteadiness on feet (R26.81);Other abnormalities of gait and mobility (R26.89);Muscle weakness (generalized) (M62.81);History of falling (Z91.81);Ataxic gait (R26.0);Difficulty in walking, not elsewhere classified (R26.2)     Time: [id number] PT Time Calculation (min) (ACUTE ONLY): 16 min  Charges:  $Therapeutic Activity: 8-22 mins                    Chesley Noon, PTA 12/29/20, 1:12 PM  Chesley Noon 12/29/2020, 1:00 PM

## 2020-12-29 NOTE — Progress Notes (Signed)
[facility]) Hospital Liaison Note  Hospice Medicare revocation completed effective 8.13.22. Family chose to revoke patient's hospice services in order to seek short term rehab in facility. Transfer to facility did not occur on Saturday as initially planned.   [facility] will continue to follow patient for discharge disposition as son has expressed he would like hospice services at discharge in the event patient does not go to short term rehab.  Please do not hesitate to call with any hospice related questions.   Thank you,   Bobbie "Loren Racer, [place], BSN [facility] Liaison [phone]

## 2020-12-29 NOTE — Plan of Care (Signed)
Patient asleep most of the night. No acute distress noted. No new changes in assessment. Bed alarm on.   PLAN OF CARE ONGOING Problem: Education: Goal: Knowledge of General Education information will improve Description: Including pain rating scale, medication(s)/side effects and non-pharmacologic comfort measures Outcome: Progressing   Problem: Health Behavior/Discharge Planning: Goal: Ability to manage health-related needs will improve Outcome: Progressing   Problem: Clinical Measurements: Goal: Ability to maintain clinical measurements within normal limits will improve Outcome: Progressing Goal: Will remain free from infection Outcome: Progressing Goal: Diagnostic test results will improve Outcome: Progressing Goal: Respiratory complications will improve Outcome: Progressing Goal: Cardiovascular complication will be avoided Outcome: Progressing   Problem: Activity: Goal: Risk for activity intolerance will decrease Outcome: Progressing   Problem: Nutrition: Goal: Adequate nutrition will be maintained Outcome: Progressing   Problem: Coping: Goal: Level of anxiety will decrease Outcome: Progressing   Problem: Elimination: Goal: Will not experience complications related to bowel motility Outcome: Progressing Goal: Will not experience complications related to urinary retention Outcome: Progressing   Problem: Pain Managment: Goal: General experience of comfort will improve Outcome: Progressing   Problem: Safety: Goal: Ability to remain free from injury will improve Outcome: Progressing   Problem: Skin Integrity: Goal: Risk for impaired skin integrity will decrease Outcome: Progressing

## 2020-12-30 ENCOUNTER — Inpatient Hospital Stay

## 2020-12-30 DIAGNOSIS — J690 Pneumonitis due to inhalation of food and vomit: Secondary | ICD-10-CM | POA: Diagnosis not present

## 2020-12-30 DIAGNOSIS — S72002A Fracture of unspecified part of neck of left femur, initial encounter for closed fracture: Secondary | ICD-10-CM | POA: Diagnosis not present

## 2020-12-30 DIAGNOSIS — I5032 Chronic diastolic (congestive) heart failure: Secondary | ICD-10-CM | POA: Diagnosis not present

## 2020-12-30 LAB — URINALYSIS, COMPLETE (UACMP) WITH MICROSCOPIC
Bilirubin Urine: NEGATIVE
Glucose, UA: NEGATIVE mg/dL
Ketones, ur: NEGATIVE mg/dL
Nitrite: NEGATIVE
Protein, ur: NEGATIVE mg/dL
Specific Gravity, Urine: 1.015 (ref 1.005–1.030)
Squamous Epithelial / HPF: NONE SEEN (ref 0–5)
WBC, UA: >50 WBC/hpf — ABNORMAL HIGH (ref 0–5)
pH: 5 (ref 5.0–8.0)

## 2020-12-30 MED ORDER — SODIUM CHLORIDE 0.9 % IV SOLN
3.0000 g | Freq: Three times a day (TID) | INTRAVENOUS | Status: DC
  Administered 2020-12-30 – 2020-12-31 (×2): 3 g via INTRAVENOUS
  Filled 2020-12-30 (×6): qty 8

## 2020-12-30 MED ORDER — ACETAMINOPHEN 325 MG PO TABS
650.0000 mg | ORAL_TABLET | Freq: Once | ORAL | Status: AC
  Administered 2020-12-30: 650 mg via ORAL
  Filled 2020-12-30: qty 2

## 2020-12-30 MED ORDER — METOPROLOL TARTRATE 5 MG/5ML IV SOLN
2.5000 mg | Freq: Once | INTRAVENOUS | Status: AC
  Administered 2020-12-30: 2.5 mg via INTRAVENOUS
  Filled 2020-12-30: qty 5

## 2020-12-30 NOTE — Evaluation (Signed)
Clinical/Bedside Swallow Evaluation Patient Details  Name: Aimee Kirk MRN: [id number] Date of Birth: 06/08/1922  Today's Date: 12/30/2020 Time: SLP Start Time (ACUTE ONLY): 1300 SLP Stop Time (ACUTE ONLY): 1350 SLP Time Calculation (min) (ACUTE ONLY): 50 min  Past Medical History:  Past Medical History:  Diagnosis Date   Dementia ([place])    Thyroid disease    Past Surgical History:  Past Surgical History:  Procedure Laterality Date   ABDOMINAL HYSTERECTOMY     HIP ARTHROPLASTY Left 12/22/2020   Procedure: ARTHROPLASTY BIPOLAR HIP (HEMIARTHROPLASTY);  Surgeon: Leim Fabry, MD;  Location: [facility] ORS;  Service: Orthopedics;  Laterality: Left;   HPI:  Per admitting H&P " Aimee Kirk is a 87 y.o. female with medical history significant for advanced dementia, chronic diastolic heart failure, moderate mitral regurgitation, who is admitted to [facility] on 12/21/2020 with acute left femoral neck fracture after presenting from ALF to [facility] ED for evaluation of fall.     In the context of the patient's advanced dementia, the following history is obtained via my discussions with the patient's son (POA), who is present at bedside in addition to my discussions with the EDP and via chart review.     As conveyed to the patient's son by the assisted living facility staff, the patient reportedly experienced a ground-level fall earlier today, which was witnessed, either by the assisted living facility staff or by another resident at the assisted living facility.  Per these reports, the patient was bending over to tie her shoe, when she fell forward, landing on her left side, including left hip, as the principal point of contact with the floor.  It is unclear from his reports that patient hit her head as a component of this fall, but there was no reported associated loss of consciousness.  Upon falling to the floor, the patient reportedly developed immediate left-sided hip pain, and she was  subsequently brought to [facility] ED for further evaluation and management of her left hip pain in the context of the above fall."   Assessment / Plan / Recommendation Clinical Impression  Bedside swallow eval was limited as Pt would only take a few sips and one bite with maxmum encouragement. Pt pleasantly stated she did not want any more and was not hungry. Pt was initially admitted after sustaining a fall and has had a left femoral neck fracture repair. She was ready for transfer to rehab facilitylast week which was delayed. Last evening she presented with fever and subsequent chest xray revealed possible right lower lobe pneumonia (developing or resolving). Pt took two sips of liquid via straw and presented with one throat clear and delayed cough. Even with max encouragement she pleasantly refused further sips of thin liquid. She eventually took one sip of nectar thick and one bite of puree without difficulty. No solids tested. Given the limited amount of PO's Pt is taking, will recommend continuing with current pureed diet with thin liquids without adding further restrictions. ST to follow up in am to further assess with thin liquid verses nectar thick liquids and alter diet if needed. Meds to be given crushed in applesauce. SLP Visit Diagnosis: Dysphagia, oropharyngeal phase (R13.12)    Aspiration Risk  Moderate aspiration risk    Diet Recommendation Dysphagia 1 (Puree)   Medication Administration: Crushed with puree Supervision: Staff to assist with self feeding Compensations: Minimize environmental distractions;Slow rate;Small sips/bites;Other (Comment) (Offer food and liquid throughout the day) Postural Changes: Seated upright at 90 degrees;Remain  upright for at least 30 minutes after po intake    Other  Recommendations Oral Care Recommendations: Oral care BID   Follow up Recommendations Skilled Nursing facility      Frequency and Duration min 2x/week          Prognosis Prognosis for  Safe Diet Advancement: Guarded Barriers to Reach Goals: Cognitive deficits      Swallow Study   General Date of Onset: 12/21/20 HPI: Per admitting H&P " Aimee Kirk is a 87 y.o. female with medical history significant for advanced dementia, chronic diastolic heart failure, moderate mitral regurgitation, who is admitted to [facility] on 12/21/2020 with acute left femoral neck fracture after presenting from ALF to [facility] ED for evaluation of fall.     In the context of the patient's advanced dementia, the following history is obtained via my discussions with the patient's son (POA), who is present at bedside in addition to my discussions with the EDP and via chart review.     As conveyed to the patient's son by the assisted living facility staff, the patient reportedly experienced a ground-level fall earlier today, which was witnessed, either by the assisted living facility staff or by another resident at the assisted living facility.  Per these reports, the patient was bending over to tie her shoe, when she fell forward, landing on her left side, including left hip, as the principal point of contact with the floor.  It is unclear from his reports that patient hit her head as a component of this fall, but there was no reported associated loss of consciousness.  Upon falling to the floor, the patient reportedly developed immediate left-sided hip pain, and she was subsequently brought to [facility] ED for further evaluation and management of her left hip pain in the context of the above fall." Type of Study: Bedside Swallow Evaluation Diet Prior to this Study: Dysphagia 1 (puree) Temperature Spikes Noted: Yes Respiratory Status: Room air History of Recent Intubation: No Behavior/Cognition: Alert;Cooperative;Pleasant mood;Confused;Requires cueing Oral Cavity Assessment: Dried secretions Oral Cavity - Dentition: Poor condition Vision: Impaired for self-feeding Self-Feeding Abilities:  Needs assist;Needs set up Patient Positioning: Upright in chair Baseline Vocal Quality: Low vocal intensity    Oral/Motor/Sensory Function     Ice Chips Ice chips: Within functional limits Presentation: Spoon   Thin Liquid Thin Liquid: Impaired (Took 2 sips) Presentation: Straw Pharyngeal  Phase Impairments: Throat Clearing - Delayed    Nectar Thick Nectar Thick Liquid: Within functional limits Presentation: Cup   Honey Thick Honey Thick Liquid: Not tested   Puree Puree: Within functional limits Presentation: Spoon (Only took one small bite)   Solid     Solid: Not tested      Lucila Maine 12/30/2020,2:08 PM

## 2020-12-30 NOTE — Consult Note (Signed)
Pharmacy Antibiotic Note  Aimee Kirk is a 87 y.o. female w/ h/o adv'd dementia, diaCHF, moderate MR admitted on 12/21/2020 with  acute Lt femoral neck fxr and hospitalization c/b UTI and urinary retention (no resolved). Pt transfer to SNF rejected d/t pt Covid non-Vax status on Friday. New fever 8/15-16 with c/f aspiration pneumonia.  Pharmacy has been consulted for Unasyn dosing.  Plan: Borderline for adjustment per renal fxn (~26m/min), but at BL Scr. 97% ORA, febrile to 100.7F. 8/16 CXR pending. Initiate Unasyn 3g q8h  Height: '5\' 2"'$  (157.5 cm) Weight: 53.4 kg (117 lb 11.6 oz) IBW/kg (Calculated) : 50.1  Temp (24hrs), Avg:99.8 F (37.7 C), Min:97.9 F (36.6 C), Max:100.9 F (38.3 C)  Recent Labs  Lab 12/24/20 0434 12/25/20 0449 12/26/20 0526 12/27/20 0450 12/28/20 0751 12/29/20 0602  WBC 17.2* 15.3* 10.5 9.6 11.1* 13.9*  CREATININE 0.86 0.86  --   --   --  0.63    Estimated Creatinine Clearance: 32.5 mL/min (by C-G formula based on SCr of 0.63 mg/dL).    Allergies  Allergen Reactions   Remdesivir     Pt son states he absolutely does not want his mother to receive this medication     Antimicrobials this admission: Unasyn 8/16 >>  Fosfomycin x1 (8/10)  Dose adjustments this admission: CTM renal fxn and adjust PRN  Microbiology results: 8/07 & 8/13 Flu/Covid: negative x2   Thank you for allowing pharmacy to be a part of this patient's care.  BShanon BrowBeers 12/30/2020 12:14 PM

## 2020-12-30 NOTE — Progress Notes (Addendum)
PROGRESS NOTE    Aimee Kirk  [id number] DOB: 03/30/1923 DOA: 12/21/2020 PCP: Orvis Brill, [facility]   Chief complaint.  New fever. Brief Narrative:  Aimee Kirk is a 87 y.o. female with medical history significant for advanced dementia, chronic diastolic heart failure, moderate mitral regurgitation, who is admitted to [facility] on 12/21/2020 with acute left femoral neck fracture after presenting from ALF to [facility] ED for evaluation of fall.  Images study showed left hip fracture.  Patient had surgery performed on 8/8.  Patient hospitalization was complicated by UTI and urinary retention, which are all resolved. Patient was scheduled to be transferred to nursing home last Friday, SNF refused due to patient has not had COVID-vaccine. Restart insurance certification on Monday. Patient is started fever last night, chest x-ray showed possible right lower lobe aspiration pneumonia.  Unasyn started.  Assessment & Plan:   Principal Problem:   Closed left hip fracture [facility]) Active Problems:   Fall   Protein calorie malnutrition (HCC)   Leukocytosis   Chronic diastolic CHF (congestive heart failure) (HCC)   Essential hypertension   Protein-calorie malnutrition, severe   Hyponatremia   Failure to thrive in adult   Anorexia  #1.  New fever. Likely right lower lobe aspiration pneumonia. Dysphagia. Patient developed a high fever last night, patient had significant dysphagia.  Repeat chest x-ray showed right lower lobe new infiltrates.  Started Unasyn.  Failure to thrive. Anorexia. Sinus tachycardia. Was previously on hospice.  Hospice signed off after patient had a surgery. Patient has a poor appetite, poor p.o. intake.  He received intermittent IV fluids.  She is fed by the nurse. I discussed with the son, patient can still be put back on hospice if condition deteriorates.  Urinary tract infection. Urinary retention secondary to urinary tract  infection. Constipation. Patient developed urinary retention again, Foley anchorded, may have to keep at discharge and follow with urology.   Acute blood loss anemia Iron deficient anemia B12 deficient anemia Continue iron and B12 supplements.  She has received B12 shots x3 and IV iron.  Left femoral neck fracture. Mechanical fall. Status post surgery Patient is overall doing well.   DVT prophylaxis: lovenox Code Status: dnr Family Communication: son is updated Disposition Plan:      Status is: Inpatient   Remains inpatient appropriate because:IV treatments appropriate due to intensity of illness or inability to take PO and Inpatient level of care appropriate due to severity of illness   Dispo: The patient is from: Home              Anticipated d/c is to: SNF              Patient currently is not medically stable to d/c due to new fever and antibiotics use.  Unsafe discharge.              Difficult to place patient No Patient nursing home with denied accepting patient due to patient was not vaccinated on Friday.  Now they are saying that patient need recertification from insurance company.        I/O last 3 completed shifts: In: 766 [I.V.:766] Out: -  No intake/output data recorded.     Consultants:  Orthopedics  Procedures: Hip  Antimicrobials:  Unasyn  Subjective: Patient  Fever above 100.9 last night, she also has dysphagia.  She does not feel short of breath, she has no hypoxia. She is confused at baseline, she has no agitation. No cough, no  chest pain. No dysuria hematuria.  No additional urinary retention.  Objective: Vitals:   12/30/20 0051 12/30/20 0304 12/30/20 0631 12/30/20 0742  BP: (!) 160/74 (!) 112/54  (!) 144/57  Pulse: (!) 120 97  (!) 105  Resp: '20 19  18  '$ Temp: (!) 100.9 F (38.3 C) (!) 100.9 F (38.3 C) 99.8 F (37.7 C) 97.9 F (36.6 C)  TempSrc: Axillary Axillary Axillary   SpO2: 97% 97%  97%  Weight:      Height:       No  intake or output data in the 24 hours ending 12/30/20 1203 Filed Weights   12/21/20 1600 12/22/20 0500  Weight: 45.4 kg 53.4 kg    Examination:  General exam: Appears calm and comfortable  Respiratory system: Clear to auscultation. Respiratory effort normal. Cardiovascular system: S1 & S2 heard, RRR. No JVD, murmurs, rubs, gallops or clicks. No pedal edema. Gastrointestinal system: Abdomen is nondistended, soft and nontender. No organomegaly or masses felt. Normal bowel sounds heard. Central nervous system: Alert and oriented x1. No focal neurological deficits. Extremities: Symmetric 5 x 5 power. Skin: No rashes, lesions or ulcers     Data Reviewed: I have personally reviewed following labs and imaging studies  CBC: Recent Labs  Lab 12/25/20 0449 12/26/20 0526 12/27/20 0450 12/28/20 0751 12/29/20 0602  WBC 15.3* 10.5 9.6 11.1* 13.9*  NEUTROABS  --  8.4* 7.3 8.3* 10.9*  HGB 8.2* 7.8* 7.7* 8.2* 8.4*  HCT 24.9* 24.0* 23.9* 24.9* 26.5*  MCV 94.3 96.8 97.6 96.1 97.1  PLT 300 332 375 467* [id number]*   Basic Metabolic Panel: Recent Labs  Lab 12/24/20 0434 12/25/20 0449 12/29/20 0602  NA 137 139 143  K 4.6 4.7 4.4  CL 109 110 109  CO2 '22 24 26  '$ GLUCOSE 114* 111* 110*  BUN 29* 38* 26*  CREATININE 0.86 0.86 0.63  CALCIUM 8.2* 8.1* 8.4*  MG  --   --  2.2   GFR: Estimated Creatinine Clearance: 32.5 mL/min (by C-G formula based on SCr of 0.63 mg/dL). Liver Function Tests: No results for input(s): AST, ALT, ALKPHOS, BILITOT, PROT, ALBUMIN in the last 168 hours. No results for input(s): LIPASE, AMYLASE in the last 168 hours. No results for input(s): AMMONIA in the last 168 hours. Coagulation Profile: No results for input(s): INR, PROTIME in the last 168 hours. Cardiac Enzymes: No results for input(s): CKTOTAL, CKMB, CKMBINDEX, TROPONINI in the last 168 hours. BNP (last 3 results) No results for input(s): PROBNP in the last 8760 hours. HbA1C: No results for input(s): HGBA1C  in the last 72 hours. CBG: No results for input(s): GLUCAP in the last 168 hours. Lipid Profile: No results for input(s): CHOL, HDL, LDLCALC, TRIG, CHOLHDL, LDLDIRECT in the last 72 hours. Thyroid Function Tests: No results for input(s): TSH, T4TOTAL, FREET4, T3FREE, THYROIDAB in the last 72 hours. Anemia Panel: No results for input(s): VITAMINB12, FOLATE, FERRITIN, TIBC, IRON, RETICCTPCT in the last 72 hours. Sepsis Labs: No results for input(s): PROCALCITON, LATICACIDVEN in the last 168 hours.  Recent Results (from the past 240 hour(s))  Resp Panel by RT-PCR (Flu A&B, Covid) Nasopharyngeal Swab     Status: None   Collection Time: 12/21/20  5:35 PM   Specimen: Nasopharyngeal Swab; Nasopharyngeal(NP) swabs in vial transport medium  Result Value Ref Range Status   SARS Coronavirus 2 by RT PCR NEGATIVE NEGATIVE Final    Comment: (NOTE) SARS-CoV-2 target nucleic acids are NOT DETECTED.  The SARS-CoV-2 RNA is generally detectable in  upper respiratory specimens during the acute phase of infection. The lowest concentration of SARS-CoV-2 viral copies this assay can detect is 138 copies/mL. A negative result does not preclude SARS-Cov-2 infection and should not be used as the sole basis for treatment or other patient management decisions. A negative result may occur with  improper specimen collection/handling, submission of specimen other than nasopharyngeal swab, presence of viral mutation(s) within the areas targeted by this assay, and inadequate number of viral copies(<138 copies/mL). A negative result must be combined with clinical observations, patient history, and epidemiological information. The expected result is Negative.  Fact Sheet for Patients:  EntrepreneurPulse.com.au  Fact Sheet for Healthcare Providers:  IncredibleEmployment.be  This test is no t yet approved or cleared by the [place] FDA and  has been authorized for detection  and/or diagnosis of SARS-CoV-2 by FDA under an Emergency Use Authorization (EUA). This EUA will remain  in effect (meaning this test can be used) for the duration of the COVID-19 declaration under Section 564(b)(1) of the Act, 21 U.S.C.section 360bbb-3(b)(1), unless the authorization is terminated  or revoked sooner.       Influenza A by PCR NEGATIVE NEGATIVE Final   Influenza B by PCR NEGATIVE NEGATIVE Final    Comment: (NOTE) The Xpert Xpress SARS-CoV-2/FLU/RSV plus assay is intended as an aid in the diagnosis of influenza from Nasopharyngeal swab specimens and should not be used as a sole basis for treatment. Nasal washings and aspirates are unacceptable for Xpert Xpress SARS-CoV-2/FLU/RSV testing.  Fact Sheet for Patients: EntrepreneurPulse.com.au  Fact Sheet for Healthcare Providers: IncredibleEmployment.be  This test is not yet approved or cleared by the [place] FDA and has been authorized for detection and/or diagnosis of SARS-CoV-2 by FDA under an Emergency Use Authorization (EUA). This EUA will remain in effect (meaning this test can be used) for the duration of the COVID-19 declaration under Section 564(b)(1) of the Act, 21 U.S.C. section 360bbb-3(b)(1), unless the authorization is terminated or revoked.  Performed at [facility], [place], [place] [address]   Resp Panel by RT-PCR (Flu A&B, Covid) Nasopharyngeal Swab     Status: None   Collection Time: 12/27/20 10:45 AM   Specimen: Nasopharyngeal Swab; Nasopharyngeal(NP) swabs in vial transport medium  Result Value Ref Range Status   SARS Coronavirus 2 by RT PCR NEGATIVE NEGATIVE Final    Comment: (NOTE) SARS-CoV-2 target nucleic acids are NOT DETECTED.  The SARS-CoV-2 RNA is generally detectable in upper respiratory specimens during the acute phase of infection. The lowest concentration of SARS-CoV-2 viral copies this assay can detect is 138  copies/mL. A negative result does not preclude SARS-Cov-2 infection and should not be used as the sole basis for treatment or other patient management decisions. A negative result may occur with  improper specimen collection/handling, submission of specimen other than nasopharyngeal swab, presence of viral mutation(s) within the areas targeted by this assay, and inadequate number of viral copies(<138 copies/mL). A negative result must be combined with clinical observations, patient history, and epidemiological information. The expected result is Negative.  Fact Sheet for Patients:  EntrepreneurPulse.com.au  Fact Sheet for Healthcare Providers:  IncredibleEmployment.be  This test is no t yet approved or cleared by the [place] FDA and  has been authorized for detection and/or diagnosis of SARS-CoV-2 by FDA under an Emergency Use Authorization (EUA). This EUA will remain  in effect (meaning this test can be used) for the duration of the COVID-19 declaration under Section 564(b)(1) of the  Act, 21 U.S.C.section 360bbb-3(b)(1), unless the authorization is terminated  or revoked sooner.       Influenza A by PCR NEGATIVE NEGATIVE Final   Influenza B by PCR NEGATIVE NEGATIVE Final    Comment: (NOTE) The Xpert Xpress SARS-CoV-2/FLU/RSV plus assay is intended as an aid in the diagnosis of influenza from Nasopharyngeal swab specimens and should not be used as a sole basis for treatment. Nasal washings and aspirates are unacceptable for Xpert Xpress SARS-CoV-2/FLU/RSV testing.  Fact Sheet for Patients: EntrepreneurPulse.com.au  Fact Sheet for Healthcare Providers: IncredibleEmployment.be  This test is not yet approved or cleared by the [place] FDA and has been authorized for detection and/or diagnosis of SARS-CoV-2 by FDA under an Emergency Use Authorization (EUA). This EUA will remain in effect (meaning  this test can be used) for the duration of the COVID-19 declaration under Section 564(b)(1) of the Act, 21 U.S.C. section 360bbb-3(b)(1), unless the authorization is terminated or revoked.  Performed at [facility], [address], [place], [place] [address]          Radiology Studies: No results found.      Scheduled Meds:  Chlorhexidine Gluconate Cloth  6 each Topical Daily   cholecalciferol  1,000 Units Oral Daily   docusate sodium  100 mg Oral BID   enoxaparin (LOVENOX) injection  30 mg Subcutaneous Q24H   iron polysaccharides  150 mg Oral Daily   metoprolol tartrate  50 mg Oral BID   tamsulosin  0.4 mg Oral Daily   vitamin B-12  1,000 mcg Oral Daily   Continuous Infusions:   LOS: 9 days    Time spent: 32 minutes, more than 50% of the time spent on direct patient care.    Sharen Hones, MD [facility]   To contact the attending provider between 7A-7P or the covering provider during after hours 7P-7A, please log into the web site [email or web address] and access using universal [place] password for that web site. If you do not have the password, please call the hospital operator.  12/30/2020, 12:03 PM

## 2020-12-30 NOTE — Progress Notes (Addendum)
Physical Therapy Treatment Patient Details Name: Aimee Kirk MRN: [id number] DOB: 05-29-1922 Today's Date: 12/30/2020    History of Present Illness As per MD H and P: Aimee Kirk is a 87 y.o. female with medical history significant for advanced dementia, chronic diastolic heart failure, moderate mitral regurgitation, who is admitted to [facility] on 12/21/2020 with acute left femoral neck fracture after presenting from ALF to [facility] ED for evaluation of fall.  Per these reports, the patient was bending over to tie her shoe, when she fell forward, landing on her left side, including left hip, as the principal point of contact with the floor. No other complaints. Pt is now S/P L hemiarthroplasty, WBAT to LLE.    PT Comments    "Help me"  repeatedly.  Participated in exercises as described below. To EOB with mod a x 1.  Resists less today and seems to assist some.  Steady in sitting once upright.  She stands with max a x 1 and transfers to commode at bedside.  She is inc of urine while transferring and again x 2 while standing for care.  Does void some in commode.  Urine remains dark.  She stands with difficulty with RW for care and is unable to transfer safely with walker so standing is assisted and commode removed and recliner brought behind her.  Seemed calmer in recliner.  HR 100's during activity.  Son in at end of session and updated on care and transition process.   Follow Up Recommendations  SNF     Equipment Recommendations       Recommendations for Other Services       Precautions / Restrictions Precautions Precautions: Fall;Posterior Hip Precaution Booklet Issued: Yes (comment) Precaution Comments: Abductor pillow used when in bed. Restrictions Weight Bearing Restrictions: Yes RLE Weight Bearing: Weight bearing as tolerated LLE Weight Bearing: Weight bearing as tolerated    Mobility  Bed Mobility Overal bed mobility: Needs Assistance Bed  Mobility: Supine to Sit Rolling: Mod assist;Min assist              Transfers Overall transfer level: Needs assistance Equipment used: Rolling walker (2 wheeled);None   Sit to Stand: Mod assist Stand pivot transfers: Mod assist;Max assist       General transfer comment: able to stand with RW for pericare but for transfers needed stand pivot  Ambulation/Gait         Gait velocity: decreased   General Gait Details: uanble to take steps today - trying to sit and not step   Stairs             Wheelchair Mobility    Modified Rankin (Stroke Patients Only)       Balance Overall balance assessment: Needs assistance Sitting-balance support: Single extremity supported;Feet supported Sitting balance-Leahy Scale: Good     Standing balance support: Bilateral upper extremity supported Standing balance-Leahy Scale: Poor                              Cognition Arousal/Alertness: Awake/alert Behavior During Therapy: Anxious Overall Cognitive Status: No family/caregiver present to determine baseline cognitive functioning                                 General Comments: repeats "help me" almost constantly during session, seems calmer in chair      Exercises  General Comments        Pertinent Vitals/Pain Pain Assessment: Faces Faces Pain Scale: Hurts a little bit Pain Location: L hip Pain Descriptors / Indicators: Grimacing;Guarding Pain Intervention(s): Limited activity within patient's tolerance;Monitored during session;Repositioned    Home Living                      Prior Function            PT Goals (current goals can now be found in the care plan section) Progress towards PT goals: Progressing toward goals    Frequency    7X/week      PT Plan Current plan remains appropriate    Co-evaluation              AM-PAC PT "6 Clicks" Mobility   Outcome Measure  Help needed turning from your  back to your side while in a flat bed without using bedrails?: A Lot Help needed moving from lying on your back to sitting on the side of a flat bed without using bedrails?: A Lot Help needed moving to and from a bed to a chair (including a wheelchair)?: Total Help needed standing up from a chair using your arms (e.g., wheelchair or bedside chair)?: Total Help needed to walk in hospital room?: Total Help needed climbing 3-5 steps with a railing? : Total 6 Click Score: 8    End of Session   Activity Tolerance: Treatment limited secondary to medical complications (Comment) Patient left: in bed;with call bell/phone within reach;with bed alarm set Nurse Communication: Mobility status;Other (comment) PT Visit Diagnosis: Unsteadiness on feet (R26.81);Other abnormalities of gait and mobility (R26.89);Muscle weakness (generalized) (M62.81);History of falling (Z91.81);Ataxic gait (R26.0);Difficulty in walking, not elsewhere classified (R26.2)     Time: [id number] PT Time Calculation (min) (ACUTE ONLY): 26 min  Charges:  $Therapeutic Activity: 23-37 mins                    Chesley Noon, PTA 12/30/20, 10:42 AM , 10:39 AM

## 2020-12-30 NOTE — Progress Notes (Signed)
   12/30/20 0051  Vitals  Temp (!) 100.9 F (38.3 C)  Temp Source Axillary  BP (!) 160/74  MAP (mmHg) 96  BP Location Right Arm  BP Method Automatic  Patient Position (if appropriate) Lying  Pulse Rate (!) 120  Pulse Rate Source Monitor  Resp 20    Dr. Sidney Ace aware. Tylenol and IV metoprolol ordered.

## 2020-12-30 NOTE — TOC Progression Note (Addendum)
Transition of Care [facility]) - Progression Note    Patient Details  Name: Aimee Kirk MRN: [id number] Date of Birth: [birth year]  Transition of Care [facility]) CM/SW Contact  Candie Chroman, LCSW Phone Number: 12/30/2020, 10:17 AM  Clinical Narrative:   Insurance authorization still pending.  3:00 pm: Insurance authorization approved. Transport unable to take her to [place] today. Sent information to [employer]. Requested 12:00 if possible. Son is aware and agreeable. Asked MD to order COVID test.  Expected Discharge Plan and Services           Expected Discharge Date: 12/27/20                                     Social Determinants of Health (SDOH) Interventions    Readmission Risk Interventions No flowsheet data found.

## 2020-12-30 NOTE — Progress Notes (Signed)
Occupational Therapy Treatment Patient Details Name: Aimee Kirk MRN: [id number] DOB: [birth year] Today's Date: 12/30/2020    History of present illness As per MD H and P: Aimee Kirk is a 87 y.o. female with medical history significant for advanced dementia, chronic diastolic heart failure, moderate mitral regurgitation, who is admitted to [facility] on 12/21/2020 with acute left femoral neck fracture after presenting from ALF to [facility] ED for evaluation of fall.  Per these reports, the patient was bending over to tie her shoe, when she fell forward, landing on her left side, including left hip, as the principal point of contact with the floor. No other complaints. Pt is now S/P L hemiarthroplasty, WBAT to LLE.   OT comments  Upon entering the room, pt seated in recliner chair and has been up for several hours. OT asking pt about returning to the bed and pt is agreeable and pats mattress with her hand. Pt stands from recliner chair with max A and then urinates on floor. Pt returning to sit to clean for and then attempting again but pt is unable to sequence or initiate any steps towards bed and calls out ," help me" throughout session. Pt having to be transferred back to bed with total A lateral scoot transfer. OT providing assistance to change chux and total A for peri hygiene. Abductor pillow placed and bed alarm activated.    Follow Up Recommendations  SNF;Supervision/Assistance - 24 hour    Equipment Recommendations  Other (comment) (defer to next venue of care)       Precautions / Restrictions Precautions Precautions: Fall;Posterior Hip Precaution Booklet Issued: Yes (comment) Precaution Comments: Abductor pillow used when in bed. Restrictions Weight Bearing Restrictions: Yes RLE Weight Bearing: Weight bearing as tolerated LLE Weight Bearing: Weight bearing as tolerated       Mobility Bed Mobility Overal bed mobility: Needs Assistance Bed Mobility:  Rolling;Sit to Supine Rolling: Mod assist;Max assist     Sit to supine: Max assist        Transfers Overall transfer level: Needs assistance Equipment used: Rolling walker (2 wheeled);None Transfers: Sit to/from Stand Sit to Stand: Max assist Stand pivot transfers: Mod assist;Max assist       General transfer comment: Pt unable to sequence side steps or forwards steps towards bed. Pt stands and urinates on floor.    Balance Overall balance assessment: Needs assistance Sitting-balance support: Single extremity supported;Feet supported Sitting balance-Leahy Scale: Good Sitting balance - Comments: steady static sitting   Standing balance support: Bilateral upper extremity supported Standing balance-Leahy Scale: Poor                             ADL either performed or assessed with clinical judgement     Vision Patient Visual Report: No change from baseline            Cognition Arousal/Alertness: Awake/alert Behavior During Therapy: Anxious Overall Cognitive Status: History of cognitive impairments - at baseline                                 General Comments: repeats "help me" almost constantly during session. Unable to sequence taking any steps.                   Pertinent Vitals/ Pain       Pain Assessment: Faces Faces Pain Scale: Hurts even  more Pain Location: L hip Pain Descriptors / Indicators: Grimacing;Guarding Pain Intervention(s): Limited activity within patient's tolerance;Monitored during session;Repositioned   Frequency  Min 2X/week        Progress Toward Goals  OT Goals(current goals can now be found in the care plan section)  Progress towards OT goals: Progressing toward goals  Acute Rehab OT Goals Patient Stated Goal: none stated OT Goal Formulation: Patient unable to participate in goal setting Time For Goal Achievement: 01/06/21 Potential to Achieve Goals: [place] Discharge plan remains  appropriate;Frequency remains appropriate       AM-PAC OT "6 Clicks" Daily Activity     Outcome Measure   Help from another person eating meals?: A Little Help from another person taking care of personal grooming?: A Lot Help from another person toileting, which includes using toliet, bedpan, or urinal?: Total Help from another person bathing (including washing, rinsing, drying)?: Total Help from another person to put on and taking off regular upper body clothing?: A Lot Help from another person to put on and taking off regular lower body clothing?: Total 6 Click Score: 10       Activity Tolerance Patient limited by pain   Patient Left in bed;with call bell/phone within reach;with bed alarm set;Other (comment);with family/visitor present   Nurse Communication Mobility status        Time: [phone] OT Time Calculation (min): 23 min  Charges: OT General Charges $OT Visit: 1 Visit OT Treatments $Self Care/Home Management : 8-22 mins $Therapeutic Activity: 8-22 mins  Darleen Crocker, MS, OTR/L , CBIS ascom [phone]  12/30/20, 2:32 PM

## 2020-12-31 DIAGNOSIS — S72002D Fracture of unspecified part of neck of left femur, subsequent encounter for closed fracture with routine healing: Secondary | ICD-10-CM | POA: Diagnosis not present

## 2020-12-31 DIAGNOSIS — R279 Unspecified lack of coordination: Secondary | ICD-10-CM | POA: Diagnosis not present

## 2020-12-31 DIAGNOSIS — R262 Difficulty in walking, not elsewhere classified: Secondary | ICD-10-CM | POA: Diagnosis not present

## 2020-12-31 DIAGNOSIS — Z4789 Encounter for other orthopedic aftercare: Secondary | ICD-10-CM | POA: Diagnosis not present

## 2020-12-31 DIAGNOSIS — I10 Essential (primary) hypertension: Secondary | ICD-10-CM | POA: Diagnosis not present

## 2020-12-31 DIAGNOSIS — N319 Neuromuscular dysfunction of bladder, unspecified: Secondary | ICD-10-CM | POA: Diagnosis not present

## 2020-12-31 DIAGNOSIS — R41841 Cognitive communication deficit: Secondary | ICD-10-CM | POA: Diagnosis not present

## 2020-12-31 DIAGNOSIS — R627 Adult failure to thrive: Secondary | ICD-10-CM | POA: Diagnosis not present

## 2020-12-31 DIAGNOSIS — R69 Illness, unspecified: Secondary | ICD-10-CM | POA: Diagnosis not present

## 2020-12-31 DIAGNOSIS — R5381 Other malaise: Secondary | ICD-10-CM | POA: Diagnosis not present

## 2020-12-31 DIAGNOSIS — D649 Anemia, unspecified: Secondary | ICD-10-CM | POA: Diagnosis not present

## 2020-12-31 DIAGNOSIS — I5023 Acute on chronic systolic (congestive) heart failure: Secondary | ICD-10-CM | POA: Diagnosis not present

## 2020-12-31 DIAGNOSIS — Z96649 Presence of unspecified artificial hip joint: Secondary | ICD-10-CM | POA: Diagnosis not present

## 2020-12-31 DIAGNOSIS — M6281 Muscle weakness (generalized): Secondary | ICD-10-CM | POA: Diagnosis not present

## 2020-12-31 LAB — SARS CORONAVIRUS 2 (TAT 6-24 HRS): SARS Coronavirus 2: NEGATIVE

## 2020-12-31 MED ORDER — TAMSULOSIN HCL 0.4 MG PO CAPS: 0.4000 mg | ORAL_CAPSULE | Freq: Every day | ORAL | Status: AC

## 2020-12-31 MED ORDER — AMOXICILLIN-POT CLAVULANATE 875-125 MG PO TABS
1.0000 | ORAL_TABLET | Freq: Two times a day (BID) | ORAL | Status: DC
  Administered 2020-12-31: 1 via ORAL

## 2020-12-31 MED ORDER — AMOXICILLIN-POT CLAVULANATE 875-125 MG PO TABS: 1.0000 | ORAL_TABLET | Freq: Two times a day (BID) | ORAL | Status: AC

## 2020-12-31 NOTE — Progress Notes (Signed)
Pt discharged via transport, son called to notify.   BP (!) 155/55 (BP Location: Right Arm)   Pulse (!) 103   Temp 99.1 F (37.3 C) (Oral)   Resp 18   Ht '5\' 2"'$  (1.575 m)   Wt 53.4 kg   SpO2 98%   BMI 21.53 kg/m    12/31/20 1200  AVS Discharge Documentation  AVS Discharge Instructions Including Medications Placed in discharge packet for receiving facility  Name of Person Receiving AVS Discharge Instructions Including Medications Leonidas Romberg, LPN  Name of Clinician That Reviewed AVS Discharge Instructions Including Medications Orland Penman, RN

## 2020-12-31 NOTE — Plan of Care (Signed)
Patient appears comfortable in bed.  Two bowel movements overnight. Bed in lowest position. Bed alarm on.    PLAN OF CARE ONGOING Problem: Education: Goal: Knowledge of General Education information will improve Description: Including pain rating scale, medication(s)/side effects and non-pharmacologic comfort measures Outcome: Progressing   Problem: Health Behavior/Discharge Planning: Goal: Ability to manage health-related needs will improve Outcome: Progressing   Problem: Clinical Measurements: Goal: Ability to maintain clinical measurements within normal limits will improve Outcome: Progressing Goal: Will remain free from infection Outcome: Progressing Goal: Diagnostic test results will improve Outcome: Progressing Goal: Respiratory complications will improve Outcome: Progressing Goal: Cardiovascular complication will be avoided Outcome: Progressing   Problem: Activity: Goal: Risk for activity intolerance will decrease Outcome: Progressing   Problem: Nutrition: Goal: Adequate nutrition will be maintained Outcome: Progressing   Problem: Coping: Goal: Level of anxiety will decrease Outcome: Progressing   Problem: Elimination: Goal: Will not experience complications related to bowel motility Outcome: Progressing Goal: Will not experience complications related to urinary retention Outcome: Progressing   Problem: Pain Managment: Goal: General experience of comfort will improve Outcome: Progressing   Problem: Safety: Goal: Ability to remain free from injury will improve Outcome: Progressing   Problem: Skin Integrity: Goal: Risk for impaired skin integrity will decrease Outcome: Progressing   Problem: Urinary Elimination: Goal: Signs and symptoms of infection will decrease Outcome: Progressing

## 2020-12-31 NOTE — Plan of Care (Signed)
  Problem: Education: Goal: Knowledge of General Education information will improve Description: Including pain rating scale, medication(s)/side effects and non-pharmacologic comfort measures Outcome: Not Progressing   Problem: Health Behavior/Discharge Planning: Goal: Ability to manage health-related needs will improve Outcome: Not Progressing   Problem: Clinical Measurements: Goal: Ability to maintain clinical measurements within normal limits will improve Outcome: Not Progressing Goal: Will remain free from infection Outcome: Not Progressing Goal: Diagnostic test results will improve Outcome: Not Progressing Goal: Respiratory complications will improve Outcome: Not Progressing Goal: Cardiovascular complication will be avoided Outcome: Not Progressing   Problem: Coping: Goal: Level of anxiety will decrease Outcome: Not Progressing   Problem: Elimination: Goal: Will not experience complications related to bowel motility Outcome: Not Progressing Goal: Will not experience complications related to urinary retention Outcome: Not Progressing   Problem: Safety: Goal: Ability to remain free from injury will improve Outcome: Not Progressing

## 2020-12-31 NOTE — Plan of Care (Signed)
  Problem: Education: Goal: Knowledge of General Education information will improve Description: Including pain rating scale, medication(s)/side effects and non-pharmacologic comfort measures 12/31/2020 1214 by Jules Schick, RN Outcome: Completed/Met 12/31/2020 0811 by Jules Schick, RN Outcome: Not Progressing   Problem: Health Behavior/Discharge Planning: Goal: Ability to manage health-related needs will improve 12/31/2020 1214 by Jules Schick, RN Outcome: Completed/Met 12/31/2020 0811 by Jules Schick, RN Outcome: Not Progressing   Problem: Clinical Measurements: Goal: Ability to maintain clinical measurements within normal limits will improve 12/31/2020 1214 by Jules Schick, RN Outcome: Completed/Met 12/31/2020 0811 by Jules Schick, RN Outcome: Not Progressing Goal: Will remain free from infection 12/31/2020 1214 by Jules Schick, RN Outcome: Completed/Met 12/31/2020 0811 by Jules Schick, RN Outcome: Not Progressing Goal: Diagnostic test results will improve 12/31/2020 1214 by Jules Schick, RN Outcome: Completed/Met 12/31/2020 0811 by Jules Schick, RN Outcome: Not Progressing Goal: Respiratory complications will improve 12/31/2020 1214 by Jules Schick, RN Outcome: Completed/Met 12/31/2020 0811 by Jules Schick, RN Outcome: Not Progressing Goal: Cardiovascular complication will be avoided 12/31/2020 1214 by Jules Schick, RN Outcome: Completed/Met 12/31/2020 0811 by Jules Schick, RN Outcome: Not Progressing   Problem: Activity: Goal: Risk for activity intolerance will decrease 12/31/2020 1214 by Jules Schick, RN Outcome: Completed/Met 12/31/2020 0811 by Jules Schick, RN Outcome: Not Progressing   Problem: Nutrition: Goal: Adequate nutrition will be maintained 12/31/2020 1214 by Jules Schick, RN Outcome: Completed/Met 12/31/2020 0811 by Jules Schick, RN Outcome: Not Progressing   Problem:  Coping: Goal: Level of anxiety will decrease 12/31/2020 1214 by Jules Schick, RN Outcome: Completed/Met 12/31/2020 0811 by Jules Schick, RN Outcome: Not Progressing   Problem: Elimination: Goal: Will not experience complications related to bowel motility 12/31/2020 1214 by Jules Schick, RN Outcome: Completed/Met 12/31/2020 0811 by Jules Schick, RN Outcome: Not Progressing Goal: Will not experience complications related to urinary retention 12/31/2020 1214 by Jules Schick, RN Outcome: Completed/Met 12/31/2020 0811 by Jules Schick, RN Outcome: Not Progressing   Problem: Pain Managment: Goal: General experience of comfort will improve 12/31/2020 1214 by Jules Schick, RN Outcome: Completed/Met 12/31/2020 0811 by Jules Schick, RN Outcome: Not Progressing   Problem: Safety: Goal: Ability to remain free from injury will improve 12/31/2020 1214 by Jules Schick, RN Outcome: Completed/Met 12/31/2020 0811 by Jules Schick, RN Outcome: Not Progressing   Problem: Skin Integrity: Goal: Risk for impaired skin integrity will decrease 12/31/2020 1214 by Jules Schick, RN Outcome: Completed/Met 12/31/2020 0811 by Jules Schick, RN Outcome: Not Progressing   Problem: Urinary Elimination: Goal: Signs and symptoms of infection will decrease 12/31/2020 1214 by Jules Schick, RN Outcome: Completed/Met 12/31/2020 0811 by Jules Schick, RN Outcome: Not Progressing

## 2020-12-31 NOTE — TOC Progression Note (Signed)
Transition of Care [facility]) - Progression Note    Patient Details  Name: Aimee Kirk MRN: [id number] Date of Birth: [birth year]  Transition of Care [facility]) CM/SW Graham, RN Phone Number: 12/31/2020, 11:09 AM  Clinical Narrative:   The patient is going to go to [facility], room [phone], I have called the Lattie Haw and let him know, bedside nurse to call once Transport arrives to pick up, Bedside nurse to call report to [phone]          Expected Discharge Plan and Services           Expected Discharge Date: 12/31/20                                     Social Determinants of Health (SDOH) Interventions    Readmission Risk Interventions No flowsheet data found.

## 2020-12-31 NOTE — Care Management Important Message (Signed)
Important Message  Patient Details  Name: Aimee Kirk MRN: [id number] Date of Birth: [birth year]   Medicare Important Message Given:  Yes  I talked with Aimee Kirk by phone [phone]) and reviewed the Important Message from Medicare with him. He is in agreement with the discharge for today.  I asked if he would like a copy of the form and he replied no as he already has a copy. I thanked him for his time.  Juliann Pulse A Onika Gudiel 12/31/2020, 11:27 AM

## 2021-01-01 DIAGNOSIS — I10 Essential (primary) hypertension: Secondary | ICD-10-CM | POA: Diagnosis not present

## 2021-01-02 DIAGNOSIS — R69 Illness, unspecified: Secondary | ICD-10-CM | POA: Diagnosis not present

## 2021-01-02 DIAGNOSIS — Z96649 Presence of unspecified artificial hip joint: Secondary | ICD-10-CM | POA: Diagnosis not present

## 2021-01-02 DIAGNOSIS — D649 Anemia, unspecified: Secondary | ICD-10-CM | POA: Diagnosis not present

## 2021-01-02 DIAGNOSIS — I5023 Acute on chronic systolic (congestive) heart failure: Secondary | ICD-10-CM | POA: Diagnosis not present

## 2021-01-02 DIAGNOSIS — N319 Neuromuscular dysfunction of bladder, unspecified: Secondary | ICD-10-CM | POA: Diagnosis not present

## 2021-01-02 DIAGNOSIS — S72002D Fracture of unspecified part of neck of left femur, subsequent encounter for closed fracture with routine healing: Secondary | ICD-10-CM | POA: Diagnosis not present

## 2021-01-03 DIAGNOSIS — D649 Anemia, unspecified: Secondary | ICD-10-CM | POA: Diagnosis not present

## 2021-01-05 DIAGNOSIS — R404 Transient alteration of awareness: Secondary | ICD-10-CM | POA: Diagnosis not present

## 2021-01-05 DIAGNOSIS — I509 Heart failure, unspecified: Secondary | ICD-10-CM | POA: Diagnosis not present

## 2021-01-05 DIAGNOSIS — Z66 Do not resuscitate: Secondary | ICD-10-CM | POA: Diagnosis not present

## 2021-01-05 DIAGNOSIS — D62 Acute posthemorrhagic anemia: Secondary | ICD-10-CM | POA: Diagnosis not present

## 2021-01-05 DIAGNOSIS — E869 Volume depletion, unspecified: Secondary | ICD-10-CM | POA: Diagnosis not present

## 2021-01-05 DIAGNOSIS — K921 Melena: Secondary | ICD-10-CM | POA: Diagnosis not present

## 2021-01-05 DIAGNOSIS — R58 Hemorrhage, not elsewhere classified: Secondary | ICD-10-CM | POA: Diagnosis not present

## 2021-01-05 DIAGNOSIS — K922 Gastrointestinal hemorrhage, unspecified: Secondary | ICD-10-CM | POA: Diagnosis not present

## 2021-01-05 DIAGNOSIS — Z7901 Long term (current) use of anticoagulants: Secondary | ICD-10-CM | POA: Diagnosis not present

## 2021-01-05 DIAGNOSIS — E860 Dehydration: Secondary | ICD-10-CM | POA: Diagnosis not present

## 2021-01-05 DIAGNOSIS — R197 Diarrhea, unspecified: Secondary | ICD-10-CM | POA: Diagnosis not present

## 2021-01-05 DIAGNOSIS — N390 Urinary tract infection, site not specified: Secondary | ICD-10-CM | POA: Diagnosis not present

## 2021-01-05 DIAGNOSIS — Z20822 Contact with and (suspected) exposure to covid-19: Secondary | ICD-10-CM | POA: Diagnosis not present

## 2021-01-05 DIAGNOSIS — R69 Illness, unspecified: Secondary | ICD-10-CM | POA: Diagnosis not present

## 2021-01-05 DIAGNOSIS — K625 Hemorrhage of anus and rectum: Secondary | ICD-10-CM | POA: Diagnosis not present

## 2021-01-05 DIAGNOSIS — R000 Tachycardia, unspecified: Secondary | ICD-10-CM | POA: Diagnosis not present

## 2021-01-05 DIAGNOSIS — D649 Anemia, unspecified: Secondary | ICD-10-CM | POA: Diagnosis not present

## 2021-01-06 DIAGNOSIS — Z66 Do not resuscitate: Secondary | ICD-10-CM | POA: Diagnosis not present

## 2021-01-06 DIAGNOSIS — K921 Melena: Secondary | ICD-10-CM | POA: Diagnosis not present

## 2021-01-06 DIAGNOSIS — D649 Anemia, unspecified: Secondary | ICD-10-CM | POA: Diagnosis not present

## 2021-01-07 DIAGNOSIS — Z66 Do not resuscitate: Secondary | ICD-10-CM | POA: Diagnosis not present

## 2021-01-07 DIAGNOSIS — K921 Melena: Secondary | ICD-10-CM | POA: Diagnosis not present

## 2021-01-07 DIAGNOSIS — D649 Anemia, unspecified: Secondary | ICD-10-CM | POA: Diagnosis not present

## 2021-01-08 DIAGNOSIS — Z66 Do not resuscitate: Secondary | ICD-10-CM | POA: Diagnosis not present

## 2021-01-08 DIAGNOSIS — K921 Melena: Secondary | ICD-10-CM | POA: Diagnosis not present

## 2021-01-08 DIAGNOSIS — D649 Anemia, unspecified: Secondary | ICD-10-CM | POA: Diagnosis not present

## 2021-01-08 DIAGNOSIS — N390 Urinary tract infection, site not specified: Secondary | ICD-10-CM | POA: Diagnosis not present

## 2021-01-08 DIAGNOSIS — R000 Tachycardia, unspecified: Secondary | ICD-10-CM | POA: Diagnosis not present

## 2021-01-08 DIAGNOSIS — E869 Volume depletion, unspecified: Secondary | ICD-10-CM | POA: Diagnosis not present

## 2021-01-09 DIAGNOSIS — Z66 Do not resuscitate: Secondary | ICD-10-CM | POA: Diagnosis not present

## 2021-01-09 DIAGNOSIS — M6281 Muscle weakness (generalized): Secondary | ICD-10-CM | POA: Diagnosis not present

## 2021-01-09 DIAGNOSIS — K5903 Drug induced constipation: Secondary | ICD-10-CM | POA: Diagnosis not present

## 2021-01-09 DIAGNOSIS — R69 Illness, unspecified: Secondary | ICD-10-CM | POA: Diagnosis not present

## 2021-01-09 DIAGNOSIS — D649 Anemia, unspecified: Secondary | ICD-10-CM | POA: Diagnosis not present

## 2021-01-09 DIAGNOSIS — M1712 Unilateral primary osteoarthritis, left knee: Secondary | ICD-10-CM | POA: Diagnosis not present

## 2021-01-09 DIAGNOSIS — M79605 Pain in left leg: Secondary | ICD-10-CM | POA: Diagnosis not present

## 2021-01-09 DIAGNOSIS — K922 Gastrointestinal hemorrhage, unspecified: Secondary | ICD-10-CM | POA: Diagnosis not present

## 2021-01-09 DIAGNOSIS — E871 Hypo-osmolality and hyponatremia: Secondary | ICD-10-CM | POA: Diagnosis not present

## 2021-01-09 DIAGNOSIS — N390 Urinary tract infection, site not specified: Secondary | ICD-10-CM | POA: Diagnosis not present

## 2021-01-09 DIAGNOSIS — R1314 Dysphagia, pharyngoesophageal phase: Secondary | ICD-10-CM | POA: Diagnosis not present

## 2021-01-09 DIAGNOSIS — Z96642 Presence of left artificial hip joint: Secondary | ICD-10-CM | POA: Diagnosis not present

## 2021-01-09 DIAGNOSIS — Z7401 Bed confinement status: Secondary | ICD-10-CM | POA: Diagnosis not present

## 2021-01-09 DIAGNOSIS — Z96649 Presence of unspecified artificial hip joint: Secondary | ICD-10-CM | POA: Diagnosis not present

## 2021-01-09 DIAGNOSIS — R627 Adult failure to thrive: Secondary | ICD-10-CM | POA: Diagnosis not present

## 2021-01-09 DIAGNOSIS — R41841 Cognitive communication deficit: Secondary | ICD-10-CM | POA: Diagnosis not present

## 2021-01-09 DIAGNOSIS — E43 Unspecified severe protein-calorie malnutrition: Secondary | ICD-10-CM | POA: Diagnosis not present

## 2021-01-09 DIAGNOSIS — Z4789 Encounter for other orthopedic aftercare: Secondary | ICD-10-CM | POA: Diagnosis not present

## 2021-01-09 DIAGNOSIS — I5023 Acute on chronic systolic (congestive) heart failure: Secondary | ICD-10-CM | POA: Diagnosis not present

## 2021-01-09 DIAGNOSIS — E46 Unspecified protein-calorie malnutrition: Secondary | ICD-10-CM | POA: Diagnosis not present

## 2021-01-09 DIAGNOSIS — R262 Difficulty in walking, not elsewhere classified: Secondary | ICD-10-CM | POA: Diagnosis not present

## 2021-01-09 DIAGNOSIS — M25552 Pain in left hip: Secondary | ICD-10-CM | POA: Diagnosis not present

## 2021-01-09 DIAGNOSIS — I959 Hypotension, unspecified: Secondary | ICD-10-CM | POA: Diagnosis not present

## 2021-01-09 DIAGNOSIS — N319 Neuromuscular dysfunction of bladder, unspecified: Secondary | ICD-10-CM | POA: Diagnosis not present

## 2021-01-09 DIAGNOSIS — D72829 Elevated white blood cell count, unspecified: Secondary | ICD-10-CM | POA: Diagnosis not present

## 2021-01-09 DIAGNOSIS — J690 Pneumonitis due to inhalation of food and vomit: Secondary | ICD-10-CM | POA: Diagnosis not present

## 2021-01-09 DIAGNOSIS — R630 Anorexia: Secondary | ICD-10-CM | POA: Diagnosis not present

## 2021-01-09 DIAGNOSIS — F0390 Unspecified dementia without behavioral disturbance: Secondary | ICD-10-CM | POA: Diagnosis not present

## 2021-01-09 DIAGNOSIS — I5032 Chronic diastolic (congestive) heart failure: Secondary | ICD-10-CM | POA: Diagnosis not present

## 2021-01-09 DIAGNOSIS — S72002D Fracture of unspecified part of neck of left femur, subsequent encounter for closed fracture with routine healing: Secondary | ICD-10-CM | POA: Diagnosis not present

## 2021-01-09 DIAGNOSIS — R1312 Dysphagia, oropharyngeal phase: Secondary | ICD-10-CM | POA: Diagnosis not present

## 2021-01-09 DIAGNOSIS — T402X5A Adverse effect of other opioids, initial encounter: Secondary | ICD-10-CM | POA: Diagnosis not present

## 2021-01-09 DIAGNOSIS — R000 Tachycardia, unspecified: Secondary | ICD-10-CM | POA: Diagnosis not present

## 2021-01-13 DIAGNOSIS — N390 Urinary tract infection, site not specified: Secondary | ICD-10-CM | POA: Diagnosis not present

## 2021-01-13 DIAGNOSIS — R69 Illness, unspecified: Secondary | ICD-10-CM | POA: Diagnosis not present

## 2021-01-13 DIAGNOSIS — D649 Anemia, unspecified: Secondary | ICD-10-CM | POA: Diagnosis not present

## 2021-01-13 DIAGNOSIS — I5032 Chronic diastolic (congestive) heart failure: Secondary | ICD-10-CM | POA: Diagnosis not present

## 2021-01-13 DIAGNOSIS — E46 Unspecified protein-calorie malnutrition: Secondary | ICD-10-CM | POA: Diagnosis not present

## 2021-01-13 DIAGNOSIS — K922 Gastrointestinal hemorrhage, unspecified: Secondary | ICD-10-CM | POA: Diagnosis not present

## 2021-01-13 DIAGNOSIS — S72002D Fracture of unspecified part of neck of left femur, subsequent encounter for closed fracture with routine healing: Secondary | ICD-10-CM | POA: Diagnosis not present

## 2021-01-14 DIAGNOSIS — R69 Illness, unspecified: Secondary | ICD-10-CM | POA: Diagnosis not present

## 2021-01-14 DIAGNOSIS — K5903 Drug induced constipation: Secondary | ICD-10-CM | POA: Diagnosis not present

## 2021-01-14 DIAGNOSIS — T402X5A Adverse effect of other opioids, initial encounter: Secondary | ICD-10-CM | POA: Diagnosis not present

## 2021-01-16 DIAGNOSIS — K922 Gastrointestinal hemorrhage, unspecified: Secondary | ICD-10-CM | POA: Diagnosis not present

## 2021-01-16 DIAGNOSIS — E46 Unspecified protein-calorie malnutrition: Secondary | ICD-10-CM | POA: Diagnosis not present

## 2021-01-16 DIAGNOSIS — Z96649 Presence of unspecified artificial hip joint: Secondary | ICD-10-CM | POA: Diagnosis not present

## 2021-01-16 DIAGNOSIS — D649 Anemia, unspecified: Secondary | ICD-10-CM | POA: Diagnosis not present

## 2021-01-16 DIAGNOSIS — N319 Neuromuscular dysfunction of bladder, unspecified: Secondary | ICD-10-CM | POA: Diagnosis not present

## 2021-01-16 DIAGNOSIS — S72002D Fracture of unspecified part of neck of left femur, subsequent encounter for closed fracture with routine healing: Secondary | ICD-10-CM | POA: Diagnosis not present

## 2021-01-16 DIAGNOSIS — R69 Illness, unspecified: Secondary | ICD-10-CM | POA: Diagnosis not present

## 2021-01-16 DIAGNOSIS — I5032 Chronic diastolic (congestive) heart failure: Secondary | ICD-10-CM | POA: Diagnosis not present

## 2021-01-22 DIAGNOSIS — M79605 Pain in left leg: Secondary | ICD-10-CM | POA: Diagnosis not present

## 2021-01-23 DIAGNOSIS — D649 Anemia, unspecified: Secondary | ICD-10-CM | POA: Diagnosis not present

## 2021-01-23 DIAGNOSIS — N319 Neuromuscular dysfunction of bladder, unspecified: Secondary | ICD-10-CM | POA: Diagnosis not present

## 2021-01-23 DIAGNOSIS — E46 Unspecified protein-calorie malnutrition: Secondary | ICD-10-CM | POA: Diagnosis not present

## 2021-01-23 DIAGNOSIS — N390 Urinary tract infection, site not specified: Secondary | ICD-10-CM | POA: Diagnosis not present

## 2021-01-23 DIAGNOSIS — Z96649 Presence of unspecified artificial hip joint: Secondary | ICD-10-CM | POA: Diagnosis not present

## 2021-01-23 DIAGNOSIS — K922 Gastrointestinal hemorrhage, unspecified: Secondary | ICD-10-CM | POA: Diagnosis not present

## 2021-01-23 DIAGNOSIS — R69 Illness, unspecified: Secondary | ICD-10-CM | POA: Diagnosis not present

## 2021-01-23 DIAGNOSIS — S72002D Fracture of unspecified part of neck of left femur, subsequent encounter for closed fracture with routine healing: Secondary | ICD-10-CM | POA: Diagnosis not present

## 2021-01-26 DIAGNOSIS — R627 Adult failure to thrive: Secondary | ICD-10-CM | POA: Diagnosis not present

## 2021-01-26 DIAGNOSIS — D649 Anemia, unspecified: Secondary | ICD-10-CM | POA: Diagnosis not present

## 2021-01-26 DIAGNOSIS — N319 Neuromuscular dysfunction of bladder, unspecified: Secondary | ICD-10-CM | POA: Diagnosis not present

## 2021-01-26 DIAGNOSIS — G309 Alzheimer's disease, unspecified: Secondary | ICD-10-CM | POA: Diagnosis not present

## 2021-01-26 DIAGNOSIS — I10 Essential (primary) hypertension: Secondary | ICD-10-CM | POA: Diagnosis not present

## 2021-01-26 DIAGNOSIS — Z515 Encounter for palliative care: Secondary | ICD-10-CM | POA: Diagnosis not present

## 2021-02-14 DEATH — deceased

## 2021-02-16 ENCOUNTER — Encounter (HOSPITAL_COMMUNITY): Payer: Self-pay | Admitting: Radiology

## 2022-08-13 IMAGING — DX DG KNEE 1-2V*L*
2 series · 2 of 2 positions shown · non-contrast
Comparison: None.

CLINICAL DATA: Status post fall.

EXAM:
LEFT KNEE - 1-2 VIEW

[knee ap]
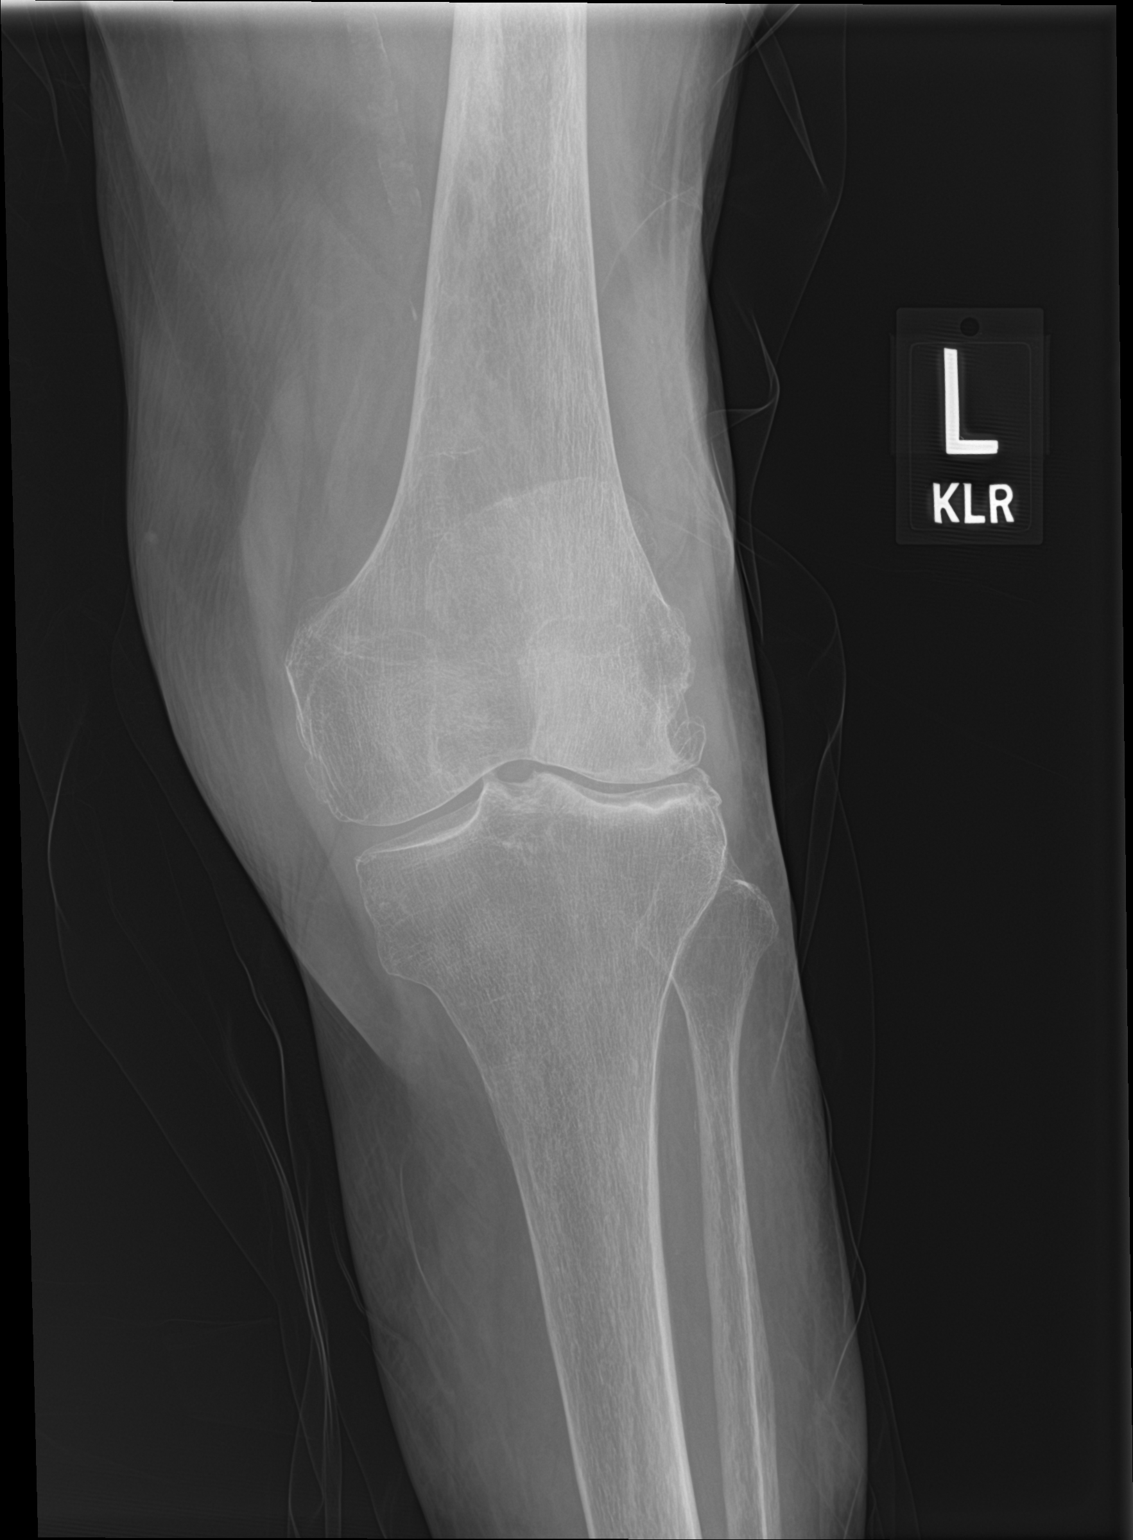

[knee lat]
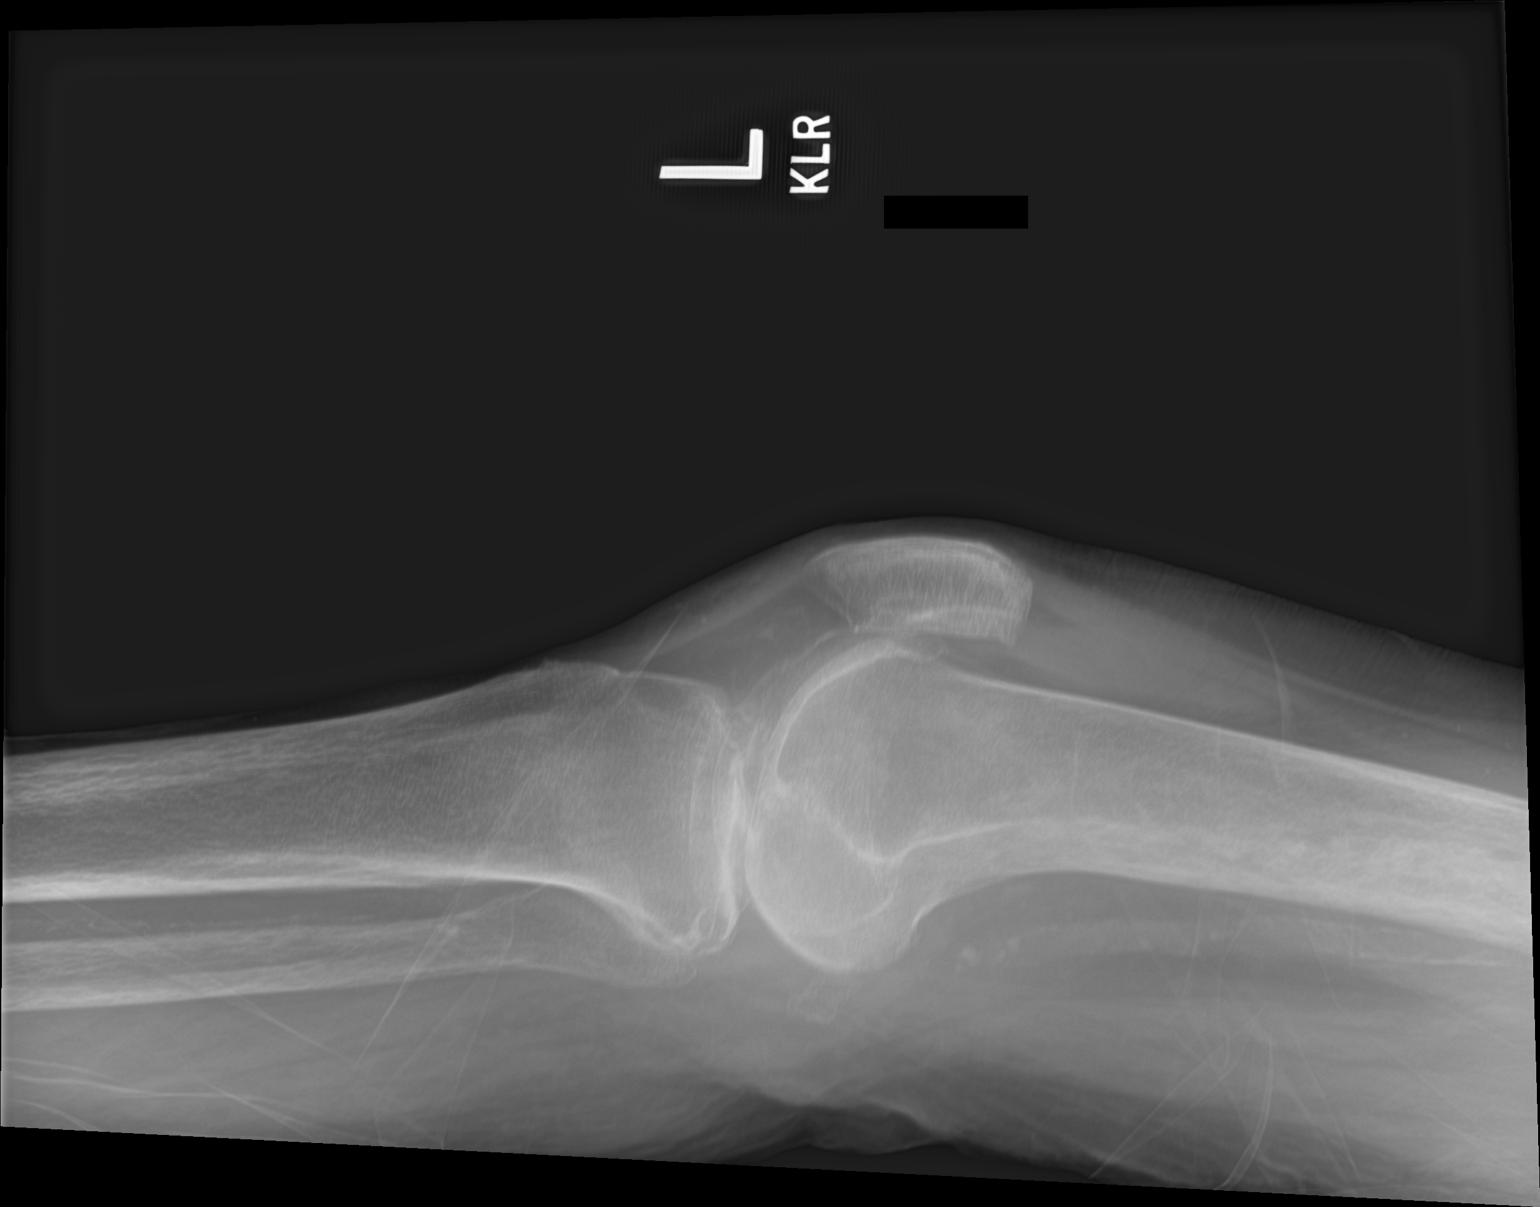

[2 of 2 positions shown; findings below may reference images not displayed]

FINDINGS: No evidence of an acute fracture or dislocation. Moderate to marked
severity medial and lateral tibiofemoral compartment space narrowing
is seen. There is a moderate sized joint effusion.
IMPRESSION: 1. No evidence of an acute fracture.
2. Moderate to marked severity degenerative changes with a moderate
sized joint effusion.

## 2022-08-13 IMAGING — DX DG PELVIS 1-2V
1 series · 1 of 1 positions shown · non-contrast
Comparison: None.

CLINICAL DATA: Recent fall with pelvic pain, initial encounter

EXAM:
PELVIS - 1 VIEW

[pelvis ap]
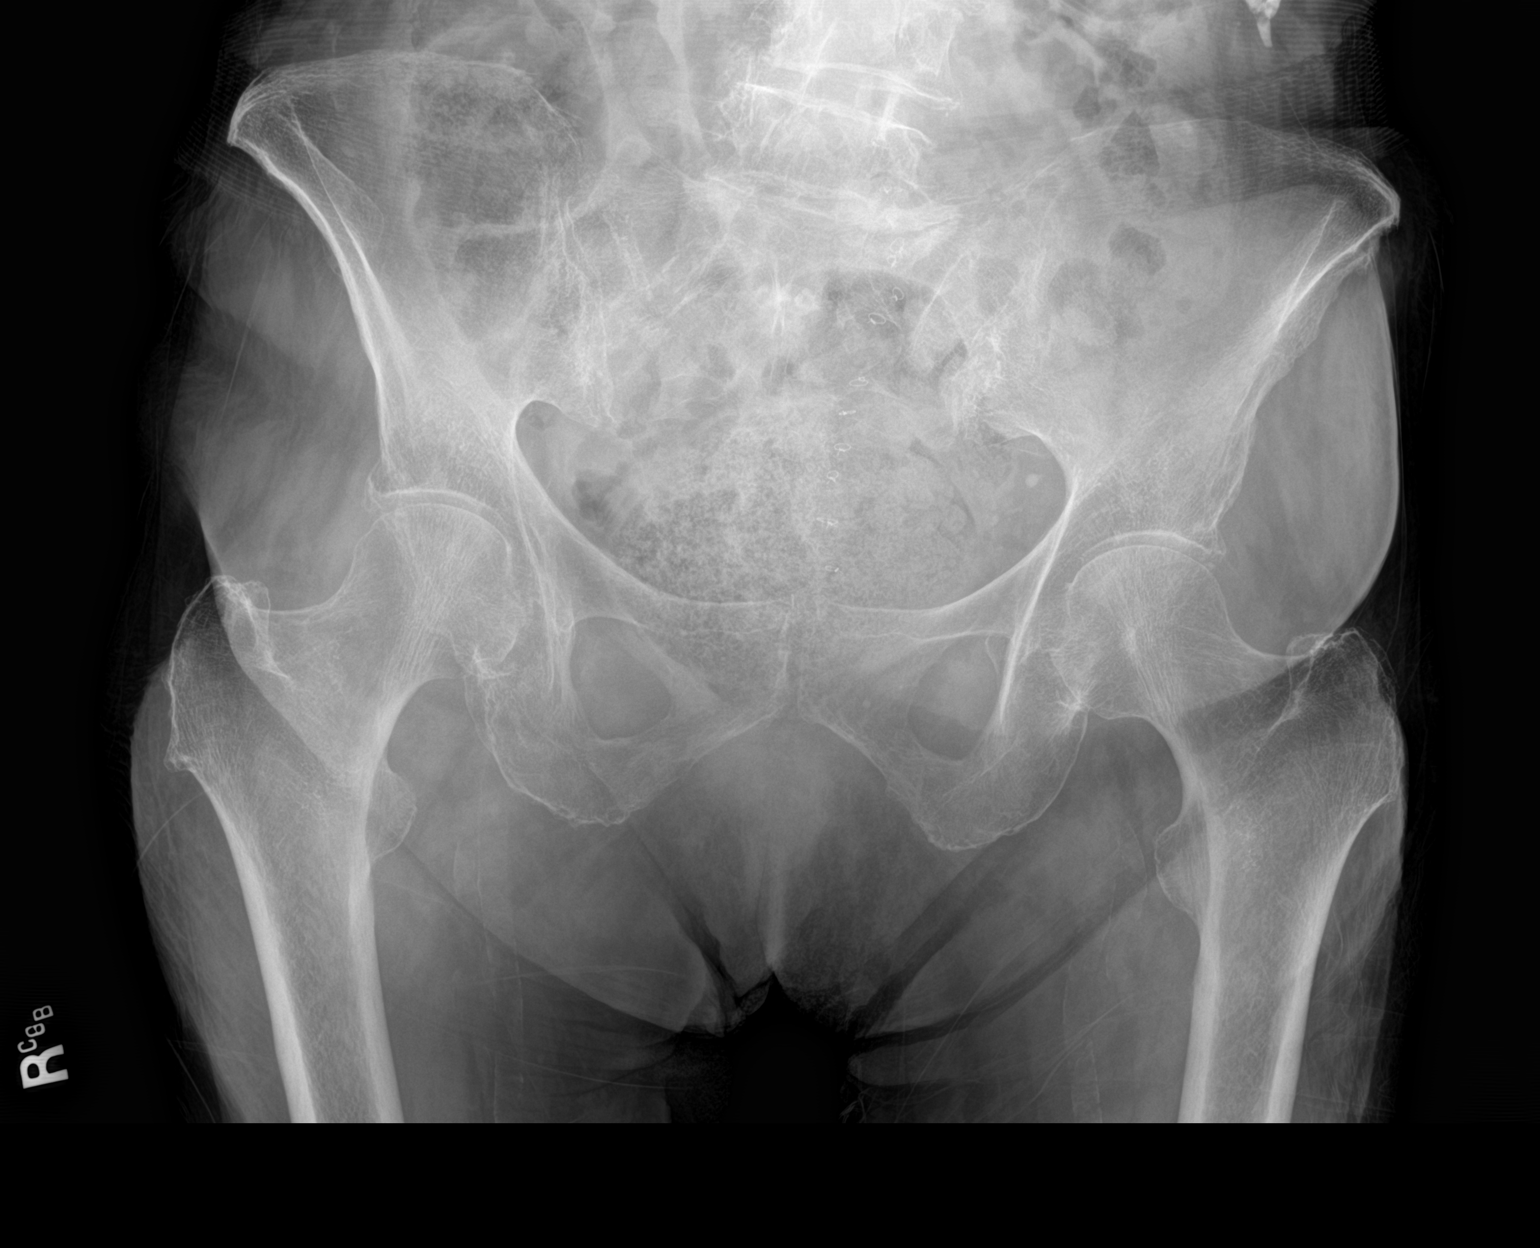

[1 of 1 positions shown; findings below may reference images not displayed]

FINDINGS: Pelvic ring is intact. Degenerative changes of the lumbar spine are
noted. Mild degenerative changes of the hip joints are seen. No
acute fracture is noted.
IMPRESSION: No acute abnormality seen.

## 2023-05-31 IMAGING — CR DG CHEST 1V
1 series · 1 of 1 positions shown · non-contrast
Comparison: 03/05/2020

CLINICAL DATA: Pain after falling.

EXAM:
CHEST  1 VIEW

[dg chest 1 view]
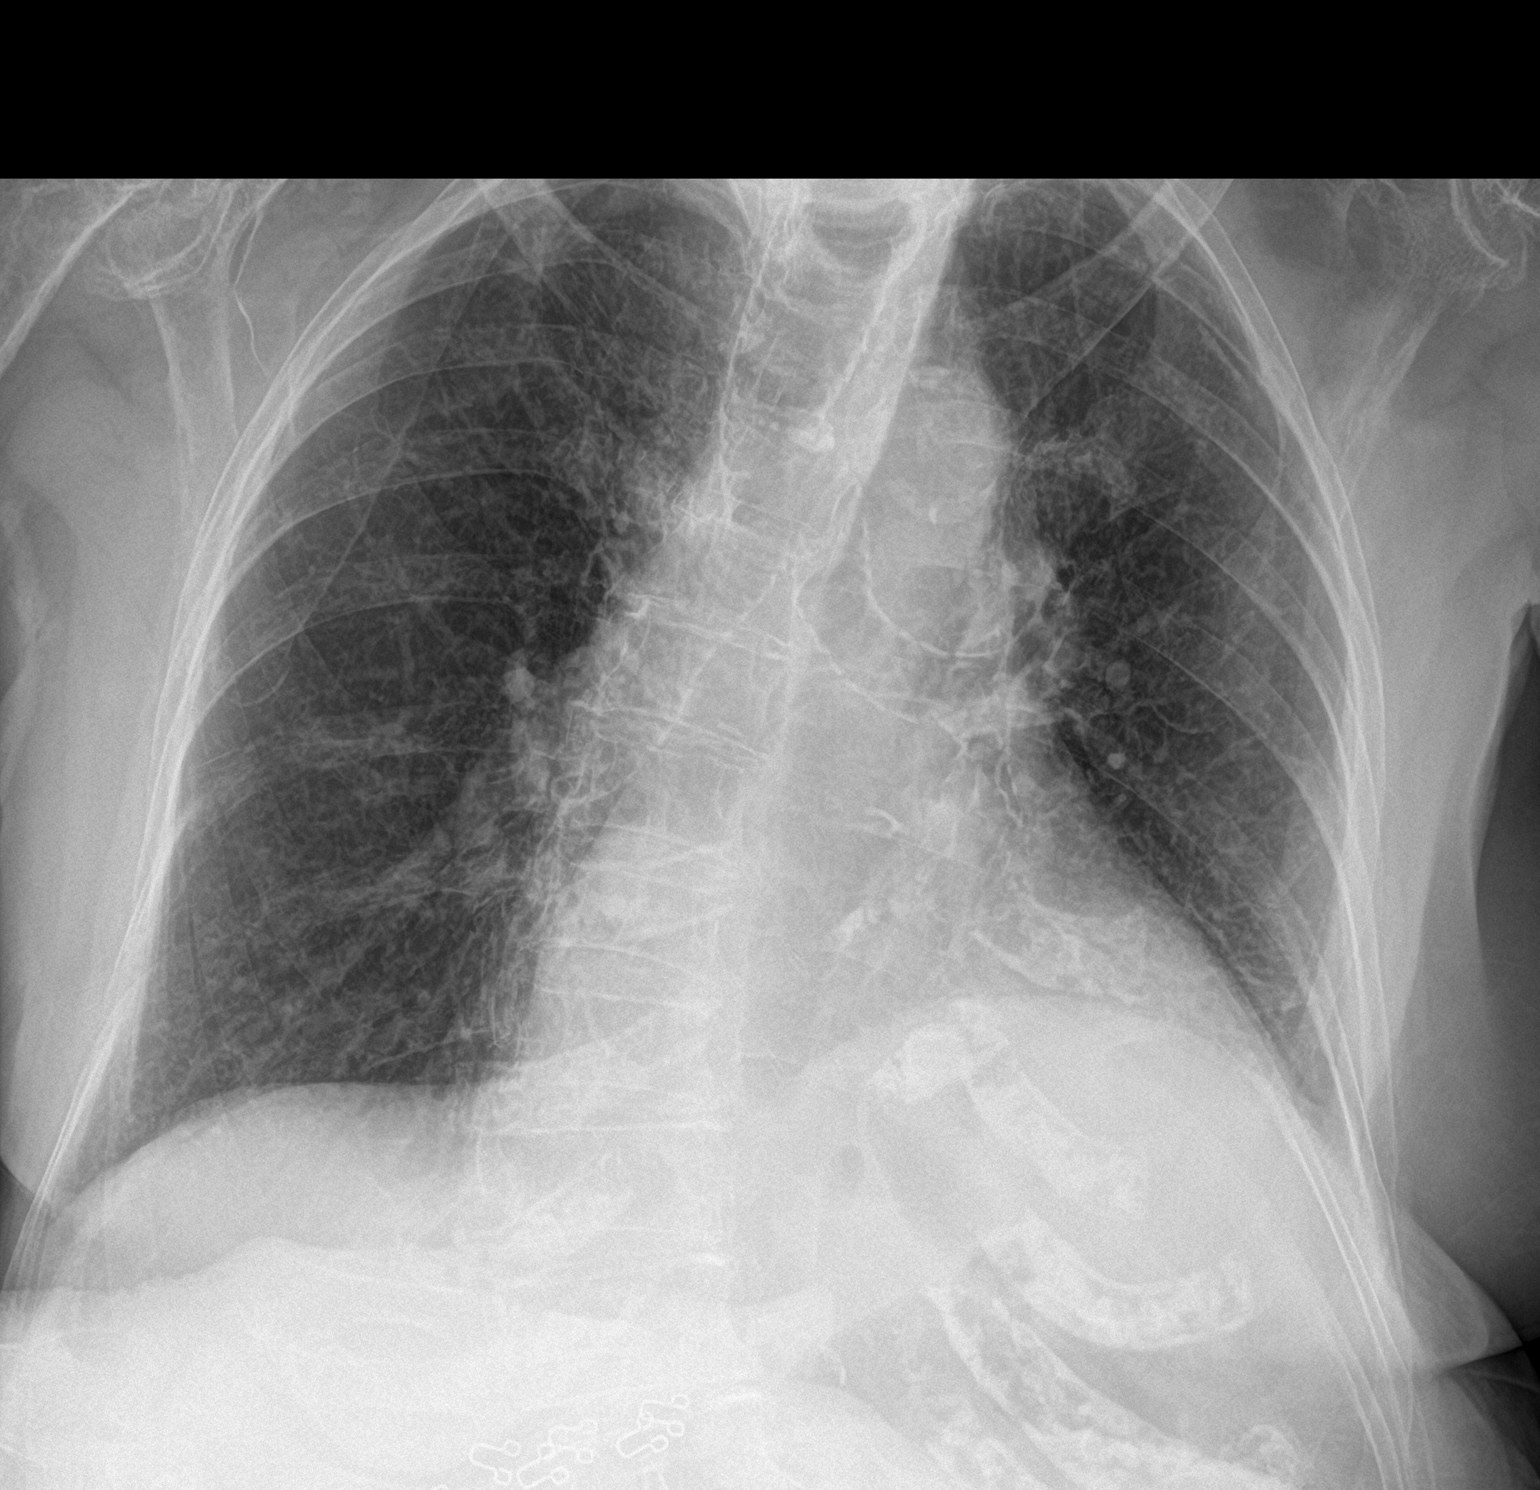

[1 of 1 positions shown; findings below may reference images not displayed]

FINDINGS: Heart is enlarged and stable in configuration. Stable elevation of
the LEFT hemidiaphragm. Lungs are clear. No pneumothorax. No acute
displaced fractures. Chronic changes in both shoulders, RIGHT
greater than LEFT.
IMPRESSION: Stable cardiomegaly.  No evidence for acute  abnormality.

## 2023-06-01 IMAGING — DX DG PORTABLE PELVIS
1 series · 1 of 1 positions shown · non-contrast
Comparison: Preoperative radiograph yesterday.

CLINICAL DATA: Postop left hip arthroplasty.

EXAM:
PORTABLE PELVIS 1-2 VIEWS

[pelvis ap]
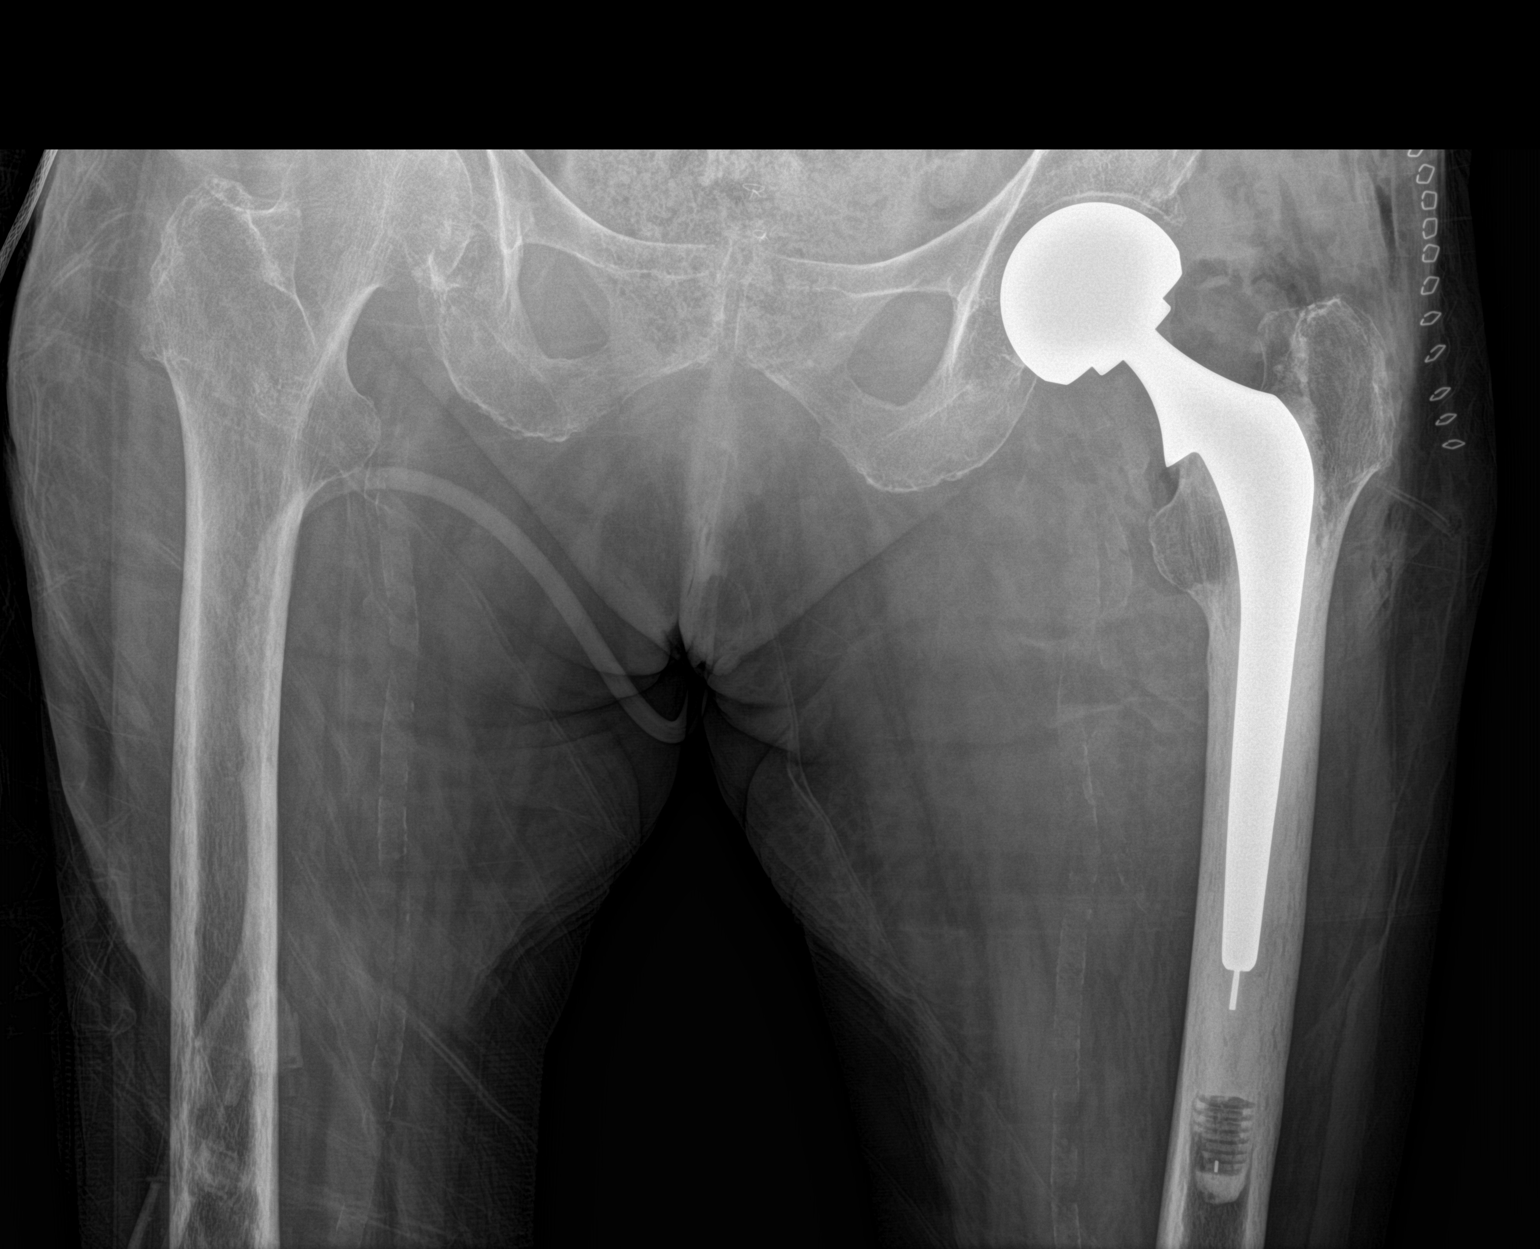

[1 of 1 positions shown; findings below may reference images not displayed]

FINDINGS: Left hip arthroplasty in expected alignment. There is no
periprosthetic lucency or fracture. Recent postsurgical change
includes air and edema in the soft tissues as well as lateral skin
staples. The bones are under mineralized
IMPRESSION: Left hip arthroplasty without immediate postoperative complication.
